# Patient Record
Sex: Male | Born: 1960
Health system: Southern US, Community
[De-identification: ages and names within clinical notes are randomized; demographics above are authoritative.]

## PROBLEM LIST (undated history)

## (undated) DIAGNOSIS — I219 Acute myocardial infarction, unspecified: Secondary | ICD-10-CM

## (undated) DIAGNOSIS — R002 Palpitations: Secondary | ICD-10-CM

## (undated) DIAGNOSIS — T7840XA Allergy, unspecified, initial encounter: Secondary | ICD-10-CM

## (undated) HISTORY — DX: Palpitations: R00.2

## (undated) HISTORY — DX: Allergy, unspecified, initial encounter: T78.40XA

---

## 2017-08-28 ENCOUNTER — Other Ambulatory Visit: Payer: Self-pay

## 2017-08-28 ENCOUNTER — Encounter: Payer: Self-pay | Admitting: Physician Assistant

## 2017-08-28 ENCOUNTER — Ambulatory Visit (INDEPENDENT_AMBULATORY_CARE_PROVIDER_SITE_OTHER): Payer: Self-pay

## 2017-08-28 ENCOUNTER — Ambulatory Visit (INDEPENDENT_AMBULATORY_CARE_PROVIDER_SITE_OTHER): Payer: Self-pay | Admitting: Physician Assistant

## 2017-08-28 VITALS — BP 132/70 | HR 105 | Temp 98.8°F | Resp 18 | Ht 70.0 in | Wt 199.0 lb

## 2017-08-28 DIAGNOSIS — R059 Cough, unspecified: Secondary | ICD-10-CM

## 2017-08-28 DIAGNOSIS — J209 Acute bronchitis, unspecified: Secondary | ICD-10-CM

## 2017-08-28 DIAGNOSIS — R062 Wheezing: Secondary | ICD-10-CM

## 2017-08-28 DIAGNOSIS — Z720 Tobacco use: Secondary | ICD-10-CM

## 2017-08-28 DIAGNOSIS — R05 Cough: Secondary | ICD-10-CM

## 2017-08-28 DIAGNOSIS — Z113 Encounter for screening for infections with a predominantly sexual mode of transmission: Secondary | ICD-10-CM

## 2017-08-28 LAB — POCT CBC
GRANULOCYTE PERCENT: 67.6 % (ref 37–80)
HEMATOCRIT: 56.6 % — AB (ref 43.5–53.7)
Hemoglobin: 18.9 g/dL — AB (ref 14.1–18.1)
Lymph, poc: 2.2 (ref 0.6–3.4)
MCH: 32.6 pg — AB (ref 27–31.2)
MCHC: 33.4 g/dL (ref 31.8–35.4)
MCV: 97.7 fL — AB (ref 80–97)
MID (CBC): 0.3 (ref 0–0.9)
MPV: 7.3 fL (ref 0–99.8)
PLATELET COUNT, POC: 252 10*3/uL (ref 142–424)
POC GRANULOCYTE: 5.3 (ref 2–6.9)
POC LYMPH %: 28.3 % (ref 10–50)
POC MID %: 4.1 %M (ref 0–12)
RBC: 5.8 M/uL (ref 4.69–6.13)
RDW, POC: 13.2 %
WBC: 7.8 10*3/uL (ref 4.6–10.2)

## 2017-08-28 MED ORDER — IPRATROPIUM BROMIDE HFA 17 MCG/ACT IN AERS: 2.0000 | INHALATION_SPRAY | RESPIRATORY_TRACT | 0 refills | Status: DC | PRN

## 2017-08-28 MED ORDER — BENZONATATE 100 MG PO CAPS: 100.0000 mg | ORAL_CAPSULE | Freq: Three times a day (TID) | ORAL | 0 refills | Status: DC | PRN

## 2017-08-28 MED ORDER — IPRATROPIUM BROMIDE 0.02 % IN SOLN
0.5000 mg | Freq: Once | RESPIRATORY_TRACT | Status: AC
  Administered 2017-08-28: 0.5 mg via RESPIRATORY_TRACT

## 2017-08-28 MED ORDER — ALBUTEROL SULFATE HFA 108 (90 BASE) MCG/ACT IN AERS: 2.0000 | INHALATION_SPRAY | RESPIRATORY_TRACT | 1 refills | Status: DC | PRN

## 2017-08-28 MED ORDER — ALBUTEROL SULFATE (2.5 MG/3ML) 0.083% IN NEBU
2.5000 mg | INHALATION_SOLUTION | Freq: Once | RESPIRATORY_TRACT | Status: AC
  Administered 2017-08-28: 2.5 mg via RESPIRATORY_TRACT

## 2017-08-28 MED ORDER — AZITHROMYCIN 250 MG PO TABS: ORAL_TABLET | ORAL | 0 refills | Status: DC

## 2017-08-28 MED ORDER — PREDNISONE 20 MG PO TABS: 40.0000 mg | ORAL_TABLET | Freq: Every day | ORAL | 0 refills | Status: AC

## 2017-08-28 NOTE — Progress Notes (Signed)
MRN: [id number] DOB: 1961/04/03  Subjective:   Barry Gutierrez is a 56 y.o. male presenting for chief complaint of Cough (x about 2 weeks); Nasal Congestion; and Exposure to STD (wants to be tested ) .  Reports 1.5 week history of mostly dry hacking cough. Will occasionally produce phlegm. Has associated runny nose. It all started with sneezing. Has associated wheezing and SOB if he coughs too much.  Denies fever, night sweats, hemoptysis, chest pain, sore throat,  nausea, vomiting, abdominal pain and diarrhea. Has tried mucinex and "over the counter everything" with no full relief. Has had sick contact with girlfriend. Has history of seasonal allergies, no history of asthma or COPD. Patient has not had flu shot this season. Current every day smoker, smokes 2 packs a day x 40 years. Drinks alcohol. Will drink beer and shots every night. Has hx of bronchitis. Denies lower leg swelling, DOE, heart palpitations, recent hospitalizations or travel. No PMH of cancer or heart disease.   Pt would like to be tested for STDs.  He has just recently started dating a new partner and has not had STD testing in over 25 years.  He is asymptomatic.  He has not had any exposures that he is aware of.  Denies penile discharge, penile pain, testicular swelling, testicular pain, rectal pain, dysuria, urinary frequency, and urinary hesitancy.  Kaspar currently has no medications in their medication list. Also has No Known Allergies.  Graycen  has a past medical history of Allergy. Also  has no past surgical history on file.   Objective:   Vitals: BP 132/70   Pulse (!) 105   Temp 98.8 F (37.1 C) (Oral)   Resp 18   Ht 5\' 10"  (1.778 m)   Wt 199 lb (90.3 kg)   SpO2 96%   PF 450 L/min   BMI 28.55 kg/m   Physical Exam  Constitutional: He is oriented to person, place, and time. He appears well-developed and well-nourished. No distress.  HENT:  Head: Normocephalic and atraumatic.  Right Ear: Tympanic membrane,  external ear and ear canal normal.  Left Ear: Tympanic membrane, external ear and ear canal normal.  Nose: Mucosal edema and rhinorrhea present. Right sinus exhibits no maxillary sinus tenderness and no frontal sinus tenderness. Left sinus exhibits no maxillary sinus tenderness and no frontal sinus tenderness.  Mouth/Throat: Uvula is midline and mucous membranes are normal. Posterior oropharyngeal erythema present. No tonsillar exudate.  Eyes: Conjunctivae are normal.  Neck: Normal range of motion.  Pulmonary/Chest: Effort normal. No accessory muscle usage. No respiratory distress. He has no decreased breath sounds. He has wheezes (with forced expiratory breathing). He has rhonchi (diffuse throughout bilateral lung fields, cleared with cough). He has no rales.  Lymphadenopathy:       Head (right side): No submental, no submandibular, no tonsillar, no preauricular, no posterior auricular and no occipital adenopathy present.       Head (left side): No submental, no submandibular, no tonsillar, no preauricular, no posterior auricular and no occipital adenopathy present.    He has no cervical adenopathy.       Right: No supraclavicular adenopathy present.       Left: No supraclavicular adenopathy present.  Neurological: He is alert and oriented to person, place, and time.  Skin: Skin is warm and dry.  Psychiatric: He has a normal mood and affect.  Vitals reviewed.   Results for orders placed or performed in visit on 08/28/17 (from the past 24 hour(s))  POCT CBC     Status: Abnormal   Collection Time: 08/28/17  2:46 PM  Result Value Ref Range   WBC 7.8 4.6 - 10.2 K/uL   Lymph, poc 2.2 0.6 - 3.4   POC LYMPH PERCENT 28.3 10 - 50 %L   MID (cbc) 0.3 0 - 0.9   POC MID % 4.1 0 - 12 %M   POC Granulocyte 5.3 2 - 6.9   Granulocyte percent 67.6 37 - 80 %G   RBC 5.80 4.69 - 6.13 M/uL   Hemoglobin 18.9 (A) 14.1 - 18.1 g/dL   HCT, POC 15.156.6 (A) 76.143.5 - 53.7 %   MCV 97.7 (A) 80 - 97 fL   MCH, POC 32.6  (A) 27 - 31.2 pg   MCHC 33.4 31.8 - 35.4 g/dL   RDW, POC 60.713.2 %   Platelet Count, POC 252 142 - 424 K/uL   MPV 7.3 0 - 99.8 fL    Dg Chest 2 View  Result Date: 08/28/2017 CLINICAL DATA:  Cough, wheezing, shortness of breath EXAM: CHEST  2 VIEW COMPARISON:  None. FINDINGS: Lungs are clear.  No pleural effusion or pneumothorax. The heart is normal in size. Visualized osseous structures are within normal limits. IMPRESSION: Normal chest radiographs. Electronically Signed   By: Charline BillsSriyesh  Krishnan M.D.   On: 08/28/2017 14:41   Post duoneb, rhonchi noted throughout lung field, cleared with cough. Wheezing improved. Peak flow reading is 450, about 83% of predicted. Pt notes he feels less congested post duoneb.   SpO2 while ambulating is 97%. Pulse while ambulating is 102 bpm.   Assessment and Plan :  1. Cough - DG Chest 2 View; Future - POCT CBC - albuterol (PROVENTIL) (2.5 MG/3ML) 0.083% nebulizer solution 2.5 mg - ipratropium (ATROVENT) nebulizer solution 0.5 mg 2. Wheezing Improved with duoneb.  3. Acute bronchitis, unspecified organism Pt is overall well appearing. No distress. His hx and PE findings are consistent with acute bronchitis with associated bronchospasm. Rhonchi noted throughout lung exam but is cleared with cough. Pt improved with duoneb. SpO2 remains at 97% while ambulating. He is mildly tachycardic at 105bpm. WBC nml. Chest xray with no acute findings. Due to extensive smoking hx and intermittent wheezing, will treat with abx and prednisone at this time. Give Rx for albuterol and atrovent inhalers to use every 4-6 hrs prn for wheezing and SOB. Advised to return to clinic if symptoms worsen, do not improve with tx, or as needed. Strongly encouraged pt to discontinue smoking. Also encouraged him to follow up when he is feeling better for PFTs. . - azithromycin (ZITHROMAX) 250 MG tablet; Take 2 tabs PO x 1 dose, then 1 tab PO QD x 4 days  Dispense: 6 tablet; Refill: 0 - predniSONE  (DELTASONE) 20 MG tablet; Take 2 tablets (40 mg total) by mouth daily with breakfast for 5 days.  Dispense: 10 tablet; Refill: 0 - albuterol (PROVENTIL HFA;VENTOLIN HFA) 108 (90 Base) MCG/ACT inhaler; Inhale 2 puffs into the lungs every 4 (four) hours as needed for wheezing or shortness of breath (cough, shortness of breath or wheezing.).  Dispense: 1 Inhaler; Refill: 1 - ipratropium (ATROVENT HFA) 17 MCG/ACT inhaler; Inhale 2 puffs into the lungs every 4 (four) hours as needed for wheezing.  Dispense: 1 Inhaler; Refill: 0 - benzonatate (TESSALON) 100 MG capsule; Take 1-2 capsules (100-200 mg total) by mouth 3 (three) times daily as needed for cough.  Dispense: 40 capsule; Refill: 0  4. Tobacco abuse Counseling given. Pt is  not ready to quit.   5. Screen for STD (sexually transmitted disease) Asymptomatic. Labs pending.  - Hepatitis panel, acute - RPR - HIV antibody - Trichomonas vaginalis, RNA - GC/Chlamydia Probe Amp  Benjiman CoreBrittany Jatinder Mcdonagh, PA-C  Primary Care at [facility] 08/28/2017 3:47 PM

## 2017-08-28 NOTE — Patient Instructions (Addendum)
We are going to treat your infection with both prednisone and an antibiotic. Prednisone is a steroid and can cause side effects such as headache, irritability, nausea, vomiting, increased heart rate, increased blood pressure, increased blood sugar, appetite changes, and insomnia. Please take tablets in the morning with a full meal to help decrease the chances of these side effects.   I have also given you prescription for 2 inhalers.  Please use every 4-6 hours as needed for shortness of breath and wheezing.  I have given you Tessalon Perles to use it daily for cough suppression.  I recommend using over-the-counter Delsym cough syrup at nighttime.  If any of your symptoms worsen or you develop new concerning symptoms, please seek care immediately.  Thank you for letting me participate in your health and well being.  Chronic Obstructive Pulmonary Disease Exacerbation Chronic obstructive pulmonary disease (COPD) is a common lung problem. In COPD, the flow of air from the lungs is limited. COPD exacerbations are times that breathing gets worse and you need extra treatment. Without treatment they can be life threatening. If they happen often, your lungs can become more damaged. If your COPD gets worse, your doctor may treat you with:  Medicines.  Oxygen.  Different ways to clear your airway, such as using a mask.  Follow these instructions at home:  Do not smoke.  Avoid tobacco smoke and other things that bother your lungs.  If given, take your antibiotic medicine as told. Finish the medicine even if you start to feel better.  Only take medicines as told by your doctor.  Drink enough fluids to keep your pee (urine) clear or pale yellow (unless your doctor has told you not to).  Use a cool mist machine (vaporizer).  If you use oxygen or a machine that turns liquid medicine into a mist (nebulizer), continue to use them as told.  Keep up with shots (vaccinations) as told by your  doctor.  Exercise regularly.  Eat healthy foods.  Keep all doctor visits as told. Get help right away if:  You are very short of breath and it gets worse.  You have trouble talking.  You have bad chest pain.  You have blood in your spit (sputum).  You have a fever.  You keep throwing up (vomiting).  You feel weak, or you pass out (faint).  You feel confused.  You keep getting worse. This information is not intended to replace advice given to you by your health care provider. Make sure you discuss any questions you have with your health care provider. Document Released: 08/08/2011 Document Revised: 01/25/2016 Document Reviewed: 04/23/2013 Elsevier Interactive Patient Education  2017 [employer]  Steps to Quit Smoking Smoking tobacco can be bad for your health. It can also affect almost every organ in your body. Smoking puts you and people around you at risk for many serious long-lasting (chronic) diseases. Quitting smoking is hard, but it is one of the best things that you can do for your health. It is never too late to quit. What are the benefits of quitting smoking? When you quit smoking, you lower your risk for getting serious diseases and conditions. They can include:  Lung cancer or lung disease.  Heart disease.  Stroke.  Heart attack.  Not being able to have children (infertility).  Weak bones (osteoporosis) and broken bones (fractures).  If you have coughing, wheezing, and shortness of breath, those symptoms may get better when you quit. You may also get sick less  often. If you are pregnant, quitting smoking can help to lower your chances of having a baby of low birth weight. What can I do to help me quit smoking? Talk with your doctor about what can help you quit smoking. Some things you can do (strategies) include:  Quitting smoking totally, instead of slowly cutting back how much you smoke over a period of time.  Going to in-person counseling. You are  more likely to quit if you go to many counseling sessions.  Using resources and support systems, such as: ? Agricultural engineernline chats with a Veterinary surgeoncounselor. ? Phone quitlines. ? Automotive engineerrinted self-help materials. ? Support groups or group counseling. ? Text messaging programs. ? Mobile phone apps or applications.  Taking medicines. Some of these medicines may have nicotine in them. If you are pregnant or breastfeeding, do not take any medicines to quit smoking unless your doctor says it is okay. Talk with your doctor about counseling or other things that can help you.  Talk with your doctor about using more than one strategy at the same time, such as taking medicines while you are also going to in-person counseling. This can help make quitting easier. What things can I do to make it easier to quit? Quitting smoking might feel very hard at first, but there is a lot that you can do to make it easier. Take these steps:  Talk to your family and friends. Ask them to support and encourage you.  Call phone quitlines, reach out to support groups, or work with a Veterinary surgeoncounselor.  Ask people who smoke to not smoke around you.  Avoid places that make you want (trigger) to smoke, such as: ? Bars. ? Parties. ? Smoke-break areas at work.  Spend time with people who do not smoke.  Lower the stress in your life. Stress can make you want to smoke. Try these things to help your stress: ? Getting regular exercise. ? Deep-breathing exercises. ? Yoga. ? Meditating. ? Doing a body scan. To do this, close your eyes, focus on one area of your body at a time from head to toe, and notice which parts of your body are tense. Try to relax the muscles in those areas.  Download or buy apps on your mobile phone or tablet that can help you stick to your quit plan. There are many free apps, such as QuitGuide from the [employer] developer(Centers for Disease Control and Prevention). You can find more support from smokefree.gov and other websites.  This  information is not intended to replace advice given to you by your health care provider. Make sure you discuss any questions you have with your health care provider. Document Released: 06/15/2009 Document Revised: 04/16/2016 Document Reviewed: 01/03/2015 Elsevier Interactive Patient Education  2018 [employer]     IF you received an x-ray today, you will receive an invoice from [facility]. Please contact [facility] at [phone] with questions or concerns regarding your invoice.   IF you received labwork today, you will receive an invoice from [facility]. Please contact [facility] at [phone] with questions or concerns regarding your invoice.   Our billing staff will not be able to assist you with questions regarding bills from these companies.  You will be contacted with the lab results as soon as they are available. The fastest way to get your results is to activate your My Chart account. Instructions are located on the last page of this paperwork. If you have not heard from us regarding the results in 2  weeks, please contact this office.

## 2017-08-29 LAB — HEPATITIS PANEL, ACUTE
HEP A IGM: NEGATIVE
HEP B S AG: NEGATIVE
Hep B C IgM: NEGATIVE
Hep C Virus Ab: <0.1 s/co ratio (ref 0.0–0.9)

## 2017-08-29 LAB — RPR: RPR Ser Ql: NONREACTIVE

## 2017-08-29 LAB — HIV ANTIBODY (ROUTINE TESTING W REFLEX): HIV SCREEN 4TH GENERATION: NONREACTIVE

## 2017-08-30 LAB — GC/CHLAMYDIA PROBE AMP
Chlamydia trachomatis, NAA: NEGATIVE
Neisseria gonorrhoeae by PCR: NEGATIVE

## 2017-08-30 LAB — TRICHOMONAS VAGINALIS, PROBE AMP: Trich vag by NAA: NEGATIVE

## 2017-09-04 ENCOUNTER — Telehealth: Payer: Self-pay | Admitting: Physician Assistant

## 2017-09-04 NOTE — Telephone Encounter (Signed)
Gave the patient lab results.     He states he is not feeling any better after finishing antibiotic.   Would you like to see him again?

## 2017-09-04 NOTE — Telephone Encounter (Signed)
Copied from CRM #[id number]. Topic: Quick Communication - See Telephone Encounter >> Sep 04, 2017 11:29 AM Clack, Princella PellegriniJessica D wrote: CRM for notification. See Telephone encounter for: Pt calling back for lab results and he states his cough is not getting any better.  09/04/17.

## 2017-09-04 NOTE — Telephone Encounter (Signed)
Please call pt. If he is not feeling any better, he will need to be seen again. I am out of the office until Monday. He can see any of my colleagues on Friday or Saturday.Thanks!

## 2017-09-05 NOTE — Telephone Encounter (Signed)
Left detailed message.   

## 2017-09-10 NOTE — Telephone Encounter (Signed)
Copied from CRM #[id number]. Topic: Inquiry >> Sep 09, 2017  4:32 PM Vivia EwingWalser, Michelle A wrote: Reason for CRM: Pt. Called requesting printed copy of recent negative STD screening. Pt. Requested to pick up copy of printed results from the practice. Please return call pt. To inform him when results are printed and ready for him to pick up.

## 2017-10-14 ENCOUNTER — Other Ambulatory Visit: Payer: Self-pay

## 2017-10-14 ENCOUNTER — Encounter: Payer: Self-pay | Admitting: Family Medicine

## 2017-10-14 ENCOUNTER — Ambulatory Visit (INDEPENDENT_AMBULATORY_CARE_PROVIDER_SITE_OTHER): Payer: 59 | Admitting: Family Medicine

## 2017-10-14 VITALS — BP 118/78 | HR 97 | Temp 98.9°F | Ht 70.0 in | Wt 201.0 lb

## 2017-10-14 DIAGNOSIS — R05 Cough: Secondary | ICD-10-CM

## 2017-10-14 DIAGNOSIS — R062 Wheezing: Secondary | ICD-10-CM | POA: Diagnosis not present

## 2017-10-14 DIAGNOSIS — Z720 Tobacco use: Secondary | ICD-10-CM

## 2017-10-14 DIAGNOSIS — R059 Cough, unspecified: Secondary | ICD-10-CM

## 2017-10-14 MED ORDER — IPRATROPIUM BROMIDE 0.02 % IN SOLN
0.5000 mg | Freq: Once | RESPIRATORY_TRACT | Status: AC
  Administered 2017-10-14: 0.5 mg via RESPIRATORY_TRACT

## 2017-10-14 MED ORDER — ALBUTEROL SULFATE (2.5 MG/3ML) 0.083% IN NEBU
2.5000 mg | INHALATION_SOLUTION | Freq: Once | RESPIRATORY_TRACT | Status: AC
  Administered 2017-10-14: 2.5 mg via RESPIRATORY_TRACT

## 2017-10-14 MED ORDER — PREDNISONE 10 MG PO TABS: ORAL_TABLET | ORAL | 0 refills | Status: AC

## 2017-10-14 NOTE — Patient Instructions (Signed)
     IF you received an x-ray today, you will receive an invoice from [facility]. Please contact [facility] at [phone] with questions or concerns regarding your invoice.   IF you received labwork today, you will receive an invoice from [facility]. Please contact [facility] at [phone] with questions or concerns regarding your invoice.   Our billing staff will not be able to assist you with questions regarding bills from these companies.  You will be contacted with the lab results as soon as they are available. The fastest way to get your results is to activate your My Chart account. Instructions are located on the last page of this paperwork. If you have not heard from us regarding the results in 2 weeks, please contact this office.     

## 2017-10-14 NOTE — Progress Notes (Signed)
2/12/20191:45 PM  Barry Gutierrez June 18, 1961, 57 y.o. male [id number]  Chief Complaint  Patient presents with  . Follow-up    NOT FEELING ANY BETTER FROM LAST VISIT  . Medication Refill    NEEDS ALBUTEROL REFILL    HPI:   Patient is a 57 y.o. male who presents today for continued mostly non productive cough for the past 2 months. He occasionally with cough thick mucous, most of the time dry, wheezing. Reports occasional SOB. Completed abx wo much benefit. Normally has no respiratory issues. Last bronchitis was about 3 years ago. He has occasional sneezing and PND but not constant and none of recent. Denies any fever or chills. Denies heartburn.  Seen on 08/28/17, prescribed azithromycin and tessalon pearls, also given albuterol and ipratropium inhalers Did not get ipratropium as it was too expensive and he did not have insurance He does not feel that albuterol helps much CXR done at that time was normal Smokes 2 ppd  Depression screen [facility] 2/9 10/14/2017 08/28/2017  Decreased Interest 0 0  Down, Depressed, Hopeless 0 0  PHQ - 2 Score 0 0    No Known Allergies  Prior to Admission medications   Medication Sig Start Date End Date Taking? Authorizing Provider  albuterol (PROVENTIL HFA;VENTOLIN HFA) 108 (90 Base) MCG/ACT inhaler Inhale 2 puffs into the lungs every 4 (four) hours as needed for wheezing or shortness of breath (cough, shortness of breath or wheezing.). 08/28/17  Yes Barnett Abu, [place] D, PA-C  ipratropium (ATROVENT HFA) 17 MCG/ACT inhaler Inhale 2 puffs into the lungs every 4 (four) hours as needed for wheezing. 08/28/17  Yes Magdalene River, PA-C    Past Medical History:  Diagnosis Date  . Allergy     History reviewed. No pertinent surgical history.  Social History   Tobacco Use  . Smoking status: Current Every Day Smoker    Types: Cigarettes  . Smokeless tobacco: Never Used  Substance Use Topics  . Alcohol use: Yes    Family History  Problem  Relation Age of Onset  . Cancer Mother   . Diabetes Father     ROS Per hpi  OBJECTIVE:  Blood pressure 118/78, pulse 97, temperature 98.9 F (37.2 C), temperature source Oral, height 5\' 10"  (1.778 m), weight 201 lb (91.2 kg), SpO2 98 %.  Physical Exam  Constitutional: He is oriented to person, place, and time and well-developed, well-nourished, and in no distress.  HENT:  Head: Normocephalic and atraumatic.  Right Ear: Hearing, tympanic membrane, external ear and ear canal normal.  Left Ear: Hearing, tympanic membrane, external ear and ear canal normal.  Mouth/Throat: Oropharynx is clear and moist. No oropharyngeal exudate.  Eyes: Conjunctivae and EOM are normal. Pupils are equal, round, and reactive to light.  Neck: Neck supple.  Cardiovascular: Normal rate and regular rhythm. Exam reveals no gallop and no friction rub.  No murmur heard. Pulmonary/Chest: Effort normal. He has wheezes (diffuse). He has rhonchi (diffuse). He has no rales.  Lymphadenopathy:    He has no cervical adenopathy.  Neurological: He is alert and oriented to person, place, and time. Gait normal.  Skin: Skin is warm and dry.    Office Spirometry Results: PRE Peak Flow: 71 L/min FEV1: 2.66 liters FVC: 3.56 liters FEV1/FVC: 74.7 % FVC  % Predicted: 73 % FEV % Predicted: 71 % FeF 25-75: 2.17 liters FeF 25-75 % Predicted: 67   Office Spirometry Results:  POST Peak Flow: 71 L/min FEV1: 2.41 liters FVC: 3.45 liters  FEV1/FVC: 69.9 % FVC  % Predicted: 70 % FEV % Predicted: 64 % FeF 25-75: 1.79 liters FeF 25-75 % Predicted: 55    ASSESSMENT and PLAN  1. Cough Patient with about 80 pack year history of smoking, not improved after treatment with azithromycin, normal cxr and spirometry suggestive of moderate obstruction however done during time of illness, therefore treating with oral steroids. New meds r/se/b discussed. RTC precautions reviewed.  - albuterol (PROVENTIL) (2.5 MG/3ML) 0.083% nebulizer  solution 2.5 mg - ipratropium (ATROVENT) nebulizer solution 0.5 mg - Care order/instruction:  2. Wheezing - albuterol (PROVENTIL) (2.5 MG/3ML) 0.083% nebulizer solution 2.5 mg - ipratropium (ATROVENT) nebulizer solution 0.5 mg - Care order/instruction:  3. Tobacco abuse Strongly encouraged cutting back/quiting.   Other orders - predniSONE (DELTASONE) 10 MG tablet; Take 4 tablets (40 mg total) by mouth daily with breakfast for 3 days, THEN 2 tablets (20 mg total) daily with breakfast for 3 days, THEN 1 tablet (10 mg total) daily with breakfast for 3 days, THEN 0.5 tablets (5 mg total) daily with breakfast for 3 days.  Return in about 3 weeks (around 11/04/2017).    Myles LippsIrma M Santiago, MD [facility] at [facility] [address] [place] [id number] Ph.  [phone] Fax [phone]

## 2017-10-20 ENCOUNTER — Telehealth: Payer: Self-pay | Admitting: Family Medicine

## 2017-10-20 NOTE — Telephone Encounter (Signed)
Copied from CRM [phone]. Topic: Quick Communication - See Telephone Encounter >> Oct 20, 2017  8:48 AM Guinevere FerrariMorris, Phillp Dolores E, NT wrote: CRM for notification. See Telephone encounter for: Patient called and said he was just seen in the office last week for a cough and is now running a fever.Pt does not know how high his fever is and is not feeling any better and said he can't keep coming in. Patient would like a call back.  10/20/17.

## 2017-10-21 ENCOUNTER — Other Ambulatory Visit: Payer: Self-pay

## 2017-10-21 ENCOUNTER — Ambulatory Visit (INDEPENDENT_AMBULATORY_CARE_PROVIDER_SITE_OTHER): Payer: 59 | Admitting: Family Medicine

## 2017-10-21 ENCOUNTER — Encounter: Payer: Self-pay | Admitting: Family Medicine

## 2017-10-21 VITALS — BP 140/82 | HR 104 | Temp 99.7°F | Ht 71.0 in | Wt 200.0 lb

## 2017-10-21 DIAGNOSIS — Z720 Tobacco use: Secondary | ICD-10-CM

## 2017-10-21 DIAGNOSIS — R509 Fever, unspecified: Secondary | ICD-10-CM

## 2017-10-21 DIAGNOSIS — R05 Cough: Secondary | ICD-10-CM

## 2017-10-21 DIAGNOSIS — R0602 Shortness of breath: Secondary | ICD-10-CM | POA: Diagnosis not present

## 2017-10-21 DIAGNOSIS — R059 Cough, unspecified: Secondary | ICD-10-CM

## 2017-10-21 LAB — POC INFLUENZA A&B (BINAX/QUICKVUE)
Influenza A, POC: NEGATIVE
Influenza B, POC: NEGATIVE

## 2017-10-21 MED ORDER — GUAIFENESIN-CODEINE 100-10 MG/5ML PO SOLN: 5.0000 mL | Freq: Three times a day (TID) | ORAL | 0 refills | Status: DC | PRN

## 2017-10-21 MED ORDER — LEVOFLOXACIN 500 MG PO TABS: 500.0000 mg | ORAL_TABLET | Freq: Every day | ORAL | 0 refills | Status: DC

## 2017-10-21 NOTE — Patient Instructions (Signed)
     IF you received an x-ray today, you will receive an invoice from [facility]. Please contact [facility] at [phone] with questions or concerns regarding your invoice.   IF you received labwork today, you will receive an invoice from [facility]. Please contact [facility] at [phone] with questions or concerns regarding your invoice.   Our billing staff will not be able to assist you with questions regarding bills from these companies.  You will be contacted with the lab results as soon as they are available. The fastest way to get your results is to activate your My Chart account. Instructions are located on the last page of this paperwork. If you have not heard from us regarding the results in 2 weeks, please contact this office.     

## 2017-10-21 NOTE — Telephone Encounter (Signed)
Yes he needs to be re-evaluated. He was first seen for URI sx in late January. He has been treated with azithromycin and prednisone and he is getting worse. I wonder if he has the flu.Marland Kitchen.Marland Kitchen.Marland Kitchen.Thanks

## 2017-10-21 NOTE — Telephone Encounter (Signed)
Do you want to see patient

## 2017-10-21 NOTE — Progress Notes (Signed)
2/19/20193:43 PM  Barry Gutierrez Sep 28, 1960, 57 y.o. male [id number]  Chief Complaint  Patient presents with  . Fever    has not been feeling well for the past 2 months    HPI:   Patient is a 57 y.o. male with past medical history significant for smoking who presents today for non resolving minimally productive cough. He felt feverish 2-3 days ago, today feeling a bit better. But overall cough and SOB have been unchanged despite treatment with oral prednisone and azithromycin. He states that he never gets sick. Last time he had a bronchitis was about 3 years ago, and it did not last this long. He is tired, albuterol not really helping either. Has cut down on smoking since he been coughing so much.   Seen in 10/14/17, treated with prednisone 08/28/17, normal CXR, CBC, peak flow 450 about 83% of predicated, treated with azithyromycin  Depression screen [facility] 2/9 10/21/2017 10/14/2017 08/28/2017  Decreased Interest 0 0 0  Down, Depressed, Hopeless 0 0 0  PHQ - 2 Score 0 0 0    No Known Allergies  Prior to Admission medications   Medication Sig Start Date End Date Taking? Authorizing Provider  albuterol (PROVENTIL HFA;VENTOLIN HFA) 108 (90 Base) MCG/ACT inhaler Inhale 2 puffs into the lungs every 4 (four) hours as needed for wheezing or shortness of breath (cough, shortness of breath or wheezing.). 08/28/17  Yes Barnett Abu, [place] D, PA-C  predniSONE (DELTASONE) 10 MG tablet Take 4 tablets (40 mg total) by mouth daily with breakfast for 3 days, THEN 2 tablets (20 mg total) daily with breakfast for 3 days, THEN 1 tablet (10 mg total) daily with breakfast for 3 days, THEN 0.5 tablets (5 mg total) daily with breakfast for 3 days. 10/14/17 10/26/17 Yes Myles Lipps, MD  ipratropium (ATROVENT HFA) 17 MCG/ACT inhaler Inhale 2 puffs into the lungs every 4 (four) hours as needed for wheezing. Patient not taking: Reported on 10/21/2017 08/28/17   Dwain Sarna    Past Medical  History:  Diagnosis Date  . Allergy     History reviewed. No pertinent surgical history.  Social History   Tobacco Use  . Smoking status: Current Every Day Smoker    Types: Cigarettes  . Smokeless tobacco: Never Used  Substance Use Topics  . Alcohol use: Yes    Family History  Problem Relation Age of Onset  . Cancer Mother   . Diabetes Father     Review of Systems  Constitutional: Positive for fever and malaise/fatigue. Negative for chills and weight loss.  HENT: Negative for ear pain, nosebleeds and sore throat.   Respiratory: Positive for cough, shortness of breath and wheezing. Negative for hemoptysis.   Cardiovascular: Negative for chest pain, palpitations and leg swelling.     OBJECTIVE:  Blood pressure 140/82, pulse (!) 104, temperature 99.7 F (37.6 C), temperature source Oral, height 5\' 11"  (1.803 m), weight 200 lb (90.7 kg), SpO2 96 %.  Physical Exam  Constitutional: He is oriented to person, place, and time and well-developed, well-nourished, and in no distress.  HENT:  Head: Normocephalic and atraumatic.  Right Ear: Hearing, tympanic membrane, external ear and ear canal normal.  Left Ear: Hearing, tympanic membrane, external ear and ear canal normal.  Mouth/Throat: Oropharynx is clear and moist. No oropharyngeal exudate.  Eyes: Conjunctivae and EOM are normal. Pupils are equal, round, and reactive to light.  Neck: Neck supple.  Cardiovascular: Normal rate and regular rhythm. Exam reveals no  gallop and no friction rub.  No murmur heard. Pulmonary/Chest: Effort normal. He has wheezes (diffuse). He has rhonchi (diffuse). He has no rales.  Lymphadenopathy:    He has no cervical adenopathy.  Neurological: He is alert and oriented to person, place, and time. Gait normal.  Skin: Skin is warm and dry.    Results for orders placed or performed in visit on 10/21/17 (from the past 24 hour(s))  POC Influenza A&B(BINAX/QUICKVUE)     Status: Normal   Collection  Time: 10/21/17  3:46 PM  Result Value Ref Range   Influenza A, POC Negative Negative   Influenza B, POC Negative Negative    ASSESSMENT and PLAN  1. Cough Unclear etiology, will escalate abx, ordering ct scan given smoker and refer to pulmonolgy for further evaluation and management.  - Ambulatory referral to Pulmonology - CT Chest Wo Contrast; Future  2. Tobacco abuse - Ambulatory referral to Pulmonology - CT Chest Wo Contrast; Future  3. Fever, unspecified - POC Influenza A&B(BINAX/QUICKVUE)  4. SOB (shortness of breath) - CT Chest Wo Contrast; Future  Other orders - levofloxacin (LEVAQUIN) 500 MG tablet; Take 1 tablet (500 mg total) by mouth daily. - guaiFENesin-codeine 100-10 MG/5ML syrup; Take 5 mLs by mouth 3 (three) times daily as needed for cough.  Return if symptoms worsen or fail to improve.    Myles LippsIrma M Santiago, MD Primary Care at [facility] [address] [place], [place] [address] Ph.  [phone] Fax [phone]

## 2017-11-12 ENCOUNTER — Encounter: Payer: Self-pay | Admitting: Internal Medicine

## 2017-11-12 ENCOUNTER — Ambulatory Visit (INDEPENDENT_AMBULATORY_CARE_PROVIDER_SITE_OTHER): Payer: 59 | Admitting: Internal Medicine

## 2017-11-12 ENCOUNTER — Ambulatory Visit (INDEPENDENT_AMBULATORY_CARE_PROVIDER_SITE_OTHER)
Admission: RE | Admit: 2017-11-12 | Discharge: 2017-11-12 | Disposition: A | Discharge status: Home / Self Care | Payer: 59 | Source: Ambulatory Visit | Attending: Internal Medicine | Admitting: Internal Medicine

## 2017-11-12 VITALS — BP 140/88 | HR 97 | Ht 70.0 in | Wt 200.0 lb

## 2017-11-12 DIAGNOSIS — R05 Cough: Secondary | ICD-10-CM | POA: Diagnosis not present

## 2017-11-12 DIAGNOSIS — F17210 Nicotine dependence, cigarettes, uncomplicated: Secondary | ICD-10-CM

## 2017-11-12 DIAGNOSIS — J4521 Mild intermittent asthma with (acute) exacerbation: Secondary | ICD-10-CM | POA: Diagnosis not present

## 2017-11-12 DIAGNOSIS — J45901 Unspecified asthma with (acute) exacerbation: Secondary | ICD-10-CM | POA: Insufficient documentation

## 2017-11-12 DIAGNOSIS — R058 Other specified cough: Secondary | ICD-10-CM

## 2017-11-12 MED ORDER — FAMOTIDINE 20 MG PO TABS: ORAL_TABLET | ORAL | 2 refills | Status: DC

## 2017-11-12 MED ORDER — AMOXICILLIN-POT CLAVULANATE 875-125 MG PO TABS: 1.0000 | ORAL_TABLET | Freq: Two times a day (BID) | ORAL | 0 refills | Status: AC

## 2017-11-12 MED ORDER — TRAMADOL HCL 50 MG PO TABS: ORAL_TABLET | ORAL | 0 refills | Status: DC

## 2017-11-12 MED ORDER — PREDNISONE 10 MG PO TABS
ORAL_TABLET | ORAL | 0 refills | Status: DC
Start: 2017-11-12 — End: 2018-02-05

## 2017-11-12 MED ORDER — PANTOPRAZOLE SODIUM 40 MG PO TBEC: 40.0000 mg | DELAYED_RELEASE_TABLET | Freq: Every day | ORAL | 2 refills | Status: DC

## 2017-11-12 NOTE — Patient Instructions (Signed)
Saline nasal spray as much as you can   Augmentin 875 mg take one pill twice daily  X 10 days - take at breakfast and supper with large glass of water.  It would help reduce the usual side effects (diarrhea and yeast infections) if you ate cultured yogurt at lunch and took a probiotic over the counter also   Prednisone 10 mg take  4 each am x 2 days,   2 each am x 2 days,  1 each am x 2 days and stop    Take mucinex dm 1200 every 12 hours and supplement if needed with  tramadol 50 mg up to 2 every 4 hours to suppress the urge to cough. Swallowing water or using ice chips/non mint and menthol containing candies (such as lifesavers or sugarless jolly ranchers) are also effective.  You should rest your voice and avoid activities that you know make you cough.  Once you have eliminated the cough for 3 straight days try reducing the tramadol first,  then the mucinex dm as tolerated.     Pantoprazole (protonix) 40 mg   Take  30-60 min before first meal of the day and Pepcid (famotidine)  20 mg one @  bedtime until return to office - this is the best way to tell whether stomach acid is contributing to your problem.    GERD (REFLUX)  is an extremely common cause of respiratory symptoms just like yours , many times with no obvious heartburn at all.    It can be treated with medication, but also with lifestyle changes including elevation of the head of your bed (ideally with 6 inch  bed blocks),  Smoking cessation, avoidance of late meals, excessive alcohol, and avoid fatty foods, chocolate, peppermint, colas, red wine, and acidic juices such as orange juice.  NO MINT OR MENTHOL PRODUCTS SO NO COUGH DROPS   USE SUGARLESS CANDY INSTEAD (Jolley ranchers or Stover's or Life Savers) or even ice chips will also do - the key is to swallow to prevent all throat clearing. NO OIL BASED VITAMINS - use powdered substitutes.   Please remember to go to the  x-ray department downstairs in the basement  for your tests -  we will call you with the results when they are available.      Please schedule a follow up office visit in 2 weeks, sooner if needed

## 2017-11-12 NOTE — Progress Notes (Signed)
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 Only "some better" transiently so referred to pulmonary clinic 11/12/2017 by Dr   Leretha Pol after levaquin 500 x 7 days started on 10/21/17 - finished 10/28/17 and then noted fever again 11/09/17   11/12/2017 1st [facility] office visit/ Wert   Chief Complaint  Patient presents with  . Consult    Productive cough white/tan for 84months,has had a fever but negative flu test. SOB with activity.  Cough waxed and waned since mid Dec 2018 assoc feverish/ chills/ hoarseness/ sore throat  New R ache Cough is worse at hs and in am up to a Tbsp white mucus ? Some plugs  Doe = MMRC2 = can't walk a nl pace on a flat grade s sob but does fine slow and flat      No obvious patterns in  day to day or daytime variability or assoc  mucus plugs or hemoptysis or cp or chest tightness, subjective wheeze or overt sinus or hb symptoms. No unusual exposure hx or h/o childhood pna/ asthma or knowledge of premature birth.    Also denies any obvious fluctuation of symptoms with weather or environmental changes or other aggravating or alleviating factors except as outlined above   Current Allergies, Complete Past Medical History, Past Surgical History, Family History, and Social History were reviewed in [employer] record.  ROS  The following are not active complaints unless bolded Hoarseness, sore throat, dysphagia, dental problems, itching, sneezing,  nasal congestion or discharge of excess mucus or purulent secretions, ear ache,   fever, chills, sweats, unintended wt loss or wt gain, classically pleuritic or exertional cp,  orthopnea pnd or leg swelling, presyncope, palpitations, abdominal pain, anorexia, nausea, vomiting,  diarrhea  or change in bowel habits or change in bladder habits, change in stools or change in urine, dysuria, hematuria,  rash, arthralgias, visual complaints, headache, numbness, weakness or ataxia or problems with walking or coordination,  change in mood/affect or memory.        No outpatient medications have been marked as taking for the 11/12/17 encounter (Office Visit) with Nyoka Cowden, MD.                Review of Systems     Objective:   Physical Exam  slt hoarse wm   Wt Readings from Last 3 Encounters:  11/12/17 200 lb (90.7 kg)  10/21/17 200 lb (90.7 kg)  10/14/17 201 lb (91.2 kg)     Vital signs reviewed - Note on arrival 02 sats  96% on RA          HEENT: nl dentition,   and oropharynx. Nl external ear canals without cough reflex - very severe  bilateral non-specific turbinate edema     NECK :  without JVD/Nodes/TM/ nl carotid upstrokes bilaterally   LUNGS: no acc muscle use,  Nl contour chest with insp/exp rhonchi and prominent pseudowheeze as well better with plm   CV:  RRR  no s3 or murmur or increase in P2, and no edema   ABD:  soft and nontender with nl inspiratory excursion in the supine position. No bruits or organomegaly appreciated, bowel  sounds nl  MS:  Nl gait/ ext warm without deformities, calf tenderness, cyanosis or clubbing No obvious joint restrictions   SKIN: warm and dry without lesions    NEURO:  alert, approp, nl sensorium with  no motor or cerebellar deficits apparent.     CXR PA and Lateral:   11/12/2017 :    I personally reviewed images and agree with radiology impression as follows:    No active cardiopulmonary disease        Assessment:

## 2017-11-12 NOTE — Progress Notes (Signed)
Spoke with pt and notified of results per Dr. Wert. Pt verbalized understanding and denied any questions. 

## 2017-11-13 ENCOUNTER — Encounter: Payer: Self-pay | Admitting: Internal Medicine

## 2017-11-13 DIAGNOSIS — F17210 Nicotine dependence, cigarettes, uncomplicated: Secondary | ICD-10-CM | POA: Insufficient documentation

## 2017-11-13 DIAGNOSIS — R05 Cough: Secondary | ICD-10-CM | POA: Insufficient documentation

## 2017-11-13 DIAGNOSIS — R058 Other specified cough: Secondary | ICD-10-CM | POA: Insufficient documentation

## 2017-11-13 NOTE — Assessment & Plan Note (Signed)
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

## 2017-11-13 NOTE — Assessment & Plan Note (Signed)
Of the three most common causes of  Sub-acute or recurrent or chronic cough, only one (GERD)  can actually contribute to/ trigger  the other two (asthma and post nasal drip syndrome)  and perpetuate the cylce of cough.  While not intuitively obvious, many patients with chronic low grade reflux do not cough until there is a primary insult that disturbs the protective epithelial barrier and exposes sensitive nerve endings.   This is typically viral but can be due to rhinosinusitis with pnds, which may have been the case here.     The point is that once this occurs, it is difficult to eliminate the cycle  using anything but a maximally effective acid suppression regimen at least in the short run, accompanied by an appropriate diet to address non acid GERD and control / eliminate the cough itself for at least 3 days with tramadol    Total time devoted to counseling  > 50 % of initial 60 min office visit:  review case with pt/ discussion of options/alternatives/ personally creating written customized instructions  in presence of pt  then going over those specific  Instructions directly with the pt including how to use all of the meds but in particular covering each new medication in detail and the difference between the maintenance= "automatic" meds and the prns using an action plan format for the latter (If this problem/symptom => do that organization reading Left to right).  Please see AVS from this visit for a full list of these instructions which I personally wrote for this pt and  are unique to this visit.

## 2017-11-13 NOTE — Assessment & Plan Note (Signed)

## 2017-12-08 ENCOUNTER — Ambulatory Visit: Payer: 59 | Admitting: Internal Medicine

## 2018-02-02 ENCOUNTER — Encounter: Payer: Self-pay | Admitting: Physician Assistant

## 2018-02-02 ENCOUNTER — Other Ambulatory Visit: Payer: Self-pay

## 2018-02-02 ENCOUNTER — Ambulatory Visit (INDEPENDENT_AMBULATORY_CARE_PROVIDER_SITE_OTHER): Payer: 59 | Admitting: Physician Assistant

## 2018-02-02 ENCOUNTER — Ambulatory Visit (HOSPITAL_COMMUNITY)
Admission: RE | Admit: 2018-02-02 | Discharge: 2018-02-02 | Disposition: A | Discharge status: Home / Self Care | Payer: 59 | Source: Ambulatory Visit | Attending: Physician Assistant | Admitting: Physician Assistant

## 2018-02-02 ENCOUNTER — Encounter (HOSPITAL_COMMUNITY): Payer: Self-pay

## 2018-02-02 VITALS — BP 133/84 | HR 95 | Temp 99.7°F | Ht 71.0 in | Wt 193.2 lb

## 2018-02-02 DIAGNOSIS — K5732 Diverticulitis of large intestine without perforation or abscess without bleeding: Secondary | ICD-10-CM | POA: Diagnosis not present

## 2018-02-02 DIAGNOSIS — K5792 Diverticulitis of intestine, part unspecified, without perforation or abscess without bleeding: Secondary | ICD-10-CM

## 2018-02-02 DIAGNOSIS — I700 Atherosclerosis of aorta: Secondary | ICD-10-CM | POA: Diagnosis not present

## 2018-02-02 DIAGNOSIS — R1032 Left lower quadrant pain: Secondary | ICD-10-CM

## 2018-02-02 DIAGNOSIS — E278 Other specified disorders of adrenal gland: Secondary | ICD-10-CM

## 2018-02-02 LAB — POCT CBC
Granulocyte percent: 78.9 %G (ref 37–80)
HEMATOCRIT: 53.3 % (ref 43.5–53.7)
HEMOGLOBIN: 18.3 g/dL — AB (ref 14.1–18.1)
Lymph, poc: 2 (ref 0.6–3.4)
MCH: 32.9 pg — AB (ref 27–31.2)
MCHC: 34.4 g/dL (ref 31.8–35.4)
MCV: 96.6 fL (ref 80–97)
MID (CBC): 0.1 (ref 0–0.9)
MPV: 7.3 fL (ref 0–99.8)
POC GRANULOCYTE: 8 — AB (ref 2–6.9)
POC LYMPH PERCENT: 20.2 %L (ref 10–50)
POC MID %: 0.9 %M (ref 0–12)
Platelet Count, POC: 181 10*3/uL (ref 142–424)
RBC: 5.57 M/uL (ref 4.69–6.13)
WBC: 10.1 10*3/uL (ref 4.6–10.2)

## 2018-02-02 LAB — POCT URINALYSIS DIP (MANUAL ENTRY)
BILIRUBIN UA: NEGATIVE
GLUCOSE UA: NEGATIVE mg/dL
Ketones, POC UA: NEGATIVE mg/dL
Leukocytes, UA: NEGATIVE
NITRITE UA: NEGATIVE
Protein Ur, POC: NEGATIVE mg/dL
RBC UA: NEGATIVE
Spec Grav, UA: 1.025 (ref 1.010–1.025)
UROBILINOGEN UA: 0.2 U/dL
pH, UA: 5.5 (ref 5.0–8.0)

## 2018-02-02 MED ORDER — METRONIDAZOLE 500 MG PO TABS: 500.0000 mg | ORAL_TABLET | Freq: Three times a day (TID) | ORAL | 0 refills | Status: AC

## 2018-02-02 MED ORDER — IOHEXOL 300 MG/ML  SOLN
100.0000 mL | Freq: Once | INTRAMUSCULAR | Status: AC | PRN
  Administered 2018-02-02: 100 mL via INTRAVENOUS

## 2018-02-02 MED ORDER — CIPROFLOXACIN HCL 500 MG PO TABS: 500.0000 mg | ORAL_TABLET | Freq: Two times a day (BID) | ORAL | 0 refills | Status: AC

## 2018-02-02 NOTE — Progress Notes (Signed)
Barry Gutierrez  MRN: [id number] DOB: 08/25/1961  Subjective:  Barry Gutierrez is a 57 y.o. male who presents for evaluation of abdominal pain. Onset was 3 days ago. Symptoms have been gradually worsening. The pain is described as dull and achy, and is 7/10 in intensity. Pain is located in the LLQ without radiation.  Aggravating factors: none.  Alleviating factors: none. Associated symptoms: abdominal bloating and fever. The patient denies anorexia, chills, constipation, diarrhea, dysuria, flatus, frequency, headache, hematochezia, hematuria, melena, myalgias, nausea, sweats and vomiting. Drinks alcohol frequently. Denies frequent use of NSAIDs. Smokes daily. Has PMH of c.diff s/p fecal transplant. No PMH of diverticulosis. Last colonscopy in 2017.  No PSH of abdominal surgeries.    Review of Systems  Per HPI  Patient Active Problem List   Diagnosis Date Noted  . Upper airway cough syndrome 11/13/2017  . Cigarette smoker 11/13/2017  . Asthmatic bronchitis with acute exacerbation 11/12/2017    Current Outpatient Medications on File Prior to Visit  Medication Sig Dispense Refill  . famotidine (PEPCID) 20 MG tablet One at bedtime 30 tablet 2  . pantoprazole (PROTONIX) 40 MG tablet Take 1 tablet (40 mg total) by mouth daily. Take 30-60 min before first meal of the day 30 tablet 2  . predniSONE (DELTASONE) 10 MG tablet Take  4 each am x 2 days,   2 each am x 2 days,  1 each am x 2 days and stop 14 tablet 0  . traMADol (ULTRAM) 50 MG tablet 1-2 every 4 hours as needed for cough or pain 40 tablet 0   No current facility-administered medications on file prior to visit.     No Known Allergies    Social History   Socioeconomic History  . Marital status: Legally Separated    Spouse name: Not on file  . Number of children: Not on file  . Years of education: Not on file  . Highest education level: Not on file  Occupational History  . Not on file  Social Needs  . Financial resource  strain: Not on file  . Food insecurity:    Worry: Not on file    Inability: Not on file  . Transportation needs:    Medical: Not on file    Non-medical: Not on file  Tobacco Use  . Smoking status: Current Every Day Smoker    Types: Cigarettes  . Smokeless tobacco: Never Used  Substance and Sexual Activity  . Alcohol use: Yes  . Drug use: No  . Sexual activity: Not Currently  Lifestyle  . Physical activity:    Days per week: Not on file    Minutes per session: Not on file  . Stress: Not on file  Relationships  . Social connections:    Talks on phone: Not on file    Gets together: Not on file    Attends religious service: Not on file    Active member of club or organization: Not on file    Attends meetings of clubs or organizations: Not on file    Relationship status: Not on file  . Intimate partner violence:    Fear of current or ex partner: Not on file    Emotionally abused: Not on file    Physically abused: Not on file    Forced sexual activity: Not on file  Other Topics Concern  . Not on file  Social History Narrative  . Not on file    Objective:  BP 133/84 (BP Location:  Left Arm, Patient Position: Sitting, Cuff Size: Normal)   Pulse 95   Temp 99.7 F (37.6 C) (Oral)   Ht 5\' 11"  (1.803 m)   Wt 193 lb 3.2 oz (87.6 kg)   SpO2 99%   BMI 26.95 kg/m   Physical Exam  Constitutional: He is oriented to person, place, and time. He appears well-developed and well-nourished. No distress.  Appears like he does not feel well.   HENT:  Head: Normocephalic and atraumatic.  Eyes: Conjunctivae are normal.  Neck: Normal range of motion.  Pulmonary/Chest: Effort normal.  Abdominal: Soft. Normal appearance and bowel sounds are normal. There is tenderness in the left lower quadrant. There is guarding. There is no rigidity, no tenderness at McBurney's point and negative Murphy's sign.  Diastasis recti noted  Neurological: He is alert and oriented to person, place, and time.    Skin: Skin is warm and dry.  Psychiatric: He has a normal mood and affect.  Vitals reviewed.     Results for orders placed or performed in visit on 02/02/18 (from the past 24 hour(s))  POCT urinalysis dipstick     Status: Abnormal   Collection Time: 02/02/18 12:28 PM  Result Value Ref Range   Color, UA orange (A) yellow   Clarity, UA clear clear   Glucose, UA negative negative mg/dL   Bilirubin, UA negative negative   Ketones, POC UA negative negative mg/dL   Spec Grav, UA 8.119 1.478 - 1.025   Blood, UA negative negative   pH, UA 5.5 5.0 - 8.0   Protein Ur, POC negative negative mg/dL   Urobilinogen, UA 0.2 0.2 or 1.0 E.U./dL   Nitrite, UA Negative Negative   Leukocytes, UA Negative Negative  POCT CBC     Status: Abnormal   Collection Time: 02/02/18  1:01 PM  Result Value Ref Range   WBC 10.1 4.6 - 10.2 K/uL   Lymph, poc 2.0 0.6 - 3.4   POC LYMPH PERCENT 20.2 10 - 50 %L   MID (cbc) 0.1 0 - 0.9   POC MID % 0.9 0 - 12 %M   POC Granulocyte 8.0 (A) 2 - 6.9   Granulocyte percent 78.9 37 - 80 %G   RBC 5.57 4.69 - 6.13 M/uL   Hemoglobin 18.3 (A) 14.1 - 18.1 g/dL   HCT, POC 29.5 62.1 - 53.7 %   MCV 96.6 80 - 97 fL   MCH, POC 32.9 (A) 27 - 31.2 pg   MCHC 34.4 31.8 - 35.4 g/dL   RDW, POC  %   Platelet Count, POC 181 142 - 424 K/uL   MPV 7.3 0 - 99.8 fL   Ct Abdomen Pelvis W Contrast  Result Date: 02/02/2018 CLINICAL DATA:  Left lower quadrant pain.  Nausea vomiting diarrhea. EXAM: CT ABDOMEN AND PELVIS WITH CONTRAST TECHNIQUE: Multidetector CT imaging of the abdomen and pelvis was performed using the standard protocol following bolus administration of intravenous contrast. CONTRAST:  OMNIPAQUE IOHEXOL 300 MG/ML  SOLN COMPARISON:  None FINDINGS: Lower chest: No acute abnormality. Hepatobiliary: Liver cyst within segment 8 measures 11 mm. No suspicious liver lesions identified. Gallbladder normal. There is no biliary dilatation. Pancreas: Unremarkable. No pancreatic ductal  dilatation or surrounding inflammatory changes. Spleen: Normal in size without focal abnormality. Adrenals/Urinary Tract: Normal appearance of the right adrenal gland. Left adrenal nodule measures 2.1 cm and 24 HU, image 20/8. No hydronephrosis or hydroureter. Urinary bladder appears normal. Stomach/Bowel: Stomach is unremarkable. The small bowel loops have a  normal caliber. The appendix is visualized and appears normal. Distal colonic diverticula noted. Inflamed colonic diverticula is noted within the left lower quadrant of the abdomen a with surrounding fat stranding and a small volume of free fluid. There is abnormal wall thickening involving a segment of sigmoid colon measuring approximately 11 cm. Vascular/Lymphatic: Aortic atherosclerosis. No aneurysm. No adenopathy within the abdomen. No pelvic or inguinal adenopathy. Reproductive: Prostate is unremarkable. Other: No abdominal wall hernia or abnormality. No abdominopelvic ascites. Musculoskeletal: No acute or significant osseous findings. IMPRESSION: 1. Exam positive for acute sigmoid diverticulitis. No significant pneumoperitoneum. No abscess identified. 2.  Aortic Atherosclerosis (ICD10-I70.0). 3. Indeterminate left adrenal nodule measuring 2.1 cm. Suggest further evaluation with adrenal protocol MRI or CT. Electronically Signed   By: Signa Kellaylor  Stroud M.D.   On: 02/02/2018 17:27    Assessment and Plan :  1. Diverticulitis Update: Pt contacted via phone and informed that CT abdomen was positive for diverticulitis without abscess or perforation.  Pt is a candidate for outpt management. WBC wnl. Temp 99.7. Recommended clear liquid diet and starting abx today. F/u in office in 3 days for reevaluation. Return sooner if sx worsen of he develops new concerning sx.  - ciprofloxacin (CIPRO) 500 MG tablet; Take 1 tablet (500 mg total) by mouth 2 (two) times daily for 7 days.  Dispense: 14 tablet; Refill: 0 - metroNIDAZOLE (FLAGYL) 500 MG tablet; Take 1 tablet  (500 mg total) by mouth 3 (three) times daily for 7 days. DO NOT CONSUME ALCOHOL WHILE TAKING THIS MEDICATION.  Dispense: 21 tablet; Refill: 0  2. Left lower quadrant pain - POCT urinalysis dipstick - POCT CBC - CT Abdomen Pelvis W Contrast; Future - Comprehensive metabolic panel; Future prehensive metabolic panel; Future  3. Adrenal incidentaloma (HCC) Plan to discuss finding with pt in office at f/u visit in 3 days. - CT ADRENAL ABDOMEN WO CONTRAST; Future  Side effects, risks, benefits, and alternatives of the medications and treatment plan prescribed today were discussed, and patient expressed understanding of the instructions given. No barriers to understanding were identified. Red flags discussed in detail. Pt expressed understanding regarding what to do in case of emergency/urgent symptoms.  Benjiman CoreBrittany Genevia Bouldin PA-C  [facility] Care at [facility]  [facility] 02/02/2018 1:07 PM

## 2018-02-02 NOTE — Patient Instructions (Addendum)
Your CT exam check in time is at 2:30pm [facility] on [address], [place], [place] [address].   Please check in at the front desk at admitting. You will need a blood draw before your exam. You are to start drinking the contrast as soon as you get to imaging.     IF you received an x-ray today, you will receive an invoice from [facility]. Please contact [facility] at [phone] with questions or concerns regarding your invoice.   IF you received labwork today, you will receive an invoice from [facility]. Please contact [facility] at [phone] with questions or concerns regarding your invoice.   Our billing staff will not be able to assist you with questions regarding bills from these companies.  You will be contacted with the lab results as soon as they are available. The fastest way to get your results is to activate your My Chart account. Instructions are located on the last page of this paperwork. If you have not heard from us regarding the results in 2 weeks, please contact this office.

## 2018-02-05 ENCOUNTER — Other Ambulatory Visit: Payer: Self-pay

## 2018-02-05 ENCOUNTER — Encounter: Payer: Self-pay | Admitting: Physician Assistant

## 2018-02-05 ENCOUNTER — Ambulatory Visit (INDEPENDENT_AMBULATORY_CARE_PROVIDER_SITE_OTHER): Payer: 59 | Admitting: Physician Assistant

## 2018-02-05 VITALS — BP 130/84 | HR 94 | Temp 99.4°F | Resp 16 | Ht 70.0 in | Wt 196.2 lb

## 2018-02-05 DIAGNOSIS — E278 Other specified disorders of adrenal gland: Secondary | ICD-10-CM

## 2018-02-05 DIAGNOSIS — K5792 Diverticulitis of intestine, part unspecified, without perforation or abscess without bleeding: Secondary | ICD-10-CM | POA: Diagnosis not present

## 2018-02-05 DIAGNOSIS — K5730 Diverticulosis of large intestine without perforation or abscess without bleeding: Secondary | ICD-10-CM | POA: Diagnosis not present

## 2018-02-05 NOTE — Patient Instructions (Addendum)
Continue with clear liquid diet until symptoms fully resolve then advance diet as tolerated. Return to clinic if symptoms worsen, do not improve once abx complete, or as needed.    Diverticulitis Diverticulitis is infection or inflammation of small pouches (diverticula) in the colon that form due to a condition called diverticulosis. Diverticula can trap stool (feces) and bacteria, causing infection and inflammation. Diverticulitis may cause severe stomach pain and diarrhea. It may lead to tissue damage in the colon that causes bleeding. The diverticula may also burst (rupture) and cause infected stool to enter other areas of the abdomen. Complications of diverticulitis can include:  Bleeding.  Severe infection.  Severe pain.  Rupture (perforation) of the colon.  Blockage (obstruction) of the colon.  What are the causes? This condition is caused by stool becoming trapped in the diverticula, which allows bacteria to grow in the diverticula. This leads to inflammation and infection. What increases the risk? You are more likely to develop this condition if:  You have diverticulosis. The risk for diverticulosis increases if: ? You are overweight or obese. ? You use tobacco products. ? You do not get enough exercise.  You eat a diet that does not include enough fiber. High-fiber foods include fruits, vegetables, beans, nuts, and whole grains.  What are the signs or symptoms? Symptoms of this condition may include:  Pain and tenderness in the abdomen. The pain is normally located on the left side of the abdomen, but it may occur in other areas.  Fever and chills.  Bloating.  Cramping.  Nausea.  Vomiting.  Changes in bowel routines.  Blood in your stool.  How is this diagnosed? This condition is diagnosed based on:  Your medical history.  A physical exam.  Tests to make sure there is nothing else causing your condition. These tests may include: ? Blood  tests. ? Urine tests. ? Imaging tests of the abdomen, including X-rays, ultrasounds, MRIs, or CT scans.  How is this treated? Most cases of this condition are mild and can be treated at home. Treatment may include:  Taking over-the-counter pain medicines.  Following a clear liquid diet.  Taking antibiotic medicines by mouth.  Rest.  More severe cases may need to be treated at a hospital. Treatment may include:  Not eating or drinking.  Taking prescription pain medicine.  Receiving antibiotic medicines through an IV tube.  Receiving fluids and nutrition through an IV tube.  Surgery.  When your condition is under control, your health care provider may recommend that you have a colonoscopy. This is an exam to look at the entire large intestine. During the exam, a lubricated, bendable tube is inserted into the anus and then passed into the rectum, colon, and other parts of the large intestine. A colonoscopy can show how severe your diverticula are and whether something else may be causing your symptoms. Follow these instructions at home: Medicines  Take over-the-counter and prescription medicines only as told by your health care provider. These include fiber supplements, probiotics, and stool softeners.  If you were prescribed an antibiotic medicine, take it as told by your health care provider. Do not stop taking the antibiotic even if you start to feel better.  Do not drive or use heavy machinery while taking prescription pain medicine. General instructions  Follow a full liquid diet or another diet as directed by your health care provider. After your symptoms improve, your health care provider may tell you to change your diet. He or she may recommend  that you eat a diet that contains at least 25 g (25 grams) of fiber daily. Fiber makes it easier to pass stool. Healthy sources of fiber include: ? Berries. One cup contains 4-8 grams of fiber. ? Beans or lentils. One half cup  contains 5-8 grams of fiber. ? Green vegetables. One cup contains 4 grams of fiber.  Exercise for at least 30 minutes, 3 times each week. You should exercise hard enough to raise your heart rate and break a sweat.  Keep all follow-up visits as told by your health care provider. This is important. You may need a colonoscopy. Contact a health care provider if:  Your pain does not improve.  You have a hard time drinking or eating food.  Your bowel movements do not return to normal. Get help right away if:  Your pain gets worse.  Your symptoms do not get better with treatment.  Your symptoms suddenly get worse.  You have a fever.  You vomit more than one time.  You have stools that are bloody, black, or tarry. Summary  Diverticulitis is infection or inflammation of small pouches (diverticula) in the colon that form due to a condition called diverticulosis. Diverticula can trap stool (feces) and bacteria, causing infection and inflammation.  You are at higher risk for this condition if you have diverticulosis and you eat a diet that does not include enough fiber.  Most cases of this condition are mild and can be treated at home. More severe cases may need to be treated at a hospital.  When your condition is under control, your health care provider may recommend that you have an exam called a colonoscopy. This exam can show how severe your diverticula are and whether something else may be causing your symptoms. This information is not intended to replace advice given to you by your health care provider. Make sure you discuss any questions you have with your health care provider. Document Released: 05/29/2005 Document Revised: 09/21/2016 Document Reviewed: 09/21/2016 Elsevier Interactive Patient Education  2018 [employer].   IF you received an x-ray today, you will receive an invoice from [facility]. Please contact [facility] at [phone] with questions or  concerns regarding your invoice.   IF you received labwork today, you will receive an invoice from [place]. Please contact [facility] at [phone] with questions or concerns regarding your invoice.   Our billing staff will not be able to assist you with questions regarding bills from these companies.  You will be contacted with the lab results as soon as they are available. The fastest way to get your results is to activate your My Chart account. Instructions are located on the last page of this paperwork. If you have not heard from [place] regarding the results in 2 weeks, please contact this office.

## 2018-02-05 NOTE — Progress Notes (Signed)
Barry GinDavid L Gutierrez  MRN: [id number] DOB: September 22, 1960  Subjective:  Barry GinDavid L Gutierrez is a 57 y.o. male seen in office today for a chief complaint of f/u on diverticulitis. Please see OV note from 02/02/18 for additional details. Since last visit, pt has been taking cipro and flagyl, tolerating well. He has been eating a clear liquid diet, did have some fish last night. Reports being about 70% better today. Still has some discomfort in LLQ but much improved. Fever has resolved. Denies nausea, vomiting, diarrhea, constipation, chills, and diaphoresis. He is drinking water appropriately. Avoiding alcohol intake.  Review of Systems Per HPI  Patient Active Problem List   Diagnosis Date Noted  . Upper airway cough syndrome 11/13/2017  . Cigarette smoker 11/13/2017  . Asthmatic bronchitis with acute exacerbation 11/12/2017    Current Outpatient Medications on File Prior to Visit  Medication Sig Dispense Refill  . ciprofloxacin (CIPRO) 500 MG tablet Take 1 tablet (500 mg total) by mouth 2 (two) times daily for 7 days. 14 tablet 0  . metroNIDAZOLE (FLAGYL) 500 MG tablet Take 1 tablet (500 mg total) by mouth 3 (three) times daily for 7 days. DO NOT CONSUME ALCOHOL WHILE TAKING THIS MEDICATION. 21 tablet 0  . famotidine (PEPCID) 20 MG tablet One at bedtime (Patient not taking: Reported on 02/05/2018) 30 tablet 2  . pantoprazole (PROTONIX) 40 MG tablet Take 1 tablet (40 mg total) by mouth daily. Take 30-60 min before first meal of the day (Patient not taking: Reported on 02/05/2018) 30 tablet 2  . traMADol (ULTRAM) 50 MG tablet 1-2 every 4 hours as needed for cough or pain (Patient not taking: Reported on 02/05/2018) 40 tablet 0   No current facility-administered medications on file prior to visit.     No Known Allergies   Objective:  BP 130/84 (BP Location: Right Arm, Patient Position: Sitting, Cuff Size: Normal)   Pulse 94   Temp 99.4 F (37.4 C) (Oral)   Resp 16   Ht 5\' 10"  (1.778 m)   Wt 196 lb  3.2 oz (89 kg)   SpO2 93%   BMI 28.15 kg/m   Physical Exam  Constitutional: He is oriented to person, place, and time. He appears well-developed and well-nourished. No distress.  HENT:  Head: Normocephalic and atraumatic.  Eyes: Conjunctivae are normal.  Neck: Normal range of motion.  Pulmonary/Chest: Effort normal.  Abdominal: Soft. Normal appearance and bowel sounds are normal. There is tenderness (mild TTP) in the left lower quadrant. There is no rigidity and no guarding.  Neurological: He is alert and oriented to person, place, and time.  Skin: Skin is warm and dry.  Psychiatric: He has a normal mood and affect.  Vitals reviewed.   Assessment and Plan :  1. Diverticulitis 2. Diverticulosis of colon Pt is responding appropriately to outpt management. He is overall well appearing, NAD. There is mild TTP in LLQ but no guarding. Recommend continuing with clear liquid diet and advancing diet as tolerated once he is completely asymptomatic. Given educational material on diverticulosis in general as this was his first occurrence. Educated on proper dietary measures to take to help decrease risk of future recurrence. Also given educational material to read. Advised to  return to clinic if symptoms worsen, do not improve once abx complete, or as needed.  3. Adrenal incidentaloma [facility](H[facility]) Discussed CT findings. Pt has no further questions. Ct adrenal abdomen wo contrast study ordered.   A total of 15 mintues was spent in the  room with the patient, greater than 50% of which was in counseling/coordination of care regarding diverticulosis.   Benjiman Core PA-C  Primary Care at [facility] 02/05/2018 11:05 AM

## 2018-02-13 ENCOUNTER — Telehealth: Payer: Self-pay | Admitting: Physician Assistant

## 2018-02-13 NOTE — Telephone Encounter (Signed)
error 

## 2018-02-14 ENCOUNTER — Telehealth: Payer: Self-pay | Admitting: Family Medicine

## 2018-02-14 NOTE — Telephone Encounter (Signed)
[employer] approved CT abdomin and pelvis w contrast.  Valid 02/02/18-03/19/18

## 2018-02-20 ENCOUNTER — Telehealth: Payer: Self-pay | Admitting: Family Medicine

## 2018-02-20 NOTE — Telephone Encounter (Signed)
Reviewing pending authorizations and [facility] Chest W/O Contrast for pt

## 2018-02-24 NOTE — Telephone Encounter (Signed)
Noted. Normal cxr in march 2019

## 2018-03-03 ENCOUNTER — Telehealth: Payer: Self-pay | Admitting: Physician Assistant

## 2018-03-03 NOTE — Telephone Encounter (Signed)
Pt's CT Scan has been denied with insurance. They stated it must be shown in pt's record that a a CT scan of the belly w/ and w/o dye is needed. If provider does not agree with this decision, a peer to peer can be done by calling [phone].

## 2018-03-07 NOTE — Telephone Encounter (Signed)
Can you please clarify with insurance that this is needed even though the CT abdomen w/ contrast showed :   IMPRESSION: 1. Exam positive for acute sigmoid diverticulitis. No significant pneumoperitoneum. No abscess identified. 2.  Aortic Atherosclerosis (ICD10-I70.0). 3. Indeterminate left adrenal nodule measuring 2.1 cm. Suggest further evaluation with adrenal protocol MRI or CT.  If they still recommend the other CT, I would like to do a peer to peer thanks!

## 2018-03-09 NOTE — Telephone Encounter (Signed)
[facility] and they said the recommended study is a CT Abdomen w and w/o contrast. I asked if I could provide additional clinicals for the CT Abdomen W/O Contrast, but they said it is not certified. They stated ordering the CT Abdomen W/ and W/O is recommended rather than doing the peer to peer. Please advise. Thanks!

## 2018-03-10 ENCOUNTER — Other Ambulatory Visit: Payer: Self-pay | Admitting: Physician Assistant

## 2018-03-10 DIAGNOSIS — E278 Other specified disorders of adrenal gland: Secondary | ICD-10-CM

## 2018-03-10 NOTE — Telephone Encounter (Signed)
Okay I have placed this order. Please let me know if there is anything else I need to do.

## 2018-03-10 NOTE — Progress Notes (Signed)
Orders Placed This Encounter  Procedures  . CT ABDOMEN W WO CONTRAST    Standing Status:   Future    Standing Expiration Date:   06/11/2019    Order Specific Question:   ** REASON FOR EXAM (FREE TEXT)    Answer:   Indeterminate left adrenal nodule measuring 2.1 cm fourn on CT w/ contrast    Order Specific Question:   If indicated for the ordered procedure, I authorize the administration of contrast media per Radiology protocol    Answer:   Yes    Order Specific Question:   Preferred imaging location?    Answer:   External    Order Specific Question:   Is Oral Contrast requested for this exam?    Answer:   Yes, Per Radiology protocol    Order Specific Question:   Radiology Contrast Protocol - do NOT remove file path    Answer:   [email or web address]

## 2018-03-19 ENCOUNTER — Telehealth: Payer: Self-pay | Admitting: Physician Assistant

## 2018-03-19 NOTE — Telephone Encounter (Signed)
Copied from CRM [id number]. Topic: General - Other >> Mar 19, 2018  1:30 PM Mcneil, Ja-Kwan wrote: Reason for CRM: Pt states he has not received a call in regards to the CT scan. Pt requests a call back. Cb# [phone]

## 2018-03-24 NOTE — Telephone Encounter (Signed)
Please advise 

## 2018-03-24 NOTE — Telephone Encounter (Signed)
Lu DuffelHey Annie, I know we had to change the order.  Could you please call the patient and let him know how long this should take since we have placed a new order. Thanks!

## 2018-03-25 NOTE — Telephone Encounter (Signed)
Pt is [facility] 04[facility]06/18 [facility] 11[facility]

## 2018-04-03 ENCOUNTER — Other Ambulatory Visit: Payer: 59

## 2018-04-06 ENCOUNTER — Ambulatory Visit
Admission: RE | Admit: 2018-04-06 | Discharge: 2018-04-06 | Disposition: A | Discharge status: Home / Self Care | Payer: 59 | Source: Ambulatory Visit | Attending: Physician Assistant | Admitting: Physician Assistant

## 2018-04-06 DIAGNOSIS — E278 Other specified disorders of adrenal gland: Secondary | ICD-10-CM

## 2018-04-06 MED ORDER — IOPAMIDOL (ISOVUE-300) INJECTION 61%
100.0000 mL | Freq: Once | INTRAVENOUS | Status: AC | PRN
  Administered 2018-04-06: 100 mL via INTRAVENOUS

## 2018-04-08 ENCOUNTER — Encounter: Payer: Self-pay | Admitting: Physician Assistant

## 2018-04-08 ENCOUNTER — Other Ambulatory Visit: Payer: Self-pay

## 2018-04-08 ENCOUNTER — Ambulatory Visit (INDEPENDENT_AMBULATORY_CARE_PROVIDER_SITE_OTHER): Payer: 59 | Admitting: Physician Assistant

## 2018-04-08 VITALS — BP 130/84 | HR 106 | Temp 98.9°F | Resp 16 | Ht 70.0 in | Wt 189.6 lb

## 2018-04-08 DIAGNOSIS — H60501 Unspecified acute noninfective otitis externa, right ear: Secondary | ICD-10-CM | POA: Diagnosis not present

## 2018-04-08 DIAGNOSIS — L304 Erythema intertrigo: Secondary | ICD-10-CM | POA: Diagnosis not present

## 2018-04-08 MED ORDER — NYSTATIN 100000 UNIT/GM EX POWD: Freq: Four times a day (QID) | CUTANEOUS | 0 refills | Status: DC

## 2018-04-08 MED ORDER — CLOTRIMAZOLE 1 % EX CREA: 1.0000 "application " | TOPICAL_CREAM | Freq: Two times a day (BID) | CUTANEOUS | 0 refills | Status: DC

## 2018-04-08 MED ORDER — NEOMYCIN-POLYMYXIN-HC 1 % OT SOLN: 3.0000 [drp] | Freq: Four times a day (QID) | OTIC | 0 refills | Status: DC

## 2018-04-08 NOTE — Patient Instructions (Addendum)
  TREATMENT - Practices aimed at minimizing moisture and friction in the involved area and reducing susceptibility to intertrigo are the mainstays of treatment. Typical beneficial practices include:   ?Daily cleansing of affected skin with a mild cleanser followed by drying of affected area with a hair dryer on a cool setting ?Aeration of affected area when feasible ?Daily application of drying powders ?Use of absorbent material or clothing, such as cotton or merino wool, to separate skin in folds ?Application of barrier creams in areas that may come in contact with urine or feces ?Treatment of hyperhidrosis in the affected area ?Weight loss in overweight or obese patients ?Appropriate treatment of coexisting diabetes mellitus   In addition to these measures, I recommend clotrimazole cream applied 1-2 x per day for 2-4 weeks. You can also use over the counter hydrocortisone cream twice daily for one week, once daily for one week, and every other day for one week.I have also given you a powder to use up to twice a day to help dry the area out. If no full improvement in 2 weeks, let me know.  Your ear infection is suspicious for outer ear infection. Use ear drops as prescribed.  Avoid swimming while being treated.  To use the ear drops: -Lie down or tilt your head with your ear facing upward. Open the ear canal by gently pulling your ear back, or pulling downward on the earlobe when giving this medicine to a child. -Hold the dropper upside down over your ear and drop the correct number of drops into the ear. -Stay lying down or with your head tilted for at least 5 minutes. You may use a small piece of cotton to plug the ear and keep the medicine from draining out. -Do not touch the dropper tip or place it directly in your ear. It may become contaminated. Wipe the tip with a clean tissue but do not wash with water or soap. -Use this medicine for the full prescribed length of time. Your symptoms may  improve before the infection is completely cleared. Skipping doses may also increase your risk of further infection that is resistant to antibiotics.      IF you received an x-ray today, you will receive an invoice from [facility]. Please contact [facility] at [phone] with questions or concerns regarding your invoice.   IF you received labwork today, you will receive an invoice from [facility]. Please contact [facility] at [phone] with questions or concerns regarding your invoice.   Our billing staff will not be able to assist you with questions regarding bills from these companies.  You will be contacted with the lab results as soon as they are available. The fastest way to get your results is to activate your My Chart account. Instructions are located on the last page of this paperwork. If you have not heard from us regarding the results in 2 weeks, please contact this office.

## 2018-04-08 NOTE — Progress Notes (Signed)
Barry GinDavid L Grange  MRN: [id number] DOB: [birth year]  Subjective:  Barry Gutierrez is a 57 y.o. male seen in office today for a chief complaint of rash involving the bilateral groin. Rash started 2 weeks ago. Lesions are red and blotchy. Rash has not changed over time. Rash is pruritic. Denies abdominal pain, arthralgia, congestion, cough, decrease in appetite, decrease in energy level, fever, headache, irritability, myalgia, nausea, sore throat and vomiting. Patient has not had contacts with similar rash. Patient has not had new exposures (soaps, lotions, laundry detergents, foods, medications, plants, insects or animals).He is in a car all day long and notes he has been sweating a lot.  Tried gold bond and eczema cream. No PMH of diabetes.   Also reports 3 weeks history of intermitent right ear pain. Feels full and "like I am in a tunnel."  Has been spending lots of time in pools recently. Denies fever, tinnitus, dizziness, sinus pain, ear drainage, and sore throat.  Uses Q-tips frequently.   Review of Systems  Per HPI  Patient Active Problem List   Diagnosis Date Noted  . Diverticulosis of colon 02/05/2018  . Upper airway cough syndrome 11/13/2017  . Cigarette smoker 11/13/2017  . Asthmatic bronchitis with acute exacerbation 11/12/2017    Current Outpatient Medications on File Prior to Visit  Medication Sig Dispense Refill  . famotidine (PEPCID) 20 MG tablet One at bedtime (Patient not taking: Reported on 02/05/2018) 30 tablet 2  . pantoprazole (PROTONIX) 40 MG tablet Take 1 tablet (40 mg total) by mouth daily. Take 30-60 min before first meal of the day (Patient not taking: Reported on 02/05/2018) 30 tablet 2  . traMADol (ULTRAM) 50 MG tablet 1-2 every 4 hours as needed for cough or pain (Patient not taking: Reported on 02/05/2018) 40 tablet 0   No current facility-administered medications on file prior to visit.     No Known Allergies    Social History   Socioeconomic History  .  Marital status: Legally Separated    Spouse name: Not on file  . Number of children: Not on file  . Years of education: Not on file  . Highest education level: Not on file  Occupational History  . Not on file  Social Needs  . Financial resource strain: Not on file  . Food insecurity:    Worry: Not on file    Inability: Not on file  . Transportation needs:    Medical: Not on file    Non-medical: Not on file  Tobacco Use  . Smoking status: Current Every Day Smoker    Types: Cigarettes  . Smokeless tobacco: Never Used  Substance and Sexual Activity  . Alcohol use: Yes  . Drug use: No  . Sexual activity: Not Currently  Lifestyle  . Physical activity:    Days per week: Not on file    Minutes per session: Not on file  . Stress: Not on file  Relationships  . Social connections:    Talks on phone: Not on file    Gets together: Not on file    Attends religious service: Not on file    Active member of club or organization: Not on file    Attends meetings of clubs or organizations: Not on file    Relationship status: Not on file  . Intimate partner violence:    Fear of current or ex partner: Not on file    Emotionally abused: Not on file    Physically abused: Not on  file    Forced sexual activity: Not on file  Other Topics Concern  . Not on file  Social History Narrative  . Not on file    Objective:  BP 130/84   Pulse (!) 106   Temp 98.9 F (37.2 C)   Resp 16   Ht 5\' 10"  (1.778 m)   Wt 189 lb 9.6 oz (86 kg)   SpO2 96%   BMI 27.20 kg/m   Physical Exam  Constitutional: He is oriented to person, place, and time. He appears well-developed and well-nourished.  HENT:  Head: Normocephalic and atraumatic.  Right Ear: Tympanic membrane normal. There is swelling (Ear canal is moderately edematous and erythematous, mild amount of flaky debris noted.  No focal spores noted.) and tenderness (with pressure applied to tragus). Tympanic membrane is not perforated. No middle ear  effusion.  Left Ear: Tympanic membrane, external ear and ear canal normal.  Eyes: Conjunctivae are normal.  Neck: Normal range of motion.  Pulmonary/Chest: Effort normal.  Lymphadenopathy:       Head (right side): No preauricular and no posterior auricular adenopathy present.       Head (left side): No preauricular and no posterior auricular adenopathy present.  Neurological: He is alert and oriented to person, place, and time.  Skin: Skin is warm and dry.  Erythematous macular rash with slight maceration of inguinal folds bilaterally, satellite lesions noted.    Psychiatric: He has a normal mood and affect.  Vitals reviewed.   Assessment and Plan :  1. Acute otitis externa of right ear, unspecified type History of [place] findings consistent with externa.  Given Rx for eardrop.  Recommend avoiding swimming until sx resolve.  Given education material on how to use eardrops appropriately.  Advised to return to clinic if symptoms worsen, do not improve, or as needed.  - NEOMYCIN-POLYMYXIN-HYDROCORTISONE (CORTISPORIN) 1 % SOLN OTIC solution; Place 3 drops into the right ear 4 (four) times daily.  Dispense: 10 mL; Refill: 0  2. Intertrigo History and physical exam findings consistent with candidal intertrigo.  Educated on general measures of treatment for this condition.  Recommended topical Lotrimin cream.  May use over-the-counter hydrocortisone cream for itching.  Given Rx for nystatin powder as well.  Encouraged to contact our office if no improvement after 1 week, can consider oral Diflucan at that time. - clotrimazole (LOTRIMIN) 1 % cream; Apply 1 application topically 2 (two) times daily.  Dispense: 30 g; Refill: 0 - nystatin (MYCOSTATIN/NYSTOP) powder; Apply topically 4 (four) times daily.  Dispense: 30 g; Refill: 0   Benjiman Core PA-C  Primary Care at [facility] 04/08/2018 12:03 PM

## 2018-04-09 ENCOUNTER — Other Ambulatory Visit: Payer: Self-pay | Admitting: Physician Assistant

## 2018-04-09 ENCOUNTER — Encounter: Payer: Self-pay | Admitting: Physician Assistant

## 2018-04-09 DIAGNOSIS — R918 Other nonspecific abnormal finding of lung field: Secondary | ICD-10-CM

## 2018-04-09 NOTE — Progress Notes (Signed)
Spoke with pt and informed him a CT was ordered and that he needs to make an appointment to follow-up for a CPE in the next two months. He verbalized understanding.

## 2018-04-09 NOTE — Progress Notes (Signed)
Orders Placed This Encounter  Procedures  . CT CHEST NODULE FOLLOW UP LOW DOSE W/O     Please call patient let him know that I did put an order for CT of the chest in 12 months.  In the meantime, I do recommend he follow-up for complete physical exam in the next few months so we can check his basic labs.  He should be fasting at this visit.  No food for at least 8 hours.

## 2019-05-30 IMAGING — CT CT ABD-PELV W/ CM
2 of 5 series · 16 of 46 positions shown, 18 images · IV contrast (Omni 300)
Comparison: None

CLINICAL DATA: Left lower quadrant pain.  Nausea vomiting diarrhea.

EXAM:
CT ABDOMEN AND PELVIS WITH CONTRAST
TECHNIQUE: Multidetector CT imaging of the abdomen and pelvis was performed
using the standard protocol following bolus administration of
intravenous contrast.
CONTRAST:  100mL OMNIPAQUE IOHEXOL 300 MG/ML  SOLN

[Series 5: a/p w/ cor · coronal · 0.70mm/px · 3 of 151 slices shown]
[im 51/151  soft-tissue]
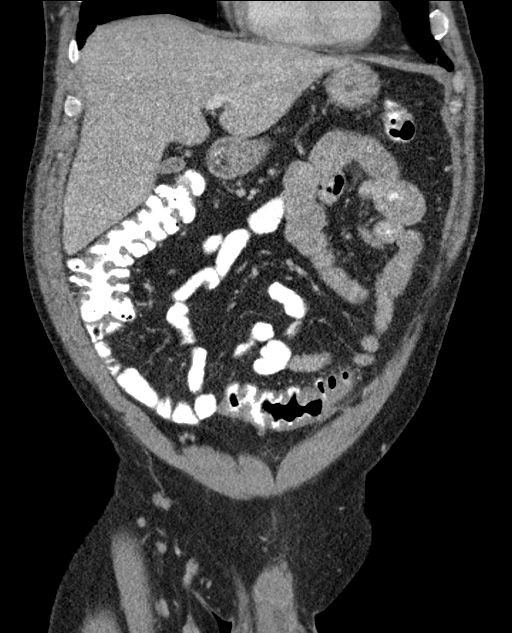
[im 67/151  soft-tissue]
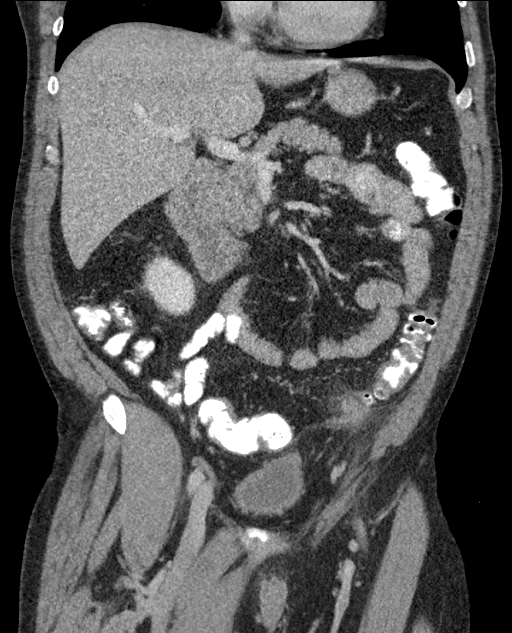
[im 84/151  soft-tissue]
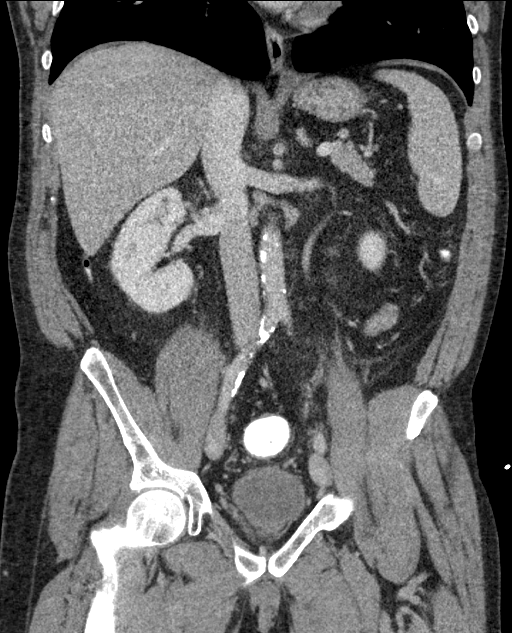

[Series 8: a/p w/ 5mm · axial · 0.69mm/px · z∈[-613,-213]mm · 13 of 90 slices shown, 15 images]
[im 5/90  soft-tissue]
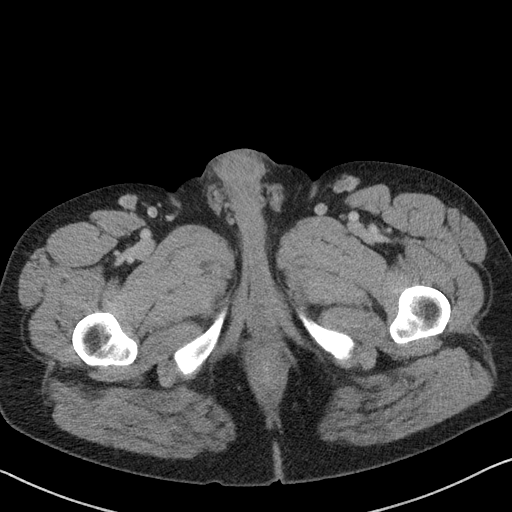
[im 5/90  bone]
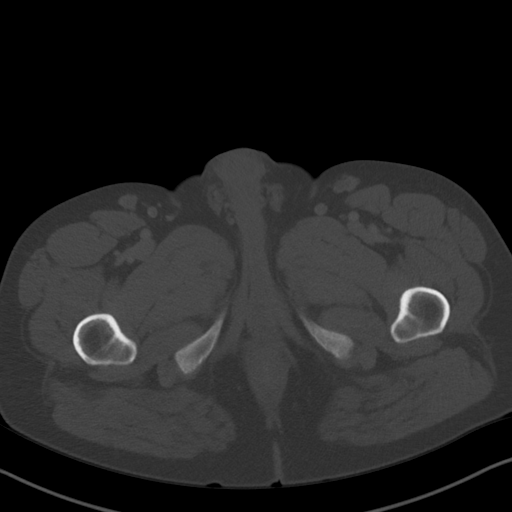
[im 15/90  soft-tissue]
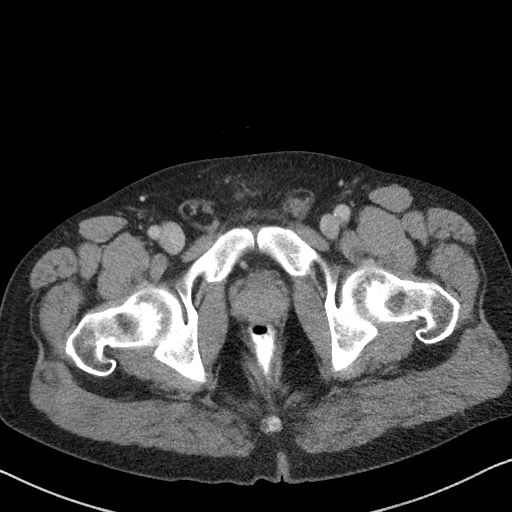
[im 19/90  soft-tissue]
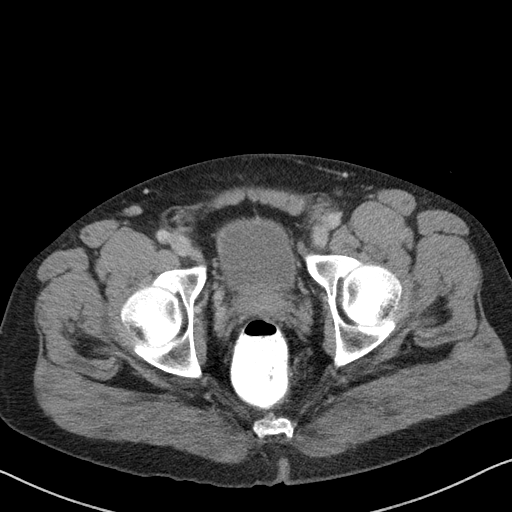
[im 24/90  soft-tissue]
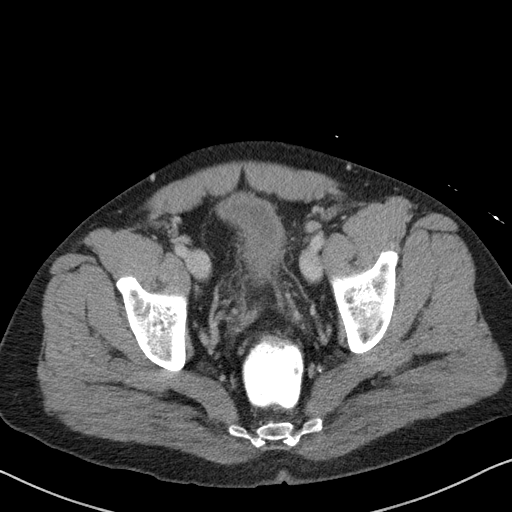
[im 33/90  soft-tissue]
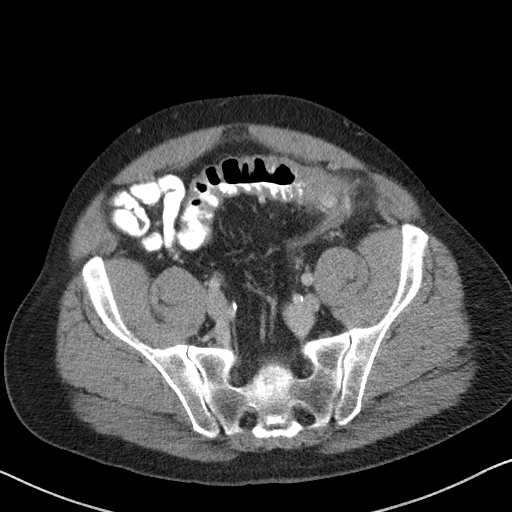
[im 38/90  soft-tissue]
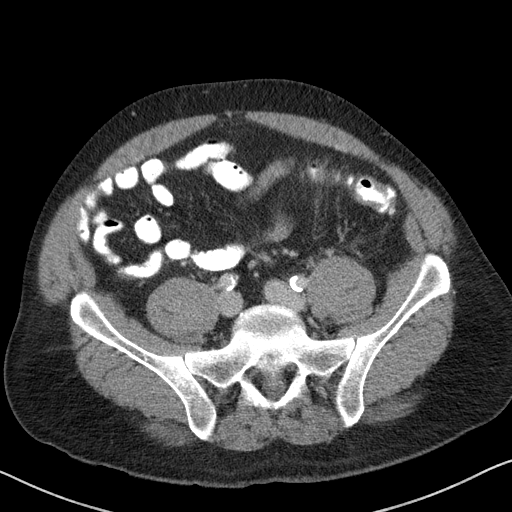
[im 47/90  soft-tissue]
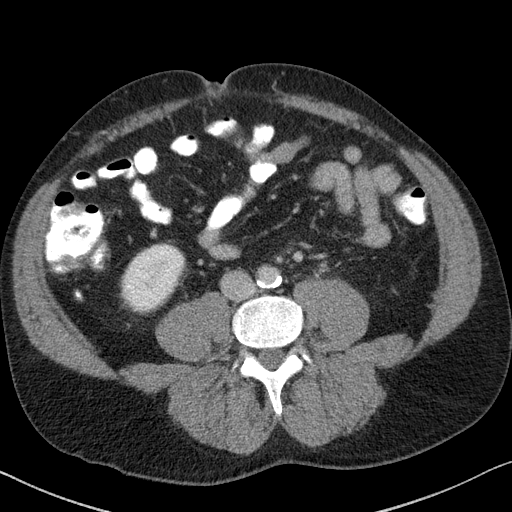
[im 52/90  soft-tissue]
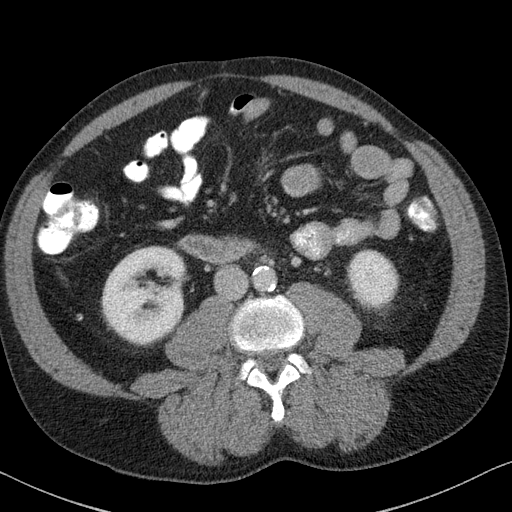
[im 57/90  soft-tissue]
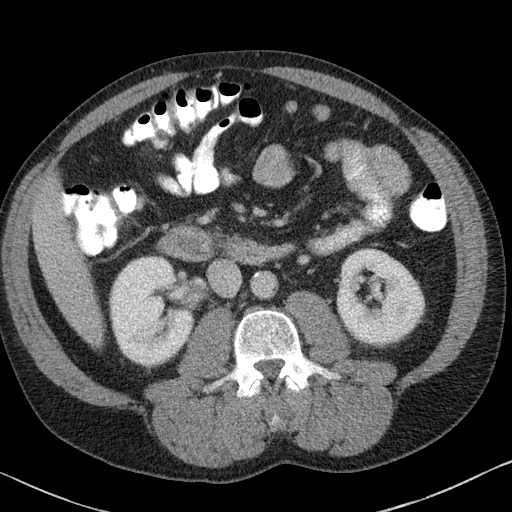
[im 57/90  bone]
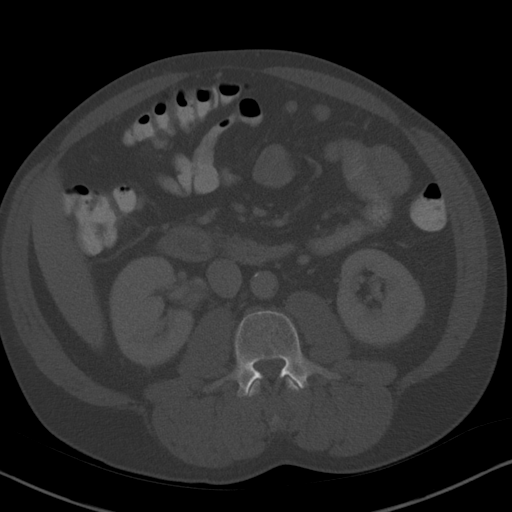
[im 66/90  soft-tissue]
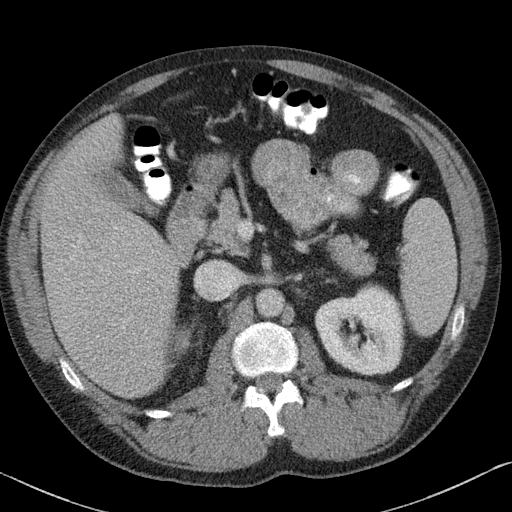
[im 71/90  soft-tissue]
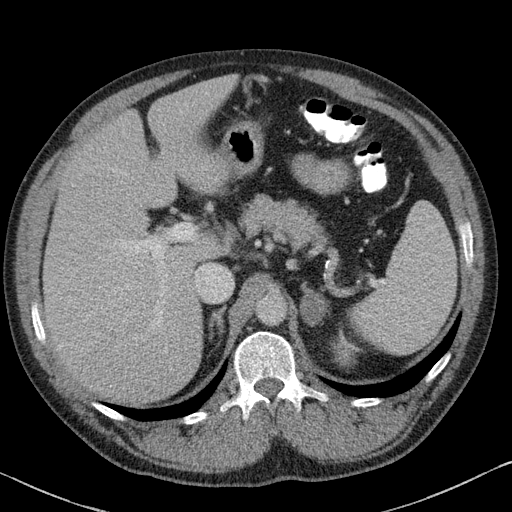
[im 75/90  soft-tissue]
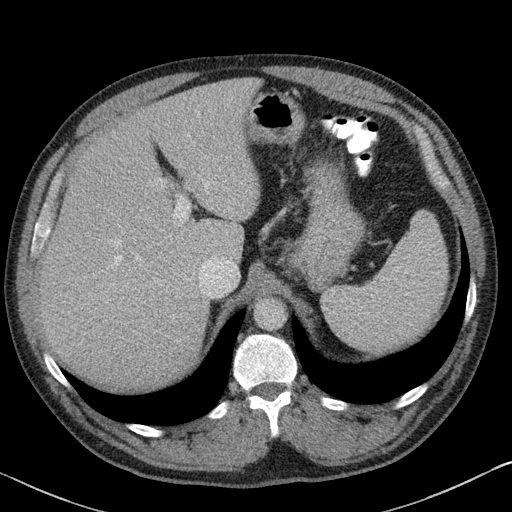
[im 85/90  soft-tissue]
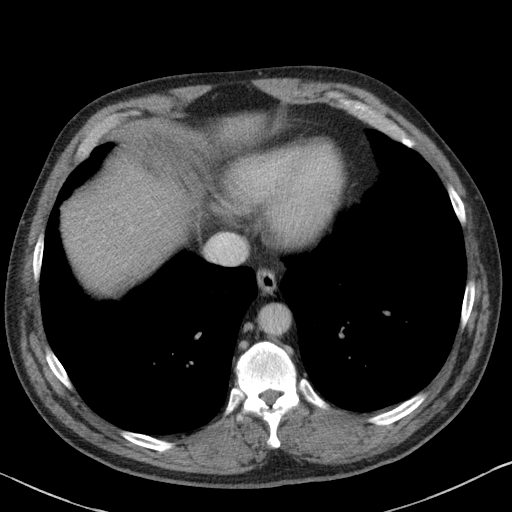

[16 of 46 positions shown; findings below may reference images not displayed]

FINDINGS: Lower chest: No acute abnormality.

Hepatobiliary: Liver cyst within segment 8 measures 11 mm. No
suspicious liver lesions identified. Gallbladder normal. There is no
biliary dilatation.

Pancreas: Unremarkable. No pancreatic ductal dilatation or
surrounding inflammatory changes.

Spleen: Normal in size without focal abnormality.

Adrenals/Urinary Tract: Normal appearance of the right adrenal
gland. Left adrenal nodule measures 2.1 cm and 24 HU, image [DATE]. No
hydronephrosis or hydroureter. Urinary bladder appears normal.

Stomach/Bowel: Stomach is unremarkable. The small bowel loops have a
normal caliber. The appendix is visualized and appears normal.
Distal colonic diverticula noted. Inflamed colonic diverticula is
noted within the left lower quadrant of the abdomen a with
surrounding fat stranding and a small volume of free fluid. There is
abnormal wall thickening involving a segment of sigmoid colon
measuring approximately 11 cm.

Vascular/Lymphatic: Aortic atherosclerosis. No aneurysm. No
adenopathy within the abdomen. No pelvic or inguinal adenopathy.

Reproductive: Prostate is unremarkable.

Other: No abdominal wall hernia or abnormality. No abdominopelvic
ascites.

Musculoskeletal: No acute or significant osseous findings.
IMPRESSION: 1. Exam positive for acute sigmoid diverticulitis. No significant
pneumoperitoneum. No abscess identified.
2.  Aortic Atherosclerosis ([id number]).
3. Indeterminate left adrenal nodule measuring 2.1 cm. Suggest
further evaluation with adrenal protocol MRI or CT.

## 2019-07-31 ENCOUNTER — Inpatient Hospital Stay (HOSPITAL_COMMUNITY)
Admission: EM | Admit: 2019-07-31 | Discharge: 2019-08-02 | DRG: 282 | Discharge status: Against Medical Advice | Payer: Self-pay | Attending: Cardiology | Admitting: Cardiology

## 2019-07-31 ENCOUNTER — Emergency Department (HOSPITAL_COMMUNITY): Payer: Self-pay

## 2019-07-31 ENCOUNTER — Encounter (HOSPITAL_COMMUNITY): Payer: Self-pay | Admitting: Emergency Medicine

## 2019-07-31 ENCOUNTER — Other Ambulatory Visit: Payer: Self-pay

## 2019-07-31 DIAGNOSIS — F1010 Alcohol abuse, uncomplicated: Secondary | ICD-10-CM

## 2019-07-31 DIAGNOSIS — Z809 Family history of malignant neoplasm, unspecified: Secondary | ICD-10-CM

## 2019-07-31 DIAGNOSIS — I214 Non-ST elevation (NSTEMI) myocardial infarction: Secondary | ICD-10-CM

## 2019-07-31 DIAGNOSIS — E785 Hyperlipidemia, unspecified: Secondary | ICD-10-CM | POA: Diagnosis present

## 2019-07-31 DIAGNOSIS — K5730 Diverticulosis of large intestine without perforation or abscess without bleeding: Secondary | ICD-10-CM | POA: Diagnosis present

## 2019-07-31 DIAGNOSIS — Z20828 Contact with and (suspected) exposure to other viral communicable diseases: Secondary | ICD-10-CM | POA: Diagnosis present

## 2019-07-31 DIAGNOSIS — Z8249 Family history of ischemic heart disease and other diseases of the circulatory system: Secondary | ICD-10-CM

## 2019-07-31 DIAGNOSIS — F17210 Nicotine dependence, cigarettes, uncomplicated: Secondary | ICD-10-CM | POA: Diagnosis present

## 2019-07-31 DIAGNOSIS — Z833 Family history of diabetes mellitus: Secondary | ICD-10-CM

## 2019-07-31 DIAGNOSIS — I10 Essential (primary) hypertension: Secondary | ICD-10-CM

## 2019-07-31 LAB — HEPATIC FUNCTION PANEL
ALT: 39 U/L (ref 0–44)
AST: 46 U/L — ABNORMAL HIGH (ref 15–41)
Albumin: 3.9 g/dL (ref 3.5–5.0)
Alkaline Phosphatase: 61 U/L (ref 38–126)
Bilirubin, Direct: 0.2 mg/dL (ref 0.0–0.2)
Indirect Bilirubin: 1.2 mg/dL — ABNORMAL HIGH (ref 0.3–0.9)
Total Bilirubin: 1.4 mg/dL — ABNORMAL HIGH (ref 0.3–1.2)
Total Protein: 6.7 g/dL (ref 6.5–8.1)

## 2019-07-31 LAB — BASIC METABOLIC PANEL
Anion gap: 11 (ref 5–15)
BUN: 5 mg/dL — ABNORMAL LOW (ref 6–20)
CO2: 22 mmol/L (ref 22–32)
Calcium: 9.1 mg/dL (ref 8.9–10.3)
Chloride: 106 mmol/L (ref 98–111)
Creatinine, Ser: 0.83 mg/dL (ref 0.61–1.24)
GFR calc Af Amer: >60 mL/min (ref >60)
GFR calc non Af Amer: >60 mL/min (ref >60)
Glucose, Bld: 122 mg/dL — ABNORMAL HIGH (ref 70–99)
Potassium: 3.5 mmol/L (ref 3.5–5.1)
Sodium: 139 mmol/L (ref 135–145)

## 2019-07-31 LAB — CBC
HCT: 50.5 % (ref 39.0–52.0)
Hemoglobin: 17.6 g/dL — ABNORMAL HIGH (ref 13.0–17.0)
MCH: 33.5 pg (ref 26.0–34.0)
MCHC: 34.9 g/dL (ref 30.0–36.0)
MCV: 96.2 fL (ref 80.0–100.0)
Platelets: 178 10*3/uL (ref 150–400)
RBC: 5.25 MIL/uL (ref 4.22–5.81)
RDW: 12.4 % (ref 11.5–15.5)
WBC: 8.9 10*3/uL (ref 4.0–10.5)
nRBC: 0 % (ref 0.0–0.2)

## 2019-07-31 LAB — TROPONIN I (HIGH SENSITIVITY)
Troponin I (High Sensitivity): 791 ng/L (ref <18)
Troponin I (High Sensitivity): 728 ng/L (ref <18)

## 2019-07-31 LAB — PROTIME-INR
INR: 1 (ref 0.8–1.2)
Prothrombin Time: 13 seconds (ref 11.4–15.2)

## 2019-07-31 MED ORDER — ASPIRIN 81 MG PO CHEW
324.0000 mg | CHEWABLE_TABLET | Freq: Once | ORAL | Status: AC
  Administered 2019-08-01: 324 mg via ORAL
  Filled 2019-07-31: qty 4

## 2019-07-31 MED ORDER — SODIUM CHLORIDE 0.9% FLUSH
3.0000 mL | Freq: Once | INTRAVENOUS | Status: AC
  Administered 2019-07-31: 3 mL via INTRAVENOUS

## 2019-07-31 MED ORDER — NICOTINE 14 MG/24HR TD PT24
14.0000 mg | MEDICATED_PATCH | Freq: Every day | TRANSDERMAL | Status: DC
  Administered 2019-07-31 – 2019-08-01 (×2): 14 mg via TRANSDERMAL
  Filled 2019-07-31 (×2): qty 1

## 2019-07-31 MED ORDER — HEPARIN BOLUS VIA INFUSION
4000.0000 [IU] | Freq: Once | INTRAVENOUS | Status: AC
  Administered 2019-07-31: 4000 [IU] via INTRAVENOUS
  Filled 2019-07-31: qty 4000

## 2019-07-31 MED ORDER — HEPARIN (PORCINE) 25000 UT/250ML-% IV SOLN
1550.0000 [IU]/h | INTRAVENOUS | Status: DC
  Administered 2019-07-31: 1000 [IU]/h via INTRAVENOUS
  Filled 2019-07-31 (×2): qty 250

## 2019-07-31 MED ORDER — NICOTINE POLACRILEX 2 MG MT GUM
2.0000 mg | CHEWING_GUM | OROMUCOSAL | Status: DC | PRN
  Administered 2019-08-01 (×3): 2 mg via ORAL
  Filled 2019-07-31 (×6): qty 1

## 2019-07-31 MED ORDER — NITROGLYCERIN 0.4 MG SL SUBL
0.4000 mg | SUBLINGUAL_TABLET | SUBLINGUAL | Status: DC | PRN
  Administered 2019-07-31 (×2): 0.4 mg via SUBLINGUAL
  Filled 2019-07-31 (×2): qty 1

## 2019-07-31 MED ORDER — LORAZEPAM 2 MG/ML IJ SOLN
1.0000 mg | Freq: Once | INTRAMUSCULAR | Status: AC
  Administered 2019-07-31: 1 mg via INTRAVENOUS
  Filled 2019-07-31: qty 1

## 2019-07-31 NOTE — ED Notes (Signed)
Pt states he took nitro x2 at home prior to receiving 0.4mg  at 2317

## 2019-07-31 NOTE — ED Triage Notes (Signed)
Pt brought to ED by [facility] from home for c/o cp and HA for the past 4 days no getting any better with OTC medication. SR on the monitor for  EMs BP 168/110, HR 88, R -16, SPO2 96% RA. 2 nitros given by EMS pta cp down 5 to 1.

## 2019-07-31 NOTE — H&P (Signed)
Cardiology History & Physical    Patient ID: Barry Gutierrez MRN: [id number], DOB/AGE: 1960-10-05   Admit date: 07/31/2019  Primary Physician: Rutherford Guys, MD Primary Cardiologist: No primary care provider on file.  Patient Profile    58 year old man with no cardiac history who presented with several days of worsening chest pain, admitted to cardiology after troponin elevated.   History of Present Illness    Patient denies any cardiac history, denies DM, HTN, HLD, and only takes OTC medications (vitamins, tried famotidine for chest discomfort as well as BC powder but doesn't think it helped). I suspect he may have COPD based on heavy smoking history and reported dyspnea and chronic cough. Over the past few days he's had worsening intermittent crushing chest pain worse with exertion, usually resolves with rest, no prior pain before several days ago. The pain today persisted at rest so he presented to ED, pain now resolved. No radiation to back when present. He reports associated dyspnea, denies palpitations, syncope, pre-syncope. He also reports some orthopnea but no edema. He drinks several shots of bourbon daily, denies drug use.   Currently he is chest pain free, denies any active symptoms, no dyspnea, denies recent fever, sweats, taste/smell issues. Denies current headache. No neurologic symptoms, no history of bleeding, clots.   Review of Systems   [y] = yes, [ ]  = no      General: Weight gain [ ] ; Weight loss [ ] ; Fever [ ]   Cardiac: Chest pain/pressure Blue.Reese ]; Resting SOB [ y]; Exertional SOB Blue.Reese ]; Orthopnea [ y]; Paroxysmal nocturnal dyspnea[ y]; Lower extremity Edema [ ] ; Palpitations [ ] ; Syncope [ ] ; Presyncope [ ]   Pulmonary: Cough [ ] ; Hemoptysis[ ]   GI: Vomiting[ ] ; Dysphagia[ ] ; Melena[ ] ; Hematochezia [ ] ; Heartburn[ ] ; Diarrhea [ ] ; BRBPR [ ]     GU: Hematuria[ ] ; Dysuria [ ]   Vascular: Pain in legs with walking [ ] ; Non-healing sores [ ]   Neuro: Stroke [ ] ;  TIA [ ] ; Vision changes [ ]     Ortho/Skin: back pain [ ] ; Rash [ ]     Heme: Bleeding problems [ ] ; Clotting disorders [ ] ; Anemia [ ]     Endocrine: Diabetes [ ] ; Thyroid dysfunction[ ]   Past Medical History   Past Medical History:  Diagnosis Date  . Allergy     History reviewed. No pertinent surgical history.   Allergies No Known Allergies  Home Medications    Prior to Admission medications   Medication Sig Start Date End Date Taking? Authorizing Provider  acetaminophen (TYLENOL) 325 MG tablet Take 325-650 mg by mouth every 6 (six) hours as needed (for pain).   Yes [provider]  famotidine (PEPCID) 20 MG tablet One at bedtime Patient taking differently: Take 20-80 mg by mouth 2 (two) times daily as needed for heartburn or indigestion.  11/12/17  Yes Tanda Rockers, MD  clotrimazole (LOTRIMIN) 1 % cream Apply 1 application topically 2 (two) times daily. Patient not taking: Reported on 07/31/2019 04/08/18   Tenna Delaine D, PA-C  NEOMYCIN-POLYMYXIN-HYDROCORTISONE (CORTISPORIN) 1 % SOLN OTIC solution Place 3 drops into the right ear 4 (four) times daily. Patient not taking: Reported on 07/31/2019 04/08/18   Tenna Delaine D, PA-C  nystatin (MYCOSTATIN/NYSTOP) powder Apply topically 4 (four) times daily. Patient not taking: Reported on 07/31/2019 04/08/18   Tenna Delaine D, PA-C  pantoprazole (PROTONIX) 40 MG tablet Take 1 tablet (40 mg total) by mouth daily. Take 30-60 min before first  meal of the day Patient not taking: Reported on 07/31/2019 11/12/17   Nyoka Cowden, MD  traMADol Janean Sark) 50 MG tablet 1-2 every 4 hours as needed for cough or pain Patient not taking: Reported on 02/05/2018 11/12/17   Nyoka Cowden, MD    Family History    Family History  Problem Relation Age of Onset  . Cancer Mother   . CAD Mother   . Coronary artery disease Mother   . Diabetes Father   . CAD Father   . Coronary artery disease Father    He indicated that his mother is  deceased. He indicated that his father is deceased.   Social History    Social History   Socioeconomic History  . Marital status: Legally Separated    Spouse name: Not on file  . Number of children: Not on file  . Years of education: Not on file  . Highest education level: Not on file  Occupational History  . Not on file  Social Needs  . Financial resource strain: Not on file  . Food insecurity    Worry: Not on file    Inability: Not on file  . Transportation needs    Medical: Not on file    Non-medical: Not on file  Tobacco Use  . Smoking status: Current Every Day Smoker    Packs/day: 2.00    Types: Cigarettes  . Smokeless tobacco: Never Used  Substance and Sexual Activity  . Alcohol use: Yes  . Drug use: No  . Sexual activity: Not Currently  Lifestyle  . Physical activity    Days per week: Not on file    Minutes per session: Not on file  . Stress: Not on file  Relationships  . Social Musician on phone: Not on file    Gets together: Not on file    Attends religious service: Not on file    Active member of club or organization: Not on file    Attends meetings of clubs or organizations: Not on file    Relationship status: Not on file  . Intimate partner violence    Fear of current or ex partner: Not on file    Emotionally abused: Not on file    Physically abused: Not on file    Forced sexual activity: Not on file  Other Topics Concern  . Not on file  Social History Narrative  . Not on file     Physical Exam    BP (!) 175/115   Pulse 95   Temp 99.2 F (37.3 C) (Oral)   Resp 13   SpO2 98%  General: Alert, NAD HEENT: Normal  Neck: No bruits or JVD. Lungs:  Resp regular and unlabored, CTA bilaterally. Heart: Regular rhythm, no s3, s4, or murmurs. Abdomen: Soft, non-tender, non-distended, BS +.  Extremities: Warm. No clubbing, cyanosis or edema. DP/PT/Radials 2+ and equal bilaterally. Psych: Normal affect. Neuro: Alert and oriented. No  gross focal deficits. No abnormal movements.  Labs    Troponin (Point of Care Test) No results for input(s): TROPIPOC in the last 72 hours. No results for input(s): CKTOTAL, CKMB, TROPONINI in the last 72 hours. Lab Results  Component Value Date   WBC 8.9 07/31/2019   HGB 17.6 (H) 07/31/2019   HCT 50.5 07/31/2019   MCV 96.2 07/31/2019   PLT 178 07/31/2019    Recent Labs  Lab 07/31/19 2046  NA 139  K 3.5  CL 106  CO2 22  BUN 5*  CREATININE 0.83  CALCIUM 9.1  GLUCOSE 122*   No results found for: CHOL, HDL, LDLCALC, TRIG No results found for: [facility]   Radiology Studies    Dg Chest 2 View  Result Date: 07/31/2019 CLINICAL DATA:  Chest pain EXAM: CHEST - 2 VIEW COMPARISON:  Chest radiograph 11/12/2017 FINDINGS: Normal cardiac and mediastinal contours. No consolidative pulmonary opacities. No pleural effusion or pneumothorax. Thoracic spine degenerative changes. IMPRESSION: No acute cardiopulmonary process. Electronically Signed   By: Annia Beltrew  Davis M.D.   On: 07/31/2019 21:08    ECG & Cardiac Imaging    ECG - NSR with non-specific ST-T changes (borderline STD precordial lateral leads, subtle TWI inferior leads)  Assessment & Plan    69M heavy smoker with no cardiac history presents with chest pain and troponin elevation with non-specific ST changes on ECG, suspect type I NSTEMI, will treat with aspirin and heparin, start cardiac medication optimization. [facility], will make NPO after midnight.   NSTEMI - aspirin 81mg  daily, atorvastatin 40 qhs - PRN nitroglycerin - heparin infusion - trend troponin to peak, serial ECG's, monitor closely on telemetry - nicotine patch + lozenge, cessation - start low dose coreg BID tonight - plan to add ACE-I/ARB if BP can tolerate - NPO after midnight, likely [facility] tomorrow vs Monday   PPX: DVT: on heparin infusion Diet: NPO after midnight GI: not indicated Code status: FULL   Signed, Sherryl MangesGeorge M Jancarlo Biermann,  MD 07/31/2019, 11:08 PM

## 2019-07-31 NOTE — ED Notes (Signed)
ED TO INPATIENT HANDOFF REPORT  ED Nurse Name and Phone #: Magnus IvanLouie, RN [phone]  S Name/Age/Gender Barry Gutierrez 58 y.o. male Room/Bed: 025C/025C  Code Status   Code Status: Not on file  Home/SNF/Other Home Patient oriented to: self, place, time and situation Is this baseline? No   Triage Complete: Triage complete  Chief Complaint CP  Triage Note Pt brought to ED by [facility] from home for c/o cp and HA for the past 4 days no getting any better with OTC medication. SR on the monitor for  EMs BP 168/110, HR 88, R -16, SPO2 96% RA. 2 nitros given by EMS pta cp down 5 to 1.   Allergies No Known Allergies  Level of Care/Admitting Diagnosis ED Disposition    ED Disposition Condition Comment   Admit  [name][id number]  Level of Care: Telemetry Cardiac [103]  Covid Evaluation: N/A  Diagnosis: NSTEMI (non-ST elevated myocardial infarction) [facility](HCC) [[id number]]) [[id number]]  Admitting Physician: Lewayne BuntingRENSHAW, BRIAN S [[id number]]  Attending Physician: Lewayne BuntingRENSHAW, BRIAN S [[id number]]  Estimated length of stay: past midnight tomorrow  Certification:: I certify this patient will need inpatient services for at least 2 midnights  PT Class (Do Not Modify): Inpatient [101]  PT Acc Code (Do Not Modify): Private [1]       B Medical/Surgery History Past Medical History:  Diagnosis Date  . Allergy    History reviewed. No pertinent surgical history.   A IV Location/Drains/Wounds Patient Lines/Drains/Airways Status   Active Line/Drains/Airways    Name:   Placement date:   Placement time:   Site:   Days:   Peripheral IV 02/02/18 Right Antecubital   02/02/18    1648    Antecubital   544   Peripheral IV Right Antecubital   -    -    Antecubital             Intake/Output Last 24 hours No intake or output data in the 24 hours ending 07/31/19 2329  Labs/Imaging Results for orders placed or performed during the hospital encounter of 07/31/19 (from the past 48 hour(s))  Basic  metabolic panel     Status: Abnormal   Collection Time: 07/31/19  8:46 PM  Result Value Ref Range   Sodium 139 135 - 145 mmol/L   Potassium 3.5 3.5 - 5.1 mmol/L   Chloride 106 98 - 111 mmol/L   CO2 22 22 - 32 mmol/L   Glucose, Bld 122 (H) 70 - 99 mg/dL   BUN 5 (L) 6 - 20 mg/dL   Creatinine, Ser 0.450.83 0.61 - 1.24 mg/dL   Calcium 9.1 8.9 - 40.910.3 mg/dL   GFR calc non Af Amer >60 >60 mL/min   GFR calc Af Amer >60 >60 mL/min   Anion gap 11 5 - 15    Comment: Performed at [facility], [address] [place], [place] [address]  CBC     Status: Abnormal   Collection Time: 07/31/19  8:46 PM  Result Value Ref Range   WBC 8.9 4.0 - 10.5 K/uL   RBC 5.25 4.22 - 5.81 MIL/uL   Hemoglobin 17.6 (H) 13.0 - 17.0 g/dL   HCT 47.850.5 29.539.0 - 62.152.0 %   MCV 96.2 80.0 - 100.0 fL   MCH 33.5 26.0 - 34.0 pg   MCHC 34.9 30.0 - 36.0 g/dL   RDW 30.812.4 65.711.5 - 84.615.5 %   Platelets 178 150 - 400 K/uL   nRBC 0.0 0.0 -  0.2 %    Comment: Performed at [facility], [place] [address], [place], [place] [address]  Troponin I (High Sensitivity)     Status: Abnormal   Collection Time: 07/31/19  8:46 PM  Result Value Ref Range   Troponin I (High Sensitivity) 791 (HH) <18 ng/L    Comment: CRITICAL RESULT CALLED TO, READ BACK BY AND VERIFIED WITH: RN M RUGGREIO @2137  07/31/19 BY S GEZAHEGN (NOTE) Elevated high sensitivity troponin I (hsTnI) values and significant  changes across serial measurements may suggest ACS but many other  chronic and acute conditions are known to elevate hsTnI results.  Refer to the Links section for chest pain algorithms and additional  guidance. Performed at [facility], [place] [address], [place], [place] [address]   Protime-INR     Status: None   Collection Time: 07/31/19 10:27 PM  Result Value Ref Range   Prothrombin Time 13.0 11.4 - 15.2 seconds   INR 1.0 0.8 - 1.2    Comment: (NOTE) INR goal varies based on device and disease states. Performed at [facility], [place] [address], [place],  [address]    Dg Chest 2 View  Result Date: 07/31/2019 CLINICAL DATA:  Chest pain EXAM: CHEST - 2 VIEW COMPARISON:  Chest radiograph 11/12/2017 FINDINGS: Normal cardiac and mediastinal contours. No consolidative pulmonary opacities. No pleural effusion or pneumothorax. Thoracic spine degenerative changes. IMPRESSION: No acute cardiopulmonary process. Electronically Signed   By: Lovey Newcomer M.D.   On: 07/31/2019 21:08    Pending Labs Unresulted Labs (From admission, onward)    Start     Ordered   07/31/19 2240  Brain natriuretic peptide  Once,   STAT     07/31/19 2239   07/31/19 2240  Ethanol  Once,   STAT     07/31/19 2239   07/31/19 2240  Urine rapid drug screen (hosp performed)  ONCE - STAT,   STAT     07/31/19 2239   07/31/19 2240  Hepatic function panel  Once,   R     07/31/19 2240   07/31/19 2231  SARS CORONAVIRUS 2 (TAT 6-24 HRS) Nasopharyngeal Nasopharyngeal Swab  (Asymptomatic/Tier 3)  Once,   STAT    Question Answer Comment  Is this test for diagnosis or screening Screening   Symptomatic for COVID-19 as defined by CDC No   Hospitalized for COVID-19 No   Admitted to ICU for COVID-19 No   Previously tested for COVID-19 No   Resident in a congregate (group) care setting No   Employed in healthcare setting No      07/31/19 2230   Signed and Held  HIV Antibody (routine testing w rflx)  (HIV Antibody (Routine testing w reflex) panel)  Once,   R     Signed and Held   Signed and Held  Hemoglobin A1c  Once,   R     Signed and Held   Signed and Held  Basic metabolic panel  Tomorrow morning,   R     Signed and Held   Signed and Held  Lipid panel  Tomorrow morning,   R     Signed and Held   Signed and Held  CBC  Tomorrow morning,   R     Signed and Held          Vitals/Pain Today's Vitals   07/31/19 2224 07/31/19 2230 07/31/19 2300 07/31/19 2315  BP:    (!) 156/99  Pulse:  88 95 86  Resp:  13 13 17   Temp:      TempSrc:      SpO2:  98% 98% 97%   PainSc: 2        Isolation Precautions No active isolations  Medications Medications  nitroGLYCERIN (NITROSTAT) SL tablet 0.4 mg (0.4 mg Sublingual Given 07/31/19 2317)  aspirin chewable tablet 324 mg (has no administration in time range)  heparin ADULT infusion 100 units/mL (25000 units/246mL sodium chloride 0.45%) (has no administration in time range)  heparin bolus via infusion 4,000 Units (has no administration in time range)  sodium chloride flush (NS) 0.9 % injection 3 mL (3 mLs Intravenous Given 07/31/19 2223)  LORazepam (ATIVAN) injection 1 mg (1 mg Intravenous Given 07/31/19 2308)    Mobility walks Low fall risk   Focused Assessments Cardiac Assessment Handoff:    No results found for: CKTOTAL, CKMB, CKMBINDEX, TROPONINI No results found for: DDIMER Does the Patient currently have chest pain? No      R Recommendations: See Admitting Provider Note  Report given to:   Additional Notes:

## 2019-07-31 NOTE — ED Provider Notes (Signed)
[facility] EMERGENCY DEPARTMENT Provider Note   CSN: [id number] Arrival date & time: 07/31/19  2028     History   Chief Complaint Chief Complaint  Patient presents with  . Chest Pain    HPI Barry Gutierrez is a 58 y.o. male.     The history is provided by the patient. No language interpreter was used.  Chest Pain  Barry Gutierrez is a 58 y.o. male who presents to the Emergency Department complaining of chest pain. He presents the emergency department complaining of five days of waxing and waning chest pain. Pain is located in central and left chest and described as a squeezing type sensation. It is nonradiating. He states that is worse with smoking, otherwise it is inconsistent when it comes and goes. He has been experiencing one month of shortness of breath with orthopnea. He has been experiencing nausea, diaphoresis today. He denies any fevers, vomiting, abdominal pain, leg swelling or pain. He has no known medical problems. He takes no prescription medications. He has been taking multiple goody powders for his pain. He took to when his pain acutely worsened today. He is taken about five goody powders today. He did have significant improvement in his pain after nitroglycerin administration by EMS. He does smoke cigarettes. He drinks about five Bourbons daily. No history of alcohol withdrawal. No known COVID-19 exposures. Past Medical History:  Diagnosis Date  . Allergy     Patient Active Problem List   Diagnosis Date Noted  . NSTEMI (non-ST elevated myocardial infarction) ([place]) 07/31/2019  . Diverticulosis of colon 02/05/2018  . Upper airway cough syndrome 11/13/2017  . Cigarette smoker 11/13/2017  . Asthmatic bronchitis with acute exacerbation 11/12/2017    History reviewed. No pertinent surgical history.      Home Medications    Prior to Admission medications   Medication Sig Start Date End Date Taking? Authorizing Provider  acetaminophen  (TYLENOL) 325 MG tablet Take 325-650 mg by mouth every 6 (six) hours as needed (for pain).   Yes [provider]  famotidine (PEPCID) 20 MG tablet One at bedtime Patient taking differently: Take 20-80 mg by mouth 2 (two) times daily as needed for heartburn or indigestion.  11/12/17  Yes Tanda Rockers, MD  clotrimazole (LOTRIMIN) 1 % cream Apply 1 application topically 2 (two) times daily. Patient not taking: Reported on 07/31/2019 04/08/18   Tenna Delaine D, PA-C  NEOMYCIN-POLYMYXIN-HYDROCORTISONE (CORTISPORIN) 1 % SOLN OTIC solution Place 3 drops into the right ear 4 (four) times daily. Patient not taking: Reported on 07/31/2019 04/08/18   Tenna Delaine D, PA-C  nystatin (MYCOSTATIN/NYSTOP) powder Apply topically 4 (four) times daily. Patient not taking: Reported on 07/31/2019 04/08/18   Tenna Delaine D, PA-C  pantoprazole (PROTONIX) 40 MG tablet Take 1 tablet (40 mg total) by mouth daily. Take 30-60 min before first meal of the day Patient not taking: Reported on 07/31/2019 11/12/17   Tanda Rockers, MD  traMADol Veatrice Bourbon) 50 MG tablet 1-2 every 4 hours as needed for cough or pain Patient not taking: Reported on 02/05/2018 11/12/17   Tanda Rockers, MD    Family History Family History  Problem Relation Age of Onset  . Cancer Mother   . CAD Mother   . Coronary artery disease Mother   . Diabetes Father   . CAD Father   . Coronary artery disease Father     Social History Social History   Tobacco Use  . Smoking status: Current  Every Day Smoker    Packs/day: 2.00    Types: Cigarettes  . Smokeless tobacco: Never Used  Substance Use Topics  . Alcohol use: Yes  . Drug use: No     Allergies   Patient has no known allergies.   Review of Systems Review of Systems  Cardiovascular: Positive for chest pain.  All other systems reviewed and are negative.    Physical Exam Updated Vital Signs BP 129/79   Pulse 85   Temp 99.2 F (37.3 C) (Oral)   Resp 15   SpO2  97%   Physical Exam Vitals signs and nursing note reviewed.  Constitutional:      Appearance: He is well-developed.  HENT:     Head: Normocephalic and atraumatic.  Cardiovascular:     Rate and Rhythm: Normal rate and regular rhythm.     Heart sounds: No murmur.  Pulmonary:     Effort: Pulmonary effort is normal. No respiratory distress.     Breath sounds: Normal breath sounds.  Abdominal:     Palpations: Abdomen is soft.     Tenderness: There is no abdominal tenderness. There is no guarding or rebound.  Musculoskeletal:        General: No tenderness.  Skin:    General: Skin is warm and dry.  Neurological:     Mental Status: He is alert and oriented to person, place, and time.  Psychiatric:     Comments: Anxious appearing      ED Treatments / Results  Labs (all labs ordered are listed, but only abnormal results are displayed) Labs Reviewed  BASIC METABOLIC PANEL - Abnormal; Notable for the following components:      Result Value   Glucose, Bld 122 (*)    BUN 5 (*)    All other components within normal limits  CBC - Abnormal; Notable for the following components:   Hemoglobin 17.6 (*)    All other components within normal limits  HEPATIC FUNCTION PANEL - Abnormal; Notable for the following components:   AST 46 (*)    Total Bilirubin 1.4 (*)    Indirect Bilirubin 1.2 (*)    All other components within normal limits  TROPONIN I (HIGH SENSITIVITY) - Abnormal; Notable for the following components:   Troponin I (High Sensitivity) 791 (*)    All other components within normal limits  TROPONIN I (HIGH SENSITIVITY) - Abnormal; Notable for the following components:   Troponin I (High Sensitivity) 728 (*)    All other components within normal limits  SARS CORONAVIRUS 2 (TAT 6-24 HRS)  PROTIME-INR  ETHANOL  RAPID URINE DRUG SCREEN, HOSP PERFORMED  BRAIN NATRIURETIC PEPTIDE    EKG EKG Interpretation  Date/Time:  Saturday July 31 2019 22:16:07 EST Ventricular Rate:   83 PR Interval:  140 QRS Duration: 97 QT Interval:  407 QTC Calculation: 479 R Axis:   67 Text Interpretation: Sinus rhythm LAE, consider biatrial enlargement Borderline ST depression, diffuse leads Abnormal T, consider ischemia, inferior leads Confirmed by Tilden Fossaees, Zissel Biederman [phone]([id number]) on 07/31/2019 10:31:38 PM   Radiology Dg Chest 2 View  Result Date: 07/31/2019 CLINICAL DATA:  Chest pain EXAM: CHEST - 2 VIEW COMPARISON:  Chest radiograph 11/12/2017 FINDINGS: Normal cardiac and mediastinal contours. No consolidative pulmonary opacities. No pleural effusion or pneumothorax. Thoracic spine degenerative changes. IMPRESSION: No acute cardiopulmonary process. Electronically Signed   By: Annia Beltrew  Davis M.D.   On: 07/31/2019 21:08    Procedures Procedures (including critical care time) CRITICAL CARE Performed by: Lanora ManisElizabeth  Luvada Salamone   Total critical care time: 35 minutes  Critical care time was exclusive of separately billable procedures and treating other patients.  Critical care was necessary to treat or prevent imminent or life-threatening deterioration.  Critical care was time spent personally by me on the following activities: development of treatment plan with patient and/or surrogate as well as nursing, discussions with consultants, evaluation of patient's response to treatment, examination of patient, obtaining history from patient or surrogate, ordering and performing treatments and interventions, ordering and review of laboratory studies, ordering and review of radiographic studies, pulse oximetry and re-evaluation of patient's condition.  Medications Ordered in ED Medications  nitroGLYCERIN (NITROSTAT) SL tablet 0.4 mg (0.4 mg Sublingual Given 07/31/19 2350)  aspirin chewable tablet 324 mg (has no administration in time range)  heparin ADULT infusion 100 units/mL (25000 units/28mL sodium chloride 0.45%) (1,000 Units/hr Intravenous New Bag/Given 07/31/19 2348)  nicotine (NICODERM CQ -  dosed in mg/24 hours) patch 14 mg (14 mg Transdermal Patch Applied 07/31/19 2342)  nicotine polacrilex (NICORETTE) gum 2 mg (has no administration in time range)  sodium chloride flush (NS) 0.9 % injection 3 mL (3 mLs Intravenous Given 07/31/19 2223)  LORazepam (ATIVAN) injection 1 mg (1 mg Intravenous Given 07/31/19 2308)  heparin bolus via infusion 4,000 Units (4,000 Units Intravenous Bolus from Bag 07/31/19 2349)     Initial Impression / Assessment and Plan / ED Course  I have reviewed the triage vital signs and the nursing notes.  Pertinent labs & imaging results that were available during my care of the patient were reviewed by me and considered in my medical decision making (see chart for details).        Patient here evaluation of chest pain over the last five days. EKG with diffuse ST changes concerning for ischemia, troponin is elevated. Presentation is consistent with acute coronary syndrome, nonSTEMI. Presentation is not consistent with PE, dissection. Discussed with patient findings of studies and recommendation for admission and he is in agreement with treatment plan. Cardiology consulted for admission.  Traquan Duarte Yoo was evaluated in Emergency Department on 08/01/2019 for the symptoms described in the history of present illness. He was evaluated in the context of the global COVID-19 pandemic, which necessitated consideration that the patient might be at risk for infection with the SARS-CoV-2 virus that causes COVID-19. Institutional protocols and algorithms that pertain to the evaluation of patients at risk for COVID-19 are in a state of rapid change based on information released by regulatory bodies including the CDC and federal and state organizations. These policies and algorithms were followed during the patient's care in the ED.   Final Clinical Impressions(s) / ED Diagnoses   Final diagnoses:  NSTEMI (non-ST elevated myocardial infarction) [facility])    ED Discharge  Orders    None       Tilden Fossa, MD 08/01/19 0021

## 2019-07-31 NOTE — ED Notes (Signed)
Cardio at bedside

## 2019-08-01 ENCOUNTER — Encounter (HOSPITAL_COMMUNITY): Payer: Self-pay

## 2019-08-01 ENCOUNTER — Inpatient Hospital Stay (HOSPITAL_COMMUNITY): Payer: Self-pay

## 2019-08-01 DIAGNOSIS — I10 Essential (primary) hypertension: Secondary | ICD-10-CM

## 2019-08-01 DIAGNOSIS — F17210 Nicotine dependence, cigarettes, uncomplicated: Secondary | ICD-10-CM

## 2019-08-01 DIAGNOSIS — R079 Chest pain, unspecified: Secondary | ICD-10-CM

## 2019-08-01 DIAGNOSIS — I214 Non-ST elevation (NSTEMI) myocardial infarction: Principal | ICD-10-CM

## 2019-08-01 DIAGNOSIS — F1010 Alcohol abuse, uncomplicated: Secondary | ICD-10-CM

## 2019-08-01 LAB — TROPONIN I (HIGH SENSITIVITY)
Troponin I (High Sensitivity): 1065 ng/L (ref <18)
Troponin I (High Sensitivity): 1257 ng/L (ref <18)

## 2019-08-01 LAB — CBC
HCT: 46.7 % (ref 39.0–52.0)
Hemoglobin: 16.8 g/dL (ref 13.0–17.0)
MCH: 33.9 pg (ref 26.0–34.0)
MCHC: 36 g/dL (ref 30.0–36.0)
MCV: 94.3 fL (ref 80.0–100.0)
Platelets: 157 10*3/uL (ref 150–400)
RBC: 4.95 MIL/uL (ref 4.22–5.81)
RDW: 12.4 % (ref 11.5–15.5)
WBC: 8.2 10*3/uL (ref 4.0–10.5)
nRBC: 0 % (ref 0.0–0.2)

## 2019-08-01 LAB — BRAIN NATRIURETIC PEPTIDE: B Natriuretic Peptide: 313.9 pg/mL — ABNORMAL HIGH (ref 0.0–100.0)

## 2019-08-01 LAB — LIPID PANEL
Cholesterol: 212 mg/dL — ABNORMAL HIGH (ref 0–200)
HDL: 46 mg/dL (ref >40)
LDL Cholesterol: 128 mg/dL — ABNORMAL HIGH (ref 0–99)
Total CHOL/HDL Ratio: 4.6 RATIO
Triglycerides: 191 mg/dL — ABNORMAL HIGH (ref <150)
VLDL: 38 mg/dL (ref 0–40)

## 2019-08-01 LAB — BASIC METABOLIC PANEL
Anion gap: 13 (ref 5–15)
BUN: 6 mg/dL (ref 6–20)
CO2: 22 mmol/L (ref 22–32)
Calcium: 9 mg/dL (ref 8.9–10.3)
Chloride: 106 mmol/L (ref 98–111)
Creatinine, Ser: 0.9 mg/dL (ref 0.61–1.24)
GFR calc Af Amer: >60 mL/min (ref >60)
GFR calc non Af Amer: >60 mL/min (ref >60)
Glucose, Bld: 116 mg/dL — ABNORMAL HIGH (ref 70–99)
Potassium: 3.8 mmol/L (ref 3.5–5.1)
Sodium: 141 mmol/L (ref 135–145)

## 2019-08-01 LAB — RAPID URINE DRUG SCREEN, HOSP PERFORMED
Amphetamines: NOT DETECTED
Barbiturates: NOT DETECTED
Benzodiazepines: NOT DETECTED
Cocaine: NOT DETECTED
Opiates: NOT DETECTED
Tetrahydrocannabinol: NOT DETECTED

## 2019-08-01 LAB — HEPARIN LEVEL (UNFRACTIONATED)
Heparin Unfractionated: <0.1 IU/mL — ABNORMAL LOW (ref 0.30–0.70)
Heparin Unfractionated: 0.13 IU/mL — ABNORMAL LOW (ref 0.30–0.70)

## 2019-08-01 LAB — ECHOCARDIOGRAM COMPLETE
Height: 70 in
Weight: 3291.03 oz

## 2019-08-01 LAB — HEMOGLOBIN A1C
Hgb A1c MFr Bld: 5.4 % (ref 4.8–5.6)
Mean Plasma Glucose: 108.28 mg/dL

## 2019-08-01 LAB — MAGNESIUM: Magnesium: 2.1 mg/dL (ref 1.7–2.4)

## 2019-08-01 LAB — HIV ANTIBODY (ROUTINE TESTING W REFLEX): HIV Screen 4th Generation wRfx: NONREACTIVE

## 2019-08-01 LAB — SARS CORONAVIRUS 2 (TAT 6-24 HRS): SARS Coronavirus 2: NEGATIVE

## 2019-08-01 LAB — MRSA PCR SCREENING: MRSA by PCR: NEGATIVE

## 2019-08-01 LAB — PHOSPHORUS: Phosphorus: 2.8 mg/dL (ref 2.5–4.6)

## 2019-08-01 LAB — ETHANOL: Alcohol, Ethyl (B): <10 mg/dL (ref <10)

## 2019-08-01 MED ORDER — VITAMIN B-1 100 MG PO TABS
100.0000 mg | ORAL_TABLET | Freq: Every day | ORAL | Status: DC
  Administered 2019-08-01: 100 mg via ORAL
  Filled 2019-08-01: qty 1

## 2019-08-01 MED ORDER — ATORVASTATIN CALCIUM 80 MG PO TABS
80.0000 mg | ORAL_TABLET | Freq: Every day | ORAL | Status: DC
  Administered 2019-08-01: 80 mg via ORAL
  Filled 2019-08-01: qty 1

## 2019-08-01 MED ORDER — LORAZEPAM 2 MG/ML IJ SOLN
1.0000 mg | INTRAMUSCULAR | Status: DC | PRN
  Administered 2019-08-01 (×2): 1 mg via INTRAVENOUS
  Administered 2019-08-01: 2 mg via INTRAVENOUS
  Filled 2019-08-01 (×4): qty 1

## 2019-08-01 MED ORDER — PERFLUTREN LIPID MICROSPHERE
1.0000 mL | INTRAVENOUS | Status: AC | PRN
  Administered 2019-08-01: 3 mL via INTRAVENOUS
  Filled 2019-08-01: qty 10

## 2019-08-01 MED ORDER — CARVEDILOL 6.25 MG PO TABS
6.2500 mg | ORAL_TABLET | Freq: Two times a day (BID) | ORAL | Status: DC
  Administered 2019-08-01: 6.25 mg via ORAL
  Filled 2019-08-01: qty 1

## 2019-08-01 MED ORDER — ONDANSETRON HCL 4 MG/2ML IJ SOLN: 4.0000 mg | Freq: Four times a day (QID) | INTRAMUSCULAR | Status: DC | PRN

## 2019-08-01 MED ORDER — NITROGLYCERIN IN D5W 200-5 MCG/ML-% IV SOLN
0.0000 ug/min | INTRAVENOUS | Status: DC
  Administered 2019-08-01: 10 ug/min via INTRAVENOUS
  Administered 2019-08-02: 15 ug/min via INTRAVENOUS
  Filled 2019-08-01 (×2): qty 250

## 2019-08-01 MED ORDER — HEPARIN BOLUS VIA INFUSION
2000.0000 [IU] | Freq: Once | INTRAVENOUS | Status: AC
  Administered 2019-08-01: 2000 [IU] via INTRAVENOUS
  Filled 2019-08-01: qty 2000

## 2019-08-01 MED ORDER — SODIUM CHLORIDE 0.9 % IV SOLN: 250.0000 mL | INTRAVENOUS | Status: DC | PRN

## 2019-08-01 MED ORDER — CARVEDILOL 3.125 MG PO TABS
3.1250 mg | ORAL_TABLET | Freq: Two times a day (BID) | ORAL | Status: DC
  Administered 2019-08-01: 3.125 mg via ORAL
  Filled 2019-08-01: qty 1

## 2019-08-01 MED ORDER — ASPIRIN EC 81 MG PO TBEC
81.0000 mg | DELAYED_RELEASE_TABLET | Freq: Every day | ORAL | Status: DC
  Filled 2019-08-01: qty 1

## 2019-08-01 MED ORDER — ATORVASTATIN CALCIUM 40 MG PO TABS: 40.0000 mg | ORAL_TABLET | Freq: Every day | ORAL | Status: DC

## 2019-08-01 MED ORDER — SODIUM CHLORIDE 0.9% FLUSH: 3.0000 mL | INTRAVENOUS | Status: DC | PRN

## 2019-08-01 MED ORDER — ADULT MULTIVITAMIN W/MINERALS CH
1.0000 | ORAL_TABLET | Freq: Every day | ORAL | Status: DC
  Administered 2019-08-01: 1 via ORAL
  Filled 2019-08-01: qty 1

## 2019-08-01 MED ORDER — SODIUM CHLORIDE 0.9% FLUSH
3.0000 mL | Freq: Two times a day (BID) | INTRAVENOUS | Status: DC
  Administered 2019-08-01: 3 mL via INTRAVENOUS

## 2019-08-01 MED ORDER — ACETAMINOPHEN 325 MG PO TABS
650.0000 mg | ORAL_TABLET | ORAL | Status: DC | PRN
  Administered 2019-08-01 (×2): 650 mg via ORAL
  Filled 2019-08-01 (×3): qty 2

## 2019-08-01 MED ORDER — SODIUM CHLORIDE 0.9 % WEIGHT BASED INFUSION: 1.0000 mL/kg/h | INTRAVENOUS | Status: DC

## 2019-08-01 MED ORDER — NITROGLYCERIN 0.4 MG SL SUBL
0.4000 mg | SUBLINGUAL_TABLET | SUBLINGUAL | Status: DC | PRN
  Administered 2019-08-01: 0.4 mg via SUBLINGUAL
  Filled 2019-08-01: qty 1

## 2019-08-01 MED ORDER — SODIUM CHLORIDE 0.9 % WEIGHT BASED INFUSION: 3.0000 mL/kg/h | INTRAVENOUS | Status: DC

## 2019-08-01 MED ORDER — THIAMINE HCL 100 MG/ML IJ SOLN: 100.0000 mg | Freq: Every day | INTRAMUSCULAR | Status: DC

## 2019-08-01 MED ORDER — LOPERAMIDE HCL 2 MG PO CAPS
2.0000 mg | ORAL_CAPSULE | ORAL | Status: DC | PRN
  Administered 2019-08-01: 2 mg via ORAL
  Filled 2019-08-01: qty 1

## 2019-08-01 MED ORDER — LORAZEPAM 1 MG PO TABS: 1.0000 mg | ORAL_TABLET | ORAL | Status: DC | PRN

## 2019-08-01 MED ORDER — ASPIRIN 81 MG PO CHEW: 81.0000 mg | CHEWABLE_TABLET | ORAL | Status: DC

## 2019-08-01 MED ORDER — FOLIC ACID 1 MG PO TABS
1.0000 mg | ORAL_TABLET | Freq: Every day | ORAL | Status: DC
  Administered 2019-08-01: 1 mg via ORAL
  Filled 2019-08-01: qty 1

## 2019-08-01 NOTE — Progress Notes (Signed)
ANTICOAGULATION CONSULT NOTE - Follow-Up Consult  Pharmacy Consult for heparin Indication: chest pain/ACS  No Known Allergies  Patient Measurements: Height: 5\' 10"  (177.8 cm) Weight: 205 lb 11 oz (93.3 kg) IBW/kg (Calculated) : 73 Heparin Dosing Weight: 85 kg  Vital Signs: Temp: 98.9 F (37.2 C) (11/29 0800) Temp Source: Oral (11/29 0800) BP: 137/80 (11/29 0800) Pulse Rate: 80 (11/29 0800)  Labs: Recent Labs    07/31/19 2046 07/31/19 2227 07/31/19 2240 08/01/19 0154 08/01/19 0712  HGB 17.6*  --   --  16.8  --   HCT 50.5  --   --  46.7  --   PLT 178  --   --  157  --   LABPROT  --  13.0  --   --   --   INR  --  1.0  --   --   --   HEPARINUNFRC  --   --   --   --  <0.10*  CREATININE 0.83  --   --  0.90  --   TROPONINIHS 791*  --  728* 1,065* 1,257*    Estimated Creatinine Clearance: 105.1 mL/min (by C-G formula based on SCr of 0.9 mg/dL).   Medical History: Past Medical History:  Diagnosis Date  . Allergy      Assessment: 29 yoM admitted with several days of CP started on IV heparin. No AC PTA, CBC normal on admit. Initial heparin level undetectable, no infusion issues per RN.  Goal of Therapy:  Heparin level 0.3-0.7 units/ml Monitor platelets by anticoagulation protocol: Yes   Plan:  Heparin 2000 units x1 then increase to 1300 units/h Recheck heparin level in [place], PharmD, BCPS Clinical Pharmacist [phone] Please check AMION for all [place] numbers 08/01/2019

## 2019-08-01 NOTE — Progress Notes (Signed)
Pt having 4/10 chest pain with headache. bp 167/100. Gave 1 NTG sl.  And 650 mg tylenol.  Pt pain improving  Pt bp at 0656  = 148/100. Pt  Pain free at 0658 Will continue to monitor. Saunders Revel T

## 2019-08-01 NOTE — Progress Notes (Signed)
ANTICOAGULATION CONSULT NOTE - Follow-Up Consult  Pharmacy Consult for heparin Indication: chest pain/ACS  No Known Allergies  Patient Measurements: Height: 5\' 10"  (177.8 cm) Weight: 205 lb 11 oz (93.3 kg) IBW/kg (Calculated) : 73 Heparin Dosing Weight: 85 kg  Vital Signs: Temp: 98 F (36.7 C) (11/29 1658) Temp Source: Oral (11/29 1658) BP: 119/95 (11/29 1658) Pulse Rate: 106 (11/29 1658)  Labs: Recent Labs    07/31/19 2046 07/31/19 2227 07/31/19 2240 08/01/19 0154 08/01/19 0712 08/01/19 1722  HGB 17.6*  --   --  16.8  --   --   HCT 50.5  --   --  46.7  --   --   PLT 178  --   --  157  --   --   LABPROT  --  13.0  --   --   --   --   INR  --  1.0  --   --   --   --   HEPARINUNFRC  --   --   --   --  <0.10* 0.13*  CREATININE 0.83  --   --  0.90  --   --   TROPONINIHS 791*  --  728* 1,065* 1,257*  --     Estimated Creatinine Clearance: 105.1 mL/min (by C-G formula based on SCr of 0.9 mg/dL).   Medical History: Past Medical History:  Diagnosis Date  . Allergy    Assessment: 78 yoM admitted with several days of CP started on IV heparin. No AC PTA, CBC normal on admit.   Heparin level this evening came back subtherapeutic at 0.13, on 1300 units/hr. No s/sx of bleeding or infusion issues.   Goal of Therapy:  Heparin level 0.3-0.7 units/ml Monitor platelets by anticoagulation protocol: Yes   Plan:  Heparin 2000 units x1 then increase to 1550 units/h Recheck heparin level in 8hr Monitor daily HL, CBC, and for s/sx of bleeding  Antonietta Jewel, PharmD, [place] Pharmacist  Phone: [phone]  Please check AMION for all [place] phone numbers After 10:00 PM, call [place] [phone] Please check AMION for all [place] numbers 08/01/2019

## 2019-08-01 NOTE — Progress Notes (Signed)
  Echocardiogram 2D Echocardiogram has been performed with [facility].  Barry Gutierrez 08/01/2019, 4:51 PM

## 2019-08-01 NOTE — Progress Notes (Signed)
Progress Note  Patient Name: Barry Gutierrez Date of Encounter: 08/01/2019  Primary Cardiologist: New No primary care provider on file.  Subjective   Chest pain is mild now, just 1/10. Also w/ some stress due to no tobacco>>patch and gum ordered Says he drinks a fifth of bourbon q 3 days, feels anxious because has not had a cig or a drink since last pm  Inpatient Medications    Scheduled Meds: . aspirin EC  81 mg Oral Daily  . atorvastatin  40 mg Oral q1800  . carvedilol  3.125 mg Oral BID WC  . heparin  2,000 Units Intravenous Once  . nicotine  14 mg Transdermal Daily   Continuous Infusions: . heparin 1,000 Units/hr (08/01/19 0100)   PRN Meds: acetaminophen, nicotine polacrilex, nitroGLYCERIN, ondansetron (ZOFRAN) IV   Vital Signs    Vitals:   08/01/19 0400 08/01/19 0609 08/01/19 0651 08/01/19 0800  BP: 125/89 (!) 153/107 (!) 167/100 137/80  Pulse: 79 89 85 80  Resp: 15  15 12   Temp: 97.6 F (36.4 C)   98.9 F (37.2 C)  TempSrc: Oral   Oral  SpO2: 94%  96% 93%  Weight:      Height:        Intake/Output Summary (Last 24 hours) at 08/01/2019 1028 Last data filed at 08/01/2019 0600 Gross per 24 hour  Intake 154.71 ml  Output 250 ml  Net -95.29 ml   Filed Weights   08/01/19 0119  Weight: 93.3 kg   Last Weight  Most recent update: 08/01/2019  1:19 AM   Weight  93.3 kg (205 lb 11 oz)           Weight change:    Telemetry    SR  - Personally Reviewed  ECG    11/28 ECG w/ mild lateral ST depression and inf T wave changes, improved on 11/29 ECG,  - Personally Reviewed  Physical Exam   General: Well developed, well nourished, male appearing in no acute distress. Head: Normocephalic, atraumatic.  Neck: Supple without bruits, JVD not elevated. Lungs:  Resp regular and unlabored, scattered dry rales. Heart: RRR, S1, S2, no S3, S4, or murmur; no rub. Abdomen: Soft, non-tender, non-distended with normoactive bowel sounds. No hepatomegaly. No  rebound/guarding. No obvious abdominal masses. Extremities: No clubbing, cyanosis, no edema. Distal pedal pulses are 2+ bilaterally. Neuro: Alert and oriented X 3. Moves all extremities spontaneously. Psych: Normal affect.  Labs    Hematology Recent Labs  Lab 07/31/19 2046 08/01/19 0154  WBC 8.9 8.2  RBC 5.25 4.95  HGB 17.6* 16.8  HCT 50.5 46.7  MCV 96.2 94.3  MCH 33.5 33.9  MCHC 34.9 36.0  RDW 12.4 12.4  PLT 178 157    Chemistry Recent Labs  Lab 07/31/19 2046 07/31/19 2240 08/01/19 0154  NA 139  --  141  K 3.5  --  3.8  CL 106  --  106  CO2 22  --  22  GLUCOSE 122*  --  116*  BUN 5*  --  6  CREATININE 0.83  --  0.90  CALCIUM 9.1  --  9.0  PROT  --  6.7  --   ALBUMIN  --  3.9  --   AST  --  46*  --   ALT  --  39  --   ALKPHOS  --  61  --   BILITOT  --  1.4*  --   GFRNONAA >60  --  >60  GFRAA >60  --  >60  ANIONGAP 11  --  13     High Sensitivity Troponin:   Recent Labs  Lab 07/31/19 2046 07/31/19 2240 08/01/19 0154 08/01/19 0712  TROPONINIHS 791* 728* 1,065* 1,257*      BNP Recent Labs  Lab 07/31/19 2240  BNP 313.9*   Lab Results  Component Value Date   CHOL 212 (H) 08/01/2019   HDL 46 08/01/2019   LDLCALC 128 (H) 08/01/2019   TRIG 191 (H) 08/01/2019   CHOLHDL 4.6 08/01/2019   No results found for: TSH Lab Results  Component Value Date   HGBA1C 5.4 08/01/2019      Radiology    Dg Chest 2 View  Result Date: 07/31/2019 CLINICAL DATA:  Chest pain EXAM: CHEST - 2 VIEW COMPARISON:  Chest radiograph 11/12/2017 FINDINGS: Normal cardiac and mediastinal contours. No consolidative pulmonary opacities. No pleural effusion or pneumothorax. Thoracic spine degenerative changes. IMPRESSION: No acute cardiopulmonary process. Electronically Signed   By: Annia Belt M.D.   On: 07/31/2019 21:08     Cardiac Studies   ECHO:  Will order  Patient Profile     58 y.o. male w/ no cardiac hx, hx tob/ETOH use, admitted 11/27 w/ NSTEMI  Assessment &  Plan    1. NSTEMI - still w/ mild pain, add IV Nitro - increase Lipitor to 80 mg qd - increase Coreg to 6.25 mg bid - continue heparin - Cardiac catheterization was discussed with the patient fully. The patient understands that risks include but are not limited to stroke (1 in 1000), death (1 in 1000), kidney failure [usually temporary] (1 in 500), bleeding (1 in 200), allergic reaction [possibly serious] (1 in 200).  The patient understands and is willing to proceed.   - will put on cath board and write orders  2. Hyperlipidemia, goal LDL < 70 - increase Lipitor, recheck LFTs in am since AST mildly elevated  3. HTN - SBP on admit 175/115 - improved w/ Coreg, increase dose  4. ETOH/Tobacco - will start CIWA protocol - pt denies ever having DTs, but is anxious - continue nicotine patch and gum   Principal Problem:   NSTEMI (non-ST elevated myocardial infarction) [facility]) Active Problems:   Cigarette smoker    Melida Quitter , PA-C 10:28 AM 08/01/2019 Pager: [phone]

## 2019-08-01 NOTE — Progress Notes (Signed)
ANTICOAGULATION CONSULT NOTE - Initial Consult  Pharmacy Consult for heparin Indication: chest pain/ACS  No Known Allergies  Patient Measurements:   Heparin Dosing Weight: 85 kg  Vital Signs: Temp: 99.2 F (37.3 C) (11/28 2034) Temp Source: Oral (11/28 2034) BP: 129/79 (11/29 0000) Pulse Rate: 85 (11/29 0000)  Labs: Recent Labs    07/31/19 2046 07/31/19 2227 07/31/19 2240  HGB 17.6*  --   --   HCT 50.5  --   --   PLT 178  --   --   LABPROT  --  13.0  --   INR  --  1.0  --   CREATININE 0.83  --   --   TROPONINIHS 791*  --  728*    CrCl cannot be calculated (Unknown ideal weight.).   Medical History: Past Medical History:  Diagnosis Date  . Allergy     Medications:  See medication history  Assessment: 58 yo man to start heparin for CP.  He was not on anticoagulation PTA  Hg 17.6, PTLC 170 Goal of Therapy:  Heparin level 0.3-0.7 units/ml Monitor platelets by anticoagulation protocol: Yes   Plan:  Heparin 4000 unit bolus and drip at 1000 units/hr Check heparin level in 6-8 hours Daily HL and CBC while on heparin Monitor for bleeding complications  Barry Gutierrez 08/01/2019,12:23 AM

## 2019-08-02 ENCOUNTER — Encounter (HOSPITAL_COMMUNITY): Payer: Self-pay | Admitting: Emergency Medicine

## 2019-08-02 ENCOUNTER — Emergency Department (HOSPITAL_COMMUNITY): Payer: Self-pay

## 2019-08-02 ENCOUNTER — Other Ambulatory Visit: Payer: Self-pay

## 2019-08-02 ENCOUNTER — Encounter (HOSPITAL_COMMUNITY)
Admission: EM | Disposition: A | Discharge status: Home / Self Care | Payer: Self-pay | Source: Home / Self Care | Attending: Internal Medicine

## 2019-08-02 ENCOUNTER — Inpatient Hospital Stay (HOSPITAL_COMMUNITY)
Admission: EM | Admit: 2019-08-02 | Discharge: 2019-08-04 | DRG: 281 | Disposition: A | Discharge status: Home / Self Care | Payer: Self-pay | Attending: Internal Medicine | Admitting: Internal Medicine

## 2019-08-02 DIAGNOSIS — E785 Hyperlipidemia, unspecified: Secondary | ICD-10-CM | POA: Diagnosis present

## 2019-08-02 DIAGNOSIS — R4182 Altered mental status, unspecified: Secondary | ICD-10-CM

## 2019-08-02 DIAGNOSIS — Z20828 Contact with and (suspected) exposure to other viral communicable diseases: Secondary | ICD-10-CM | POA: Diagnosis present

## 2019-08-02 DIAGNOSIS — J4521 Mild intermittent asthma with (acute) exacerbation: Secondary | ICD-10-CM

## 2019-08-02 DIAGNOSIS — E663 Overweight: Secondary | ICD-10-CM | POA: Diagnosis present

## 2019-08-02 DIAGNOSIS — R079 Chest pain, unspecified: Secondary | ICD-10-CM

## 2019-08-02 DIAGNOSIS — Z8249 Family history of ischemic heart disease and other diseases of the circulatory system: Secondary | ICD-10-CM

## 2019-08-02 DIAGNOSIS — F17210 Nicotine dependence, cigarettes, uncomplicated: Secondary | ICD-10-CM | POA: Diagnosis present

## 2019-08-02 DIAGNOSIS — F10231 Alcohol dependence with withdrawal delirium: Secondary | ICD-10-CM | POA: Diagnosis present

## 2019-08-02 DIAGNOSIS — I5030 Unspecified diastolic (congestive) heart failure: Secondary | ICD-10-CM | POA: Diagnosis present

## 2019-08-02 DIAGNOSIS — I10 Essential (primary) hypertension: Secondary | ICD-10-CM | POA: Diagnosis present

## 2019-08-02 DIAGNOSIS — F1010 Alcohol abuse, uncomplicated: Secondary | ICD-10-CM | POA: Diagnosis present

## 2019-08-02 DIAGNOSIS — I214 Non-ST elevation (NSTEMI) myocardial infarction: Principal | ICD-10-CM | POA: Diagnosis present

## 2019-08-02 DIAGNOSIS — Z79891 Long term (current) use of opiate analgesic: Secondary | ICD-10-CM

## 2019-08-02 DIAGNOSIS — F10931 Alcohol use, unspecified with withdrawal delirium: Secondary | ICD-10-CM

## 2019-08-02 DIAGNOSIS — I110 Hypertensive heart disease with heart failure: Secondary | ICD-10-CM | POA: Diagnosis present

## 2019-08-02 DIAGNOSIS — I2510 Atherosclerotic heart disease of native coronary artery without angina pectoris: Secondary | ICD-10-CM

## 2019-08-02 DIAGNOSIS — Z79899 Other long term (current) drug therapy: Secondary | ICD-10-CM

## 2019-08-02 DIAGNOSIS — Z6828 Body mass index (BMI) 28.0-28.9, adult: Secondary | ICD-10-CM

## 2019-08-02 DIAGNOSIS — Z833 Family history of diabetes mellitus: Secondary | ICD-10-CM

## 2019-08-02 DIAGNOSIS — I200 Unstable angina: Secondary | ICD-10-CM | POA: Diagnosis present

## 2019-08-02 LAB — COMPREHENSIVE METABOLIC PANEL
ALT: 34 U/L (ref 0–44)
AST: 42 U/L — ABNORMAL HIGH (ref 15–41)
Albumin: 3.6 g/dL (ref 3.5–5.0)
Alkaline Phosphatase: 54 U/L (ref 38–126)
Anion gap: 12 (ref 5–15)
BUN: 9 mg/dL (ref 6–20)
CO2: 22 mmol/L (ref 22–32)
Calcium: 8.9 mg/dL (ref 8.9–10.3)
Chloride: 102 mmol/L (ref 98–111)
Creatinine, Ser: 0.97 mg/dL (ref 0.61–1.24)
GFR calc Af Amer: >60 mL/min (ref >60)
GFR calc non Af Amer: >60 mL/min (ref >60)
Glucose, Bld: 116 mg/dL — ABNORMAL HIGH (ref 70–99)
Potassium: 3.5 mmol/L (ref 3.5–5.1)
Sodium: 136 mmol/L (ref 135–145)
Total Bilirubin: 1.5 mg/dL — ABNORMAL HIGH (ref 0.3–1.2)
Total Protein: 6.3 g/dL — ABNORMAL LOW (ref 6.5–8.1)

## 2019-08-02 LAB — CBC
HCT: 44.1 % (ref 39.0–52.0)
Hemoglobin: 15.8 g/dL (ref 13.0–17.0)
MCH: 33.8 pg (ref 26.0–34.0)
MCHC: 35.8 g/dL (ref 30.0–36.0)
MCV: 94.4 fL (ref 80.0–100.0)
Platelets: 150 10*3/uL (ref 150–400)
RBC: 4.67 MIL/uL (ref 4.22–5.81)
RDW: 12.5 % (ref 11.5–15.5)
WBC: 10.1 10*3/uL (ref 4.0–10.5)
nRBC: 0 % (ref 0.0–0.2)

## 2019-08-02 LAB — TROPONIN I (HIGH SENSITIVITY)
Troponin I (High Sensitivity): 1333 ng/L (ref <18)
Troponin I (High Sensitivity): 2329 ng/L (ref <18)

## 2019-08-02 LAB — PROTIME-INR
INR: 1 (ref 0.8–1.2)
Prothrombin Time: 13.5 seconds (ref 11.4–15.2)

## 2019-08-02 LAB — BASIC METABOLIC PANEL
Anion gap: 11 (ref 5–15)
BUN: 10 mg/dL (ref 6–20)
CO2: 25 mmol/L (ref 22–32)
Calcium: 9.5 mg/dL (ref 8.9–10.3)
Chloride: 101 mmol/L (ref 98–111)
Creatinine, Ser: 0.92 mg/dL (ref 0.61–1.24)
GFR calc Af Amer: >60 mL/min (ref >60)
GFR calc non Af Amer: >60 mL/min (ref >60)
Glucose, Bld: 117 mg/dL — ABNORMAL HIGH (ref 70–99)
Potassium: 4.7 mmol/L (ref 3.5–5.1)
Sodium: 137 mmol/L (ref 135–145)

## 2019-08-02 LAB — APTT: aPTT: 121 seconds — ABNORMAL HIGH (ref 24–36)

## 2019-08-02 LAB — HEPARIN LEVEL (UNFRACTIONATED): Heparin Unfractionated: 0.15 IU/mL — ABNORMAL LOW (ref 0.30–0.70)

## 2019-08-02 SURGERY — INVASIVE LAB ABORTED CASE

## 2019-08-02 SURGERY — LEFT HEART CATH AND CORONARY ANGIOGRAPHY
Anesthesia: LOCAL

## 2019-08-02 MED ORDER — ATORVASTATIN CALCIUM 80 MG PO TABS
80.0000 mg | ORAL_TABLET | Freq: Every day | ORAL | Status: DC
  Administered 2019-08-03: 80 mg via ORAL
  Filled 2019-08-02: qty 1

## 2019-08-02 MED ORDER — ALPRAZOLAM 0.25 MG PO TABS: 0.2500 mg | ORAL_TABLET | Freq: Two times a day (BID) | ORAL | Status: DC | PRN

## 2019-08-02 MED ORDER — SODIUM CHLORIDE 0.9% FLUSH: 3.0000 mL | INTRAVENOUS | Status: DC | PRN

## 2019-08-02 MED ORDER — PANTOPRAZOLE SODIUM 40 MG PO TBEC
40.0000 mg | DELAYED_RELEASE_TABLET | Freq: Every day | ORAL | Status: DC
  Administered 2019-08-04: 40 mg via ORAL
  Filled 2019-08-02: qty 1

## 2019-08-02 MED ORDER — LORAZEPAM 2 MG/ML IJ SOLN
1.0000 mg | Freq: Once | INTRAMUSCULAR | Status: AC
  Administered 2019-08-02: 1 mg via INTRAVENOUS
  Filled 2019-08-02: qty 1

## 2019-08-02 MED ORDER — DEXMEDETOMIDINE HCL IN NACL 200 MCG/50ML IV SOLN
0.4000 ug/kg/h | INTRAVENOUS | Status: DC
  Administered 2019-08-02: 0.9 ug/kg/h via INTRAVENOUS
  Administered 2019-08-02: 0.7 ug/kg/h via INTRAVENOUS
  Filled 2019-08-02 (×4): qty 50

## 2019-08-02 MED ORDER — SODIUM CHLORIDE 0.9 % IV SOLN: 250.0000 mL | INTRAVENOUS | Status: DC | PRN

## 2019-08-02 MED ORDER — ASPIRIN 81 MG PO CHEW: 324.0000 mg | CHEWABLE_TABLET | ORAL | Status: AC

## 2019-08-02 MED ORDER — SODIUM CHLORIDE 0.9% FLUSH
3.0000 mL | Freq: Two times a day (BID) | INTRAVENOUS | Status: DC
  Administered 2019-08-04: 3 mL via INTRAVENOUS

## 2019-08-02 MED ORDER — HALOPERIDOL LACTATE 5 MG/ML IJ SOLN
5.0000 mg | Freq: Once | INTRAMUSCULAR | Status: AC
  Administered 2019-08-02: 5 mg via INTRAMUSCULAR

## 2019-08-02 MED ORDER — MIDAZOLAM HCL 2 MG/2ML IJ SOLN
INTRAMUSCULAR | Status: DC | PRN
  Administered 2019-08-02: 2 mg via INTRAVENOUS

## 2019-08-02 MED ORDER — LORAZEPAM 1 MG PO TABS
1.0000 mg | ORAL_TABLET | Freq: Once | ORAL | Status: AC
  Administered 2019-08-02: 14:00:00 1 mg via ORAL
  Filled 2019-08-02: qty 1

## 2019-08-02 MED ORDER — ZOLPIDEM TARTRATE 5 MG PO TABS: 5.0000 mg | ORAL_TABLET | Freq: Every evening | ORAL | Status: DC | PRN

## 2019-08-02 MED ORDER — HEPARIN BOLUS VIA INFUSION
4000.0000 [IU] | Freq: Once | INTRAVENOUS | Status: AC
  Administered 2019-08-02: 4000 [IU] via INTRAVENOUS
  Filled 2019-08-02: qty 4000

## 2019-08-02 MED ORDER — FENTANYL CITRATE (PF) 100 MCG/2ML IJ SOLN
INTRAMUSCULAR | Status: AC
  Filled 2019-08-02: qty 2

## 2019-08-02 MED ORDER — NITROGLYCERIN IN D5W 200-5 MCG/ML-% IV SOLN
0.0000 ug/min | INTRAVENOUS | Status: DC
  Administered 2019-08-02 (×2): 5 ug/min via INTRAVENOUS
  Filled 2019-08-02: qty 250

## 2019-08-02 MED ORDER — DEXMEDETOMIDINE HCL IN NACL 400 MCG/100ML IV SOLN
0.4000 ug/kg/h | INTRAVENOUS | Status: DC
  Administered 2019-08-02: 0.9 ug/kg/h via INTRAVENOUS
  Filled 2019-08-02 (×2): qty 100

## 2019-08-02 MED ORDER — LIDOCAINE HCL (PF) 1 % IJ SOLN
INTRAMUSCULAR | Status: AC
  Filled 2019-08-02: qty 30

## 2019-08-02 MED ORDER — SODIUM CHLORIDE 0.9% FLUSH: 3.0000 mL | Freq: Two times a day (BID) | INTRAVENOUS | Status: DC

## 2019-08-02 MED ORDER — LORAZEPAM 2 MG/ML IJ SOLN
1.0000 mg | INTRAMUSCULAR | Status: DC | PRN
  Administered 2019-08-03 – 2019-08-04 (×3): 2 mg via INTRAVENOUS
  Filled 2019-08-02 (×3): qty 1

## 2019-08-02 MED ORDER — ASPIRIN 300 MG RE SUPP: 300.0000 mg | RECTAL | Status: AC

## 2019-08-02 MED ORDER — HEPARIN (PORCINE) 25000 UT/250ML-% IV SOLN
2000.0000 [IU]/h | INTRAVENOUS | Status: DC
  Administered 2019-08-02 – 2019-08-03 (×4): 1600 [IU]/h via INTRAVENOUS
  Administered 2019-08-03: 1900 [IU]/h via INTRAVENOUS
  Administered 2019-08-04 (×2): 2000 [IU]/h via INTRAVENOUS
  Filled 2019-08-02 (×5): qty 250

## 2019-08-02 MED ORDER — CHLORHEXIDINE GLUCONATE CLOTH 2 % EX PADS
6.0000 | MEDICATED_PAD | Freq: Every day | CUTANEOUS | Status: DC
  Administered 2019-08-03: 6 via TOPICAL

## 2019-08-02 MED ORDER — MORPHINE SULFATE (PF) 4 MG/ML IV SOLN
4.0000 mg | Freq: Once | INTRAVENOUS | Status: DC
  Filled 2019-08-02: qty 1

## 2019-08-02 MED ORDER — LORAZEPAM 1 MG PO TABS: 1.0000 mg | ORAL_TABLET | ORAL | Status: DC | PRN

## 2019-08-02 MED ORDER — LORAZEPAM 2 MG/ML IJ SOLN
0.0000 mg | Freq: Four times a day (QID) | INTRAMUSCULAR | Status: DC
  Administered 2019-08-02: 2 mg via INTRAVENOUS
  Filled 2019-08-02: qty 1

## 2019-08-02 MED ORDER — LORAZEPAM 2 MG/ML IJ SOLN
1.0000 mg | INTRAMUSCULAR | Status: DC | PRN
  Administered 2019-08-02 (×2): 2 mg via INTRAVENOUS
  Filled 2019-08-02 (×2): qty 1

## 2019-08-02 MED ORDER — ONDANSETRON HCL 4 MG/2ML IJ SOLN: 4.0000 mg | Freq: Four times a day (QID) | INTRAMUSCULAR | Status: DC | PRN

## 2019-08-02 MED ORDER — VITAMIN B-1 100 MG PO TABS
100.0000 mg | ORAL_TABLET | Freq: Every day | ORAL | Status: DC
  Administered 2019-08-04: 100 mg via ORAL
  Filled 2019-08-02 (×2): qty 1

## 2019-08-02 MED ORDER — HEPARIN (PORCINE) IN NACL 1000-0.9 UT/500ML-% IV SOLN
INTRAVENOUS | Status: DC | PRN
  Administered 2019-08-02 (×2): 500 mL

## 2019-08-02 MED ORDER — LORAZEPAM 2 MG/ML IJ SOLN: 0.0000 mg | Freq: Two times a day (BID) | INTRAMUSCULAR | Status: DC

## 2019-08-02 MED ORDER — NITROGLYCERIN 0.4 MG SL SUBL: 0.4000 mg | SUBLINGUAL_TABLET | SUBLINGUAL | Status: DC | PRN

## 2019-08-02 MED ORDER — MIDAZOLAM HCL 2 MG/2ML IJ SOLN
INTRAMUSCULAR | Status: AC
  Filled 2019-08-02: qty 2

## 2019-08-02 MED ORDER — ASPIRIN EC 81 MG PO TBEC
81.0000 mg | DELAYED_RELEASE_TABLET | Freq: Every day | ORAL | Status: DC
  Administered 2019-08-04: 81 mg via ORAL
  Filled 2019-08-02: qty 1

## 2019-08-02 MED ORDER — HALOPERIDOL LACTATE 5 MG/ML IJ SOLN
INTRAMUSCULAR | Status: AC
  Filled 2019-08-02: qty 1

## 2019-08-02 MED ORDER — ASPIRIN 81 MG PO CHEW
324.0000 mg | CHEWABLE_TABLET | Freq: Once | ORAL | Status: AC
  Administered 2019-08-02: 324 mg via ORAL
  Filled 2019-08-02: qty 4

## 2019-08-02 MED ORDER — ACETAMINOPHEN 325 MG PO TABS
650.0000 mg | ORAL_TABLET | ORAL | Status: DC | PRN
  Administered 2019-08-03 – 2019-08-04 (×2): 650 mg via ORAL
  Filled 2019-08-02 (×3): qty 2

## 2019-08-02 MED ORDER — SODIUM CHLORIDE 0.9% FLUSH
3.0000 mL | Freq: Once | INTRAVENOUS | Status: AC
  Administered 2019-08-02: 3 mL via INTRAVENOUS

## 2019-08-02 MED ORDER — SODIUM CHLORIDE 0.9 % IV SOLN
INTRAVENOUS | Status: AC | PRN
  Administered 2019-08-02: 1 mL/kg/h via INTRAVENOUS

## 2019-08-02 MED ORDER — CARVEDILOL 6.25 MG PO TABS
6.2500 mg | ORAL_TABLET | Freq: Two times a day (BID) | ORAL | Status: DC
  Administered 2019-08-03 – 2019-08-04 (×2): 6.25 mg via ORAL
  Filled 2019-08-02 (×2): qty 1

## 2019-08-02 MED ORDER — HEPARIN (PORCINE) IN NACL 1000-0.9 UT/500ML-% IV SOLN
INTRAVENOUS | Status: AC
  Filled 2019-08-02: qty 1000

## 2019-08-02 MED ORDER — FOLIC ACID 1 MG PO TABS: 1.0000 mg | ORAL_TABLET | Freq: Every day | ORAL | Status: DC

## 2019-08-02 MED ORDER — THIAMINE HCL 100 MG/ML IJ SOLN
100.0000 mg | Freq: Every day | INTRAMUSCULAR | Status: DC
  Administered 2019-08-03: 100 mg via INTRAVENOUS
  Filled 2019-08-02: qty 2

## 2019-08-02 MED ORDER — ADULT MULTIVITAMIN W/MINERALS CH: 1.0000 | ORAL_TABLET | Freq: Every day | ORAL | Status: DC

## 2019-08-02 MED ORDER — FENTANYL CITRATE (PF) 100 MCG/2ML IJ SOLN
INTRAMUSCULAR | Status: DC | PRN
  Administered 2019-08-02: 25 ug via INTRAVENOUS

## 2019-08-02 SURGICAL SUPPLY — 8 items
GLIDESHEATH SLEND SS 6F .021 (SHEATH) ×2 IMPLANT
GUIDEWIRE INQWIRE 1.5J.035X260 (WIRE) ×1 IMPLANT
INQWIRE 1.5J .035X260CM (WIRE) ×3
KIT HEART LEFT (KITS) ×3 IMPLANT
PACK CARDIAC CATHETERIZATION (CUSTOM PROCEDURE TRAY) ×3 IMPLANT
SYR MEDRAD MARK 7 150ML (SYRINGE) ×3 IMPLANT
TRANSDUCER W/STOPCOCK (MISCELLANEOUS) ×3 IMPLANT
TUBING CIL FLEX 10 FLL-RA (TUBING) ×3 IMPLANT

## 2019-08-02 NOTE — Progress Notes (Signed)
Patient's mother called to get update on patient. Updated on patient's condition. 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 Patient sleeping at this time.

## 2019-08-02 NOTE — ED Notes (Signed)
Pt increasingly becoming agitated again, stated "You people are doing nothing and I've been having a heart attack for several days".  This RN attempted to diffuse the situation but patient agitated and demending to go to surgery.

## 2019-08-02 NOTE — ED Triage Notes (Signed)
After being called multiple times in lobby, pt answers. Pt in with cp, left AMA last night from [place]. States he was supposed to get heart cath today, but got tired of being in hospital. Arrives with worsened cp and sob

## 2019-08-02 NOTE — ED Notes (Signed)
Pt. Found standing in doorway all wires off. Pt. Returned to bed and placed back on monitor. Pt. Complained of discomfort d/t cp.

## 2019-08-02 NOTE — Progress Notes (Signed)
Patient combative. Security remains in room, MD at bedside. Received verbal order for haldol 5mg  IM. Rapid response at beside attempting to start new IV. Will continue to monitor   8:19 PM

## 2019-08-02 NOTE — Progress Notes (Signed)
Patient jumping out of bed. Ripped off tele wires. Yelling and cursing. Security called at this time  MD paged at this time

## 2019-08-02 NOTE — Progress Notes (Addendum)
ANTICOAGULATION CONSULT NOTE - Initial Consult  Pharmacy Consult for heparin  Indication: chest pain/ACS  No Known Allergies  Patient Measurements: Weight: 205 lb 11 oz (93.3 kg) Heparin Dosing Weight: 91.9kg   Vital Signs: Temp: 98.6 F (37 C) (11/30 0737) Temp Source: Oral (11/30 0737) BP: 172/106 (11/30 0830) Pulse Rate: 95 (11/30 0804)  Labs: Recent Labs    07/31/19 2046 07/31/19 2227  08/01/19 0154 08/01/19 0712 08/01/19 1722 08/02/19 0222 08/02/19 0601  HGB 17.6*  --   --  16.8  --   --  15.8  --   HCT 50.5  --   --  46.7  --   --  44.1  --   PLT 178  --   --  157  --   --  150  --   LABPROT  --  13.0  --   --   --   --   --   --   INR  --  1.0  --   --   --   --   --   --   HEPARINUNFRC  --   --   --   --  <0.10* 0.13* 0.15*  --   CREATININE 0.83  --   --  0.90  --   --  0.97 0.92  TROPONINIHS 791*  --    < > 1,065* 1,257*  --   --  1,333*   < > = values in this interval not displayed.    Estimated Creatinine Clearance: 102.8 mL/min (by C-G formula based on SCr of 0.92 mg/dL).   Medical History: Past Medical History:  Diagnosis Date  . Allergy    Assessment: 58 yo male presented on 08/02/2019 after leaving AMA early this AM. Patient represents with worsening chest pain and SOB. Patient was suppose to go for cardiac cath today. Pharmacy consulted to dose heparin for ACS. Patient was previously subtherapeutic on heparin 1550 units/hr but unclear if this if before or after he removed his IVs. Troponin 1333. CBC stable. No reported bleeding. No anticoagulation prior to admission.   Goal of Therapy:  Heparin level 0.3-0.7 units/ml Monitor platelets by anticoagulation protocol: Yes   Plan:  Heparin 4000 units x1 Start heparin 1600 units/hr Check heparin level at 1530  Follow up cardiology plans for cardiac cath  Monitor heparin level, CBC, and S/S of bleeding daily  Addendum: Patient turned off his heparin and it was off for an unknown time. Per RN plan  is for cardiac cath today but has not been scheduled yet. Heparin level scheduled for 1530 cancelled and will follow up cardiac cath and re-timing heparin level.   Cristela Felt, PharmD PGY1 Pharmacy Resident [employer]: [phone]  08/02/2019,8:41 AM

## 2019-08-02 NOTE — Consult Note (Addendum)
NAME:  Barry Gutierrez, MRN:  [id number], DOB:  04-15-1961, LOS: 0 ADMISSION DATE:  08/02/2019, CONSULTATION DATE:  11/30 REFERRING MD:  Dr. Jacques Navy (cardiology), CHIEF COMPLAINT:  ICU   Brief History   58 year old male with ETOH history admitted for NSTEMI. Unable to undergo LHC due to withdrawal symptoms. Precedex infusion started and transfer to ICU  History of present illness   58 year old male with PMH as below, which is significant for ETOH, admitted on 11/28 for chest pain x several days and ruled in for NSTEMI with troponin peaking at 1200. He was treated with IV heparin and nitroglycerine, and was scheduled for Cath on 11/30. However, he left AMA, in the early AM hours of 11/30. He then presented again to the ED around 0430 AM that same day when chest pain became worse. He was re-admitted by the cardiology service and taken for cardiac cath, however, he became combative and agitated. The procedure was unable to be completed. He was treated with CIWA protocol for presumed alcohol withdrawal. Around 2000 on 11/30 he became combative ripping out IV lines. He was given 4mg  ativan and 5mg  haldol prior to calming down. He was then started on precedex and was transferred to ICU.  Past Medical History   has a past medical history of Allergy.   Significant Hospital Events   11/28 admit for STEMI 11/30 left AMA, came back, went for cath but unable due to combativeness. Precedex to ICU.   Consults:    Procedures:    Significant Diagnostic Tests:    Micro Data:    Antimicrobials:     Interim history/subjective:    Objective   Blood pressure (!) 165/93, pulse (!) 103, temperature 98.3 F (36.8 C), temperature source Oral, resp. rate 20, height 5\' 10"  (1.778 m), weight 93.3 kg, SpO2 96 %.    FiO2 (%):  [28 %] 28 %   Intake/Output Summary (Last 24 hours) at 08/02/2019 2055 Last data filed at 08/02/2019 1800 Gross per 24 hour  Intake 291.97 ml  Output 300 ml  Net -8.03 ml    Filed Weights   08/02/19 0553 08/02/19 0830  Weight: 93.3 kg 93.3 kg    Examination: General: Overweight middle aged male HENT: [place]/AT, PERRL Lungs: Clear, sonorous respirations Cardiovascular: Tachy. Regular, no MRG Abdomen: Soft, non-tender, non-distended Extremities: No acute deformity or ROM limitation Neuro: Sedate  Resolved Hospital Problem list     Assessment & Plan:   NSTEMI - management per cardiology - ASA, heparin infusion  Alcohol withdrawal - transfer to ICU for precedex infusion - RASS goal -1 to -2 - PRN ativan - Thiamine, folic acid  HTN - Management per cardiology (coreg)  Best practice:  Diet: NPO Pain/Anxiety/Delirium protocol (if indicated): Precedex VAP protocol (if indicated): NA DVT prophylaxis: Heparin infusion for NSTEMI GI prophylaxis: per primary Glucose control: NA Mobility: BR Code Status: FULL Family Communication: per primary Disposition: ICU  Labs   CBC: Recent Labs  Lab 07/31/19 2046 08/01/19 0154 08/02/19 0222  WBC 8.9 8.2 10.1  HGB 17.6* 16.8 15.8  HCT 50.5 46.7 44.1  MCV 96.2 94.3 94.4  PLT 178 157 150    Basic Metabolic Panel: Recent Labs  Lab 07/31/19 2046 08/01/19 0154 08/01/19 1313 08/02/19 0222 08/02/19 0601  NA 139 141  --  136 137  K 3.5 3.8  --  3.5 4.7  CL 106 106  --  102 101  CO2 22 22  --  22 25  GLUCOSE 122* 116*  --  116* 117*  BUN 5* 6  --  9 10  CREATININE 0.83 0.90  --  0.97 0.92  CALCIUM 9.1 9.0  --  8.9 9.5  MG  --   --  2.1  --   --   PHOS  --   --  2.8  --   --    GFR: Estimated Creatinine Clearance: 102.8 mL/min (by C-G formula based on SCr of 0.92 mg/dL). Recent Labs  Lab 07/31/19 2046 08/01/19 0154 08/02/19 0222  WBC 8.9 8.2 10.1    Liver Function Tests: Recent Labs  Lab 07/31/19 2240 08/02/19 0222  AST 46* 42*  ALT 39 34  ALKPHOS 61 54  BILITOT 1.4* 1.5*  PROT 6.7 6.3*  ALBUMIN 3.9 3.6   No results for input(s): LIPASE, AMYLASE in the last 168 hours. No  results for input(s): AMMONIA in the last 168 hours.  ABG No results found for: PHART, PCO2ART, PO2ART, HCO3, TCO2, ACIDBASEDEF, O2SAT   Coagulation Profile: Recent Labs  Lab 07/31/19 2227 08/02/19 0943  INR 1.0 1.0    Cardiac Enzymes: No results for input(s): CKTOTAL, CKMB, CKMBINDEX, TROPONINI in the last 168 hours.  HbA1C: Hgb A1c MFr Bld  Date/Time Value Ref Range Status  08/01/2019 01:54 AM 5.4 4.8 - 5.6 % Final    Comment:    (NOTE) Pre diabetes:          5.7%-6.4% Diabetes:              >6.4% Glycemic control for   <7.0% adults with diabetes     CBG: No results for input(s): GLUCAP in the last 168 hours.  Review of Systems:   Unable as patient is encephalopathic  Past Medical History  He,  has a past medical history of Allergy.   Surgical History   History reviewed. No pertinent surgical history.   Social History   reports that he has been smoking cigarettes. He has been smoking about 2.00 packs per day. He has never used smokeless tobacco. He reports current alcohol use. He reports current drug use. Drug: Marijuana.   Family History   His family history includes CAD in his father and mother; Cancer in his mother; Coronary artery disease in his father and mother; Diabetes in his father.   Allergies No Known Allergies   Home Medications  Prior to Admission medications   Medication Sig Start Date End Date Taking? Authorizing Provider  acetaminophen (TYLENOL) 325 MG tablet Take 325-650 mg by mouth every 6 (six) hours as needed (for pain).    [provider]  clotrimazole (LOTRIMIN) 1 % cream Apply 1 application topically 2 (two) times daily. Patient not taking: Reported on 07/31/2019 04/08/18   Benjiman CoreWiseman, Brittany D, PA-C  famotidine (PEPCID) 20 MG tablet One at bedtime Patient taking differently: Take 20-80 mg by mouth 2 (two) times daily as needed for heartburn or indigestion.  11/12/17   Nyoka CowdenWert, Michael B, MD  NEOMYCIN-POLYMYXIN-HYDROCORTISONE  (CORTISPORIN) 1 % SOLN OTIC solution Place 3 drops into the right ear 4 (four) times daily. Patient not taking: Reported on 07/31/2019 04/08/18   Benjiman CoreWiseman, Brittany D, PA-C  nystatin (MYCOSTATIN/NYSTOP) powder Apply topically 4 (four) times daily. Patient not taking: Reported on 07/31/2019 04/08/18   Benjiman CoreWiseman, Brittany D, PA-C  pantoprazole (PROTONIX) 40 MG tablet Take 1 tablet (40 mg total) by mouth daily. Take 30-60 min before first meal of the day Patient not taking: Reported on 07/31/2019 11/12/17   Sandrea HughsWert, Michael  B, MD  traMADol (ULTRAM) 50 MG tablet 1-2 every 4 hours as needed for cough or pain Patient not taking: Reported on 02/05/2018 11/12/17   Tanda Rockers, MD     Critical care time:      Georgann Housekeeper, AGACNP-BC [place] for personal pager PCCM on call pager [phone]  08/02/2019 9:13 PM

## 2019-08-02 NOTE — ED Notes (Signed)
Pt disconnected heparin from IV cath, floor wet with a considerable amount of heparin on the floor.  Restarted heparin. Attempted to redirect patient, though he is not willing to follow directions.

## 2019-08-02 NOTE — Progress Notes (Signed)
Pt combative. Pulled out IV again. Rapid response called and cardiology paged to bedside.

## 2019-08-02 NOTE — ED Notes (Signed)
Called for pt, no answer

## 2019-08-02 NOTE — ED Provider Notes (Signed)
[name] CARDIAC CATH LAB Provider Note   CSN: [id number] Arrival date & time: 08/02/19  0347     History   Chief Complaint Chief Complaint  Patient presents with  . Chest Pain    HPI Barry Gutierrez is a 58 y.o. male.     HPI Patient presents to the emergency department with crushing chest pain that is been ongoing for the last 4 days.  The patient was seen and left AMA yesterday.  Patient returns here with continued pain that he feels is worsening.  Patient states he does have exertional shortness of breath as well.  The patient is a 2 pack-a-day smoker.  Patient states that he did not take any medications other than his own medications prior to arrival.  The patient denies  headache,blurred vision, neck pain, fever, cough, weakness, numbness, dizziness, anorexia, edema, abdominal pain, nausea, vomiting, diarrhea, rash, back pain, dysuria, hematemesis, bloody stool, near syncope, or syncope. Past Medical History:  Diagnosis Date  . Allergy     Patient Active Problem List   Diagnosis Date Noted  . Essential hypertension   . Alcohol abuse   . NSTEMI (non-ST elevated myocardial infarction) (HCC) 07/31/2019  . Diverticulosis of colon 02/05/2018  . Upper airway cough syndrome 11/13/2017  . Cigarette smoker 11/13/2017  . Asthmatic bronchitis with acute exacerbation 11/12/2017    History reviewed. No pertinent surgical history.      Home Medications    Prior to Admission medications   Medication Sig Start Date End Date Taking? Authorizing Provider  acetaminophen (TYLENOL) 325 MG tablet Take 325-650 mg by mouth every 6 (six) hours as needed (for pain).    [provider]  clotrimazole (LOTRIMIN) 1 % cream Apply 1 application topically 2 (two) times daily. Patient not taking: Reported on 07/31/2019 04/08/18   Benjiman Core D, PA-C  famotidine (PEPCID) 20 MG tablet One at bedtime Patient taking differently: Take 20-80 mg by mouth 2 (two)  times daily as needed for heartburn or indigestion.  11/12/17   Nyoka Cowden, MD  NEOMYCIN-POLYMYXIN-HYDROCORTISONE (CORTISPORIN) 1 % SOLN OTIC solution Place 3 drops into the right ear 4 (four) times daily. Patient not taking: Reported on 07/31/2019 04/08/18   Benjiman Core D, PA-C  nystatin (MYCOSTATIN/NYSTOP) powder Apply topically 4 (four) times daily. Patient not taking: Reported on 07/31/2019 04/08/18   Benjiman Core D, PA-C  pantoprazole (PROTONIX) 40 MG tablet Take 1 tablet (40 mg total) by mouth daily. Take 30-60 min before first meal of the day Patient not taking: Reported on 07/31/2019 11/12/17   Nyoka Cowden, MD  traMADol Janean Sark) 50 MG tablet 1-2 every 4 hours as needed for cough or pain Patient not taking: Reported on 02/05/2018 11/12/17   Nyoka Cowden, MD    Family History Family History  Problem Relation Age of Onset  . Cancer Mother   . CAD Mother   . Coronary artery disease Mother   . Diabetes Father   . CAD Father   . Coronary artery disease Father     Social History Social History   Tobacco Use  . Smoking status: Current Every Day Smoker    Packs/day: 2.00    Types: Cigarettes  . Smokeless tobacco: Never Used  Substance Use Topics  . Alcohol use: Yes  . Drug use: Yes    Types: Marijuana    Comment: every other month or so      Allergies   Patient has no known allergies.  Review of Systems Review of Systems All other systems negative except as documented in the HPI. All pertinent positives and negatives as reviewed in the HPI. Physical Exam Updated Vital Signs BP (!) 165/108   Pulse (!) 113   Temp 98.6 F (37 C) (Oral)   Resp (!) 24   Ht  (1.778 m)   Wt 93.3 kg   SpO2 97%   BMI 29.51 kg/m   Physical Exam Vitals signs and nursing note reviewed.  Constitutional:      General: He is not in acute distress.    Appearance: He is well-developed.  HENT:     Head: Normocephalic and atraumatic.  Eyes:     Pupils: Pupils are  equal, round, and reactive to light.  Neck:     Musculoskeletal: Normal range of motion and neck supple.  Cardiovascular:     Rate and Rhythm: Normal rate and regular rhythm.     Heart sounds: Normal heart sounds. No murmur. No friction rub. No gallop.   Pulmonary:     Effort: Pulmonary effort is normal. No respiratory distress.     Breath sounds: Normal breath sounds. No decreased breath sounds, wheezing or rhonchi.  Abdominal:     General: Bowel sounds are normal. There is no distension.     Palpations: Abdomen is soft.     Tenderness: There is no abdominal tenderness.  Skin:    General: Skin is warm and dry.     Capillary Refill: Capillary refill takes less than 2 seconds.     Findings: No erythema or rash.  Neurological:     Mental Status: He is alert and oriented to person, place, and time.     Motor: No abnormal muscle tone.     Coordination: Coordination normal.  Psychiatric:        Behavior: Behavior normal.      ED Treatments / Results  Labs (all labs ordered are listed, but only abnormal results are displayed) Labs Reviewed  BASIC METABOLIC PANEL - Abnormal; Notable for the following components:      Result Value   Glucose, Bld 117 (*)    All other components within normal limits  APTT - Abnormal; Notable for the following components:   aPTT 121 (*)    All other components within normal limits  TROPONIN I (HIGH SENSITIVITY) - Abnormal; Notable for the following components:   Troponin I (High Sensitivity) 1,333 (*)    All other components within normal limits  TROPONIN I (HIGH SENSITIVITY) - Abnormal; Notable for the following components:   Troponin I (High Sensitivity) 2,329 (*)    All other components within normal limits  PROTIME-INR    EKG EKG Interpretation  Date/Time:  Monday August 02 2019 10:13:01 EST Ventricular Rate:  105 PR Interval:  154 QRS Duration: 100 QT Interval:  354 QTC Calculation: 468 R Axis:   72 Text Interpretation: Sinus  tachycardia Borderline ST depression, lateral leads worsening inferior changes Confirmed by Benjiman Core [phone]) on 08/02/2019 10:16:16 AM   Radiology Dg Chest 2 View  Result Date: 08/02/2019 CLINICAL DATA:  Chest pain. EXAM: CHEST - 2 VIEW COMPARISON:  07/31/2019. FINDINGS: Mediastinum and hilar structures are normal. Heart size normal. Mild bibasilar subsegmental atelectasis. Tiny calcified nodule left upper lung most consistent with tiny granuloma. No interim change from prior exam. No pleural effusion or pneumothorax. No acute bony abnormality identified. IMPRESSION: Mild bibasilar subsegmental atelectasis. Exam otherwise unremarkable. Electronically Signed   By: Maisie Fus  Register  On: 08/02/2019 06:16   Dg Chest 2 View  Result Date: 07/31/2019 CLINICAL DATA:  Chest pain EXAM: CHEST - 2 VIEW COMPARISON:  Chest radiograph 11/12/2017 FINDINGS: Normal cardiac and mediastinal contours. No consolidative pulmonary opacities. No pleural effusion or pneumothorax. Thoracic spine degenerative changes. IMPRESSION: No acute cardiopulmonary process. Electronically Signed   By: Annia Beltrew  Davis M.D.   On: 07/31/2019 21:08    Procedures Procedures (including critical care time)  Medications Ordered in ED Medications  nitroGLYCERIN 50 mg in dextrose 5 % 250 mL (0.2 mg/mL) infusion (0 mcg/min Intravenous Transfusing/Transfer 08/02/19 1604)  morphine 4 MG/ML injection 4 mg ( Intravenous MAR Hold 08/02/19 1607)  heparin ADULT infusion 100 units/mL (25000 units/25950mL sodium chloride 0.45%) (1,600 Units/hr Intravenous Transfusing/Transfer 08/02/19 1604)  pantoprazole (PROTONIX) EC tablet 40 mg ( Oral Automatically Held 08/10/19 1000)  aspirin chewable tablet 324 mg ( Oral MAR Hold 08/02/19 1607)    Or  aspirin suppository 300 mg ( Rectal MAR Hold 08/02/19 1607)  aspirin EC tablet 81 mg ( Oral Automatically Held 08/11/19 1000)  nitroGLYCERIN (NITROSTAT) SL tablet 0.4 mg ( Sublingual MAR Hold 08/02/19 1607)   acetaminophen (TYLENOL) tablet 650 mg ( Oral MAR Hold 08/02/19 1607)  ondansetron (ZOFRAN) injection 4 mg ( Intravenous MAR Hold 08/02/19 1607)  sodium chloride flush (NS) 0.9 % injection 3 mL ( Intravenous Automatically Held 08/10/19 2200)  sodium chloride flush (NS) 0.9 % injection 3 mL ( Intravenous MAR Hold 08/02/19 1607)  0.9 %  sodium chloride infusion ( Intravenous MAR Hold 08/02/19 1607)  carvedilol (COREG) tablet 6.25 mg ( Oral Automatically Held 08/10/19 1700)  atorvastatin (LIPITOR) tablet 80 mg ( Oral Automatically Held 08/10/19 1800)  zolpidem (AMBIEN) tablet 5 mg ( Oral MAR Hold 08/02/19 1608)  ALPRAZolam (XANAX) tablet 0.25 mg ( Oral MAR Hold 08/02/19 1608)  sodium chloride flush (NS) 0.9 % injection 3 mL ( Intravenous Automatically Held 08/10/19 2200)  fentaNYL (SUBLIMAZE) injection (25 mcg Intravenous Given 08/02/19 1622)  midazolam (VERSED) injection (2 mg Intravenous Given 08/02/19 1622)  Heparin (Porcine) in NaCl 1000-0.9 UT/500ML-% SOLN (500 mLs  Given 08/02/19 1622)  0.9 %  sodium chloride infusion (1 mL/kg/hr  93.3 kg Intravenous New Bag/Given 08/02/19 1624)  sodium chloride flush (NS) 0.9 % injection 3 mL (3 mLs Intravenous Given 08/02/19 0904)  aspirin chewable tablet 324 mg (324 mg Oral Given 08/02/19 0900)  heparin bolus via infusion 4,000 Units (4,000 Units Intravenous Bolus from Bag 08/02/19 0904)  LORazepam (ATIVAN) injection 1 mg (1 mg Intravenous Given 08/02/19 1013)  LORazepam (ATIVAN) tablet 1 mg (1 mg Oral Given 08/02/19 1335)  lidocaine (PF) (XYLOCAINE) 1 % injection (has no administration in time range)  Heparin (Porcine) in NaCl 1000-0.9 UT/500ML-% SOLN (has no administration in time range)  midazolam (VERSED) 2 MG/2ML injection (has no administration in time range)  fentaNYL (SUBLIMAZE) 100 MCG/2ML injection (has no administration in time range)     Initial Impression / Assessment and Plan / ED Course  I have reviewed the triage vital signs and the  nursing notes.  Pertinent labs & imaging results that were available during my care of the patient were reviewed by me and considered in my medical decision making (see chart for details).        She was started on heparin and nitro drip.  The patient has been stable thus far in the emergency department.  I spoke with cardiology who will come down and evaluate the patient for admission.  CRITICAL CARE Performed  by: Resa Miner Amia Rynders Total critical care time:45 minutes Critical care time was exclusive of separately billable procedures and treating other patients. Critical care was necessary to treat or prevent imminent or life-threatening deterioration. Critical care was time spent personally by me on the following activities: development of treatment plan with patient and/or surrogate as well as nursing, discussions with consultants, evaluation of patient's response to treatment, examination of patient, obtaining history from patient or surrogate, ordering and performing treatments and interventions, ordering and review of laboratory studies, ordering and review of radiographic studies, pulse oximetry and re-evaluation of patient's condition.;  Final Clinical Impressions(s) / ED Diagnoses   Final diagnoses:  NSTEMI (non-ST elevated myocardial infarction) [facility])    ED Discharge Orders    None       Dalia Heading, PA-C 08/04/19 1457    Davonna Belling, MD 08/04/19 2240

## 2019-08-02 NOTE — ED Notes (Signed)
Pt turned off IV infusion of Heparan, removed all monitoring equipment, stating he needed to go for a Cath immediately. Pt escorted back to bed by this RN.  IV heparin infusion resumed and patient back on monitor.

## 2019-08-02 NOTE — Plan of Care (Signed)
Continue to monitor

## 2019-08-02 NOTE — Progress Notes (Signed)
Patient jumped out of bed and went into the hall and stated he was leaving. Charge nurse talked to patient and he decided he wanted to stay. Very anxious and aggressive. Ativan 2 mg IV given. Patient back to bed at this time. Will continue to monitor.

## 2019-08-02 NOTE — Progress Notes (Signed)
Patient brought to the cath lab and placed on the table. I spoke with him and explained the procedure. He was agreeable to proceed. Shortly afterwards he became progressively agitated and combative. He could not lie still or flat. Appeared very agitated, lifting head and legs up and repeatedly trying to get up off the table.  He was given some sedation with IV versed 2 mg and fentanyl 25 micrograms but this did not result in any improvement. At this point it is not safe to proceed with cardiac cath given patient's inability to lie flat or follow direction. Would consider psychiatric evaluation. I would only consider invasive evaluation if patient can demonstrate ability to cooperate with care team to deliver appropriate care.   Peter Martinique MD, [facility]

## 2019-08-02 NOTE — Progress Notes (Signed)
Patient arrived to 4E room 04 at this time from the cath lab. Telemetry applied and CCMD notified. V/s and assessment complete. Patient asleep but easily aroused. States he wants "to be left alone and sleep". Will continue to monitor. Call bell within patient's reach.   Emelda Fear, RN

## 2019-08-02 NOTE — Progress Notes (Signed)
Brief Note:  Called to patient's bedside by charge nurse of the unit. Earlier patient evaluated by Rapid Response for significant agitation and disruptive behavior. Apparently he pulled out all of his IV lines and the tele wires. Yelling and cursing at staff. Security had to be called in.  Placed a consult with the Critical Care team and spoke to Dr Madalyn Rob. Patient will be transferred to the ICU for further care. He has already received Ativan and Haldol. Plan is to start him on Precedex infusion to help with his agitation.  Melina Schools, MD, [facility]

## 2019-08-02 NOTE — Progress Notes (Signed)
ANTICOAGULATION CONSULT NOTE - Initial Consult  Pharmacy Consult for heparin  Indication: chest pain/ACS  No Known Allergies  Patient Measurements: Height: 5\' 10"  (177.8 cm) Weight: 205 lb 11 oz (93.3 kg) IBW/kg (Calculated) : 73 Heparin Dosing Weight: 91.9 kg   Vital Signs: Temp: 98 F (36.7 C) (11/30 1707) Temp Source: Oral (11/30 1707) BP: 169/112 (11/30 1707) Pulse Rate: 103 (11/30 1707)  Labs: Recent Labs    07/31/19 2046 07/31/19 2227  08/01/19 0154 08/01/19 0712 08/01/19 1722 08/02/19 0222 08/02/19 0601 08/02/19 0755 08/02/19 0943  HGB 17.6*  --   --  16.8  --   --  15.8  --   --   --   HCT 50.5  --   --  46.7  --   --  44.1  --   --   --   PLT 178  --   --  157  --   --  150  --   --   --   APTT  --   --   --   --   --   --   --   --   --  121*  LABPROT  --  13.0  --   --   --   --   --   --   --  13.5  INR  --  1.0  --   --   --   --   --   --   --  1.0  HEPARINUNFRC  --   --   --   --  <0.10* 0.13* 0.15*  --   --   --   CREATININE 0.83  --   --  0.90  --   --  0.97 0.92  --   --   TROPONINIHS 791*  --    < > 1,065* 1,257*  --   --  1,333* 2,329*  --    < > = values in this interval not displayed.    Estimated Creatinine Clearance: 102.8 mL/min (by C-G formula based on SCr of 0.92 mg/dL).   Medical History: Past Medical History:  Diagnosis Date  . Allergy    Assessment: 58 yo male presented on 08/02/2019 after leaving AMA early this AM. Patient represents with worsening chest pain and SOB. Pharmacy consulted to dose heparin for ACS. Patient was previously subtherapeutic on heparin 1550 units/hr, but unclear if this if before or after he removed his IVs. No anticoagulation prior to admission.   Heparin infusion was stopped prior to patient going to cath lab for procedure. Pt was unable to have the procedure, due to agitation. After pt returned to 4E, he continued to be agitated. After pt rec'd lorazepam, RN was able to restart heparin infusion at 1600  units/hr ~1800 this evening.   Goal of Therapy:  Heparin level 0.3-0.7 units/ml Monitor platelets by anticoagulation protocol: Yes   Plan:  Restart heparin infusion at 1600 units/hr Check 6-hr heparin level Monitor daily heparin level, CBC Monitor for signs/symptoms of bleeding F/U plans for cardiac cath  Gillermina Hu, PharmD, BCPS, [facility] Clinical Pharmacist 08/02/2019,6:08 PM

## 2019-08-02 NOTE — ED Notes (Signed)
MD Alvino Chapel made aware that pt is progressively becoming more anxious and having what appears to be an anxiety attack. Repeat EKG done and ativan given.

## 2019-08-02 NOTE — H&P (Addendum)
Cardiology Consultation:   Patient ID: Barry Gutierrez MRN: [id number]; DOB: Nov 10, 1960  Admit date: 08/02/2019 Date of Consult: 08/02/2019  Primary Care Provider: Rutherford Guys, MD Primary Cardiologist: No primary care provider on file.  Primary Electrophysiologist:  None   Chief complaint: Chest pain  Patient Profile:   Barry Gutierrez is a 58 y.o. male with a no cardiac hx and tobacco use who was admitted over the weekend for CP/NSTEMI but left AMA last night and now returns to the ED for chest pain at the request of Dr. Alvino Chapel.  History of Present Illness:   Barry Gutierrez was admitted over the weekend for NSTEMI. He presented with chest discomfort described as a crushing chest pain, worse with exertion, resolved with rest. Famotidine and BC powder did not help the pain. The pain was intermittent at first but once it became persistent he went into the ED. He reported associated dyspnea. Denied edema. Denied palpitations, syncope. Troponin peaked to 1200. He was started on IV heparin and required IV NTG for persistent mild chest pain. He was started on BB, statin, ASA.  He was started on CIWA protocol.The patient was scheduled for cardiac cath today but left AMA last night.   He returns to the ED today because his chest pain is worse. It is substernal and 10/10 with associated SOB. He is also feeling very anxious. No N/V, diaphoresis. HS troponin today 1,333 > 2,329. Labs show potassium 4.7, glucose 117, creatinine 0.92. AST 42. WBC 10.1, Hgb 15.8. EKG shows minimal ST depression inferior leads (seen on previous EKG). Patient was started on heparin and IV NTG and cardiology was consulted for admission.   The patient is a long-term smoker of 45 years (with possible COPD), He drinks 2 shots daily. Denies drug use. He lives alone. He works as a Soil scientist.   Heart Pathway Score:     Past Medical History:  Diagnosis Date  . Allergy     History reviewed. No pertinent  surgical history.   Home Medications:  Prior to Admission medications   Medication Sig Start Date End Date Taking? Authorizing Provider  acetaminophen (TYLENOL) 325 MG tablet Take 325-650 mg by mouth every 6 (six) hours as needed (for pain).    [provider]  clotrimazole (LOTRIMIN) 1 % cream Apply 1 application topically 2 (two) times daily. Patient not taking: Reported on 07/31/2019 04/08/18   Tenna Delaine D, PA-C  famotidine (PEPCID) 20 MG tablet One at bedtime Patient taking differently: Take 20-80 mg by mouth 2 (two) times daily as needed for heartburn or indigestion.  11/12/17   Tanda Rockers, MD  NEOMYCIN-POLYMYXIN-HYDROCORTISONE (CORTISPORIN) 1 % SOLN OTIC solution Place 3 drops into the right ear 4 (four) times daily. Patient not taking: Reported on 07/31/2019 04/08/18   Tenna Delaine D, PA-C  nystatin (MYCOSTATIN/NYSTOP) powder Apply topically 4 (four) times daily. Patient not taking: Reported on 07/31/2019 04/08/18   Tenna Delaine D, PA-C  pantoprazole (PROTONIX) 40 MG tablet Take 1 tablet (40 mg total) by mouth daily. Take 30-60 min before first meal of the day Patient not taking: Reported on 07/31/2019 11/12/17   Tanda Rockers, MD  traMADol Veatrice Bourbon) 50 MG tablet 1-2 every 4 hours as needed for cough or pain Patient not taking: Reported on 02/05/2018 11/12/17   Tanda Rockers, MD    Inpatient Medications: Scheduled Meds: .  morphine injection  4 mg Intravenous Once   Continuous Infusions: . heparin 1,600 Units/hr (08/02/19 0904)  .  nitroGLYCERIN 5 mcg/min (08/02/19 0902)   PRN Meds:   Allergies:   No Known Allergies  Social History:   Social History   Socioeconomic History  . Marital status: Legally Separated    Spouse name: Not on file  . Number of children: Not on file  . Years of education: Not on file  . Highest education level: Not on file  Occupational History  . Not on file  Social Needs  . Financial resource strain: Not on file  .  Food insecurity    Worry: Not on file    Inability: Not on file  . Transportation needs    Medical: No    Non-medical: No  Tobacco Use  . Smoking status: Current Every Day Smoker    Packs/day: 2.00    Types: Cigarettes  . Smokeless tobacco: Never Used  Substance and Sexual Activity  . Alcohol use: Yes  . Drug use: Yes    Types: Marijuana    Comment: every other month or so   . Sexual activity: Not Currently  Lifestyle  . Physical activity    Days per week: Not on file    Minutes per session: Not on file  . Stress: Not on file  Relationships  . Social Musicianconnections    Talks on phone: Not on file    Gets together: Not on file    Attends religious service: Not on file    Active member of club or organization: Not on file    Attends meetings of clubs or organizations: Not on file    Relationship status: Not on file  . Intimate partner violence    Fear of current or ex partner: Not on file    Emotionally abused: Not on file    Physically abused: Not on file    Forced sexual activity: Not on file  Other Topics Concern  . Not on file  Social History Narrative  . Not on file    Family History:   Family History  Problem Relation Age of Onset  . Cancer Mother   . CAD Mother   . Coronary artery disease Mother   . Diabetes Father   . CAD Father   . Coronary artery disease Father      ROS:  Please see the history of present illness.  All other ROS reviewed and negative.     Physical Exam/Data:   Vitals:   08/02/19 0930 08/02/19 1015 08/02/19 1030 08/02/19 1045  BP: (!) 160/93 (!) 158/96 (!) 157/97 (!) 159/91  Pulse: 95 97  90  Resp: 19 17 18 17   Temp:      TempSrc:      SpO2: 96% 97%  99%  Weight:      Height:        Intake/Output Summary (Last 24 hours) at 08/02/2019 1226 Last data filed at 08/02/2019 1130 Gross per 24 hour  Intake -  Output 300 ml  Net -300 ml   Last 3 Weights 08/02/2019 08/02/2019 08/01/2019  Weight (lbs) 205 lb 11 oz 205 lb 11 oz 205  lb 11 oz  Weight (kg) 93.3 kg 93.3 kg 93.3 kg     Body mass index is 29.51 kg/m.  General:  Well nourished, well developed, in no acute distress HEENT: normal Lymph: no adenopathy Neck: no JVD Endocrine:  No thryomegaly Vascular: No carotid bruits; FA pulses 2+ bilaterally without bruits  Cardiac:  normal S1, S2; RRR; no murmur  Lungs:  clear to auscultation bilaterally, no  wheezing, rhonchi or rales  Abd: soft, nontender, no hepatomegaly  Ext: no edema Musculoskeletal:  No deformities, BUE and BLE strength normal and equal Skin: warm and dry  Neuro:  CNs 2-12 intact, no focal abnormalities noted Psych:  Normal affect   EKG:  The EKG was personally reviewed and demonstrates:  NSR, 95 bpm, TWI III, minimal ST depression inferior leads, Qt   Relevant CV Studies:  Echo 08/01/19  1. Left ventricular ejection fraction, by visual estimation, is 60 to 65%. The left ventricle has normal function. There is mildly increased left ventricular hypertrophy. Probable mild hypokinesis of the basal to mid inferior wall.  2. Definity contrast agent was given IV to delineate the left ventricular endocardial borders.  3. Left ventricular diastolic parameters are consistent with Grade I diastolic dysfunction (impaired relaxation).  4. Global right ventricle has normal systolic function.The right ventricular size is normal. No increase in right ventricular wall thickness.  5. Left atrial size was normal.  6. Right atrial size was normal.  7. The mitral valve is normal in structure. No evidence of mitral valve regurgitation.  8. The tricuspid valve is normal in structure. Tricuspid valve regurgitation is trivial.  9. The aortic valve is normal in structure. Aortic valve regurgitation is not visualized. Mild aortic valve sclerosis without stenosis. 10. The pulmonic valve was grossly normal. Pulmonic valve regurgitation is trivial.   Laboratory Data:  High Sensitivity Troponin:   Recent Labs   Lab 07/31/19 2240 08/01/19 0154 08/01/19 0712 08/02/19 0601 08/02/19 0755  TROPONINIHS 728* 1,065* 1,257* 1,333* 2,329*     Chemistry Recent Labs  Lab 08/01/19 0154 08/02/19 0222 08/02/19 0601  NA 141 136 137  K 3.8 3.5 4.7  CL 106 102 101  CO2 GLUCOSE 116* 116* 117*  BUN CREATININE 0.90 0.97 0.92  CALCIUM 9.0 8.9 9.5  GFRNONAA >60 >60 >60  GFRAA >60 >60 >60  ANIONGAP Recent Labs  Lab 07/31/19 2240 08/02/19 0222  PROT 6.7 6.3*  ALBUMIN 3.9 3.6  AST 46* 42*  ALT 39 34  ALKPHOS 61 54  BILITOT 1.4* 1.5*   Hematology Recent Labs  Lab 07/31/19 2046 08/01/19 0154 08/02/19 0222  WBC 8.9 8.2 10.1  RBC 5.25 4.95 4.67  HGB 17.6* 16.8 15.8  HCT 50.5 46.7 44.1  MCV 96.2 94.3 94.4  MCH 33.5 33.9 33.8  MCHC 34.9 36.0 35.8  RDW 12.4 12.4 12.5  PLT 178 157 150   BNP Recent Labs  Lab 07/31/19 2240  BNP 313.9*    DDimer No results for input(s): DDIMER in the last 168 hours.   Radiology/Studies:  Dg Chest 2 View  Result Date: 08/02/2019 CLINICAL DATA:  Chest pain. EXAM: CHEST - 2 VIEW COMPARISON:  07/31/2019. FINDINGS: Mediastinum and hilar structures are normal. Heart size normal. Mild bibasilar subsegmental atelectasis. Tiny calcified nodule left upper lung most consistent with tiny granuloma. No interim change from prior exam. No pleural effusion or pneumothorax. No acute bony abnormality identified. IMPRESSION: Mild bibasilar subsegmental atelectasis. Exam otherwise unremarkable. Electronically Signed   By: Maisie Fus  Register   On: 08/02/2019 06:16   Dg Chest 2 View  Result Date: 07/31/2019 CLINICAL DATA:  Chest pain EXAM: CHEST - 2 VIEW COMPARISON:  Chest radiograph 11/12/2017 FINDINGS: Normal cardiac and mediastinal contours. No consolidative pulmonary opacities. No pleural effusion or pneumothorax. Thoracic spine degenerative changes. IMPRESSION: No acute cardiopulmonary process. Electronically Signed   By:  Annia Belt M.D.   On:  07/31/2019 21:08    Assessment and Plan:   UA/NSTEMI Patient was admitted over the weekend for chest pain with plan for cath but left AMA. He returns to the ED for worsening chest pain 10/10, minimal relief with IV NTG.  - HS troponin is up to 2,000 - EKG with minimal ST depression inferior leads - IV heparin per pharmacy - Plan for cardiac cath today - creatinine and Hgb stable - restart statin, BB, aspirin - continue IV NTG - further recommendations per cath Risks and benefits of cardiac catheterization have been discussed with the patient.  These include bleeding, infection, kidney damage, stroke, heart attack, death.  The patient understands these risks and is willing to proceed.  HLD - LDL 128 - patient was started in Lipitor. AST mildly elevated. Will need follow up labs outpatient  HTN - was elevated over the weekend and started on coreg>>restart  ETOH/toabcco use - CIWA protocol - nicotine patch and gum - Plans to quit smoking  For questions or updates, please contact [facility] Please consult [email or web address] for contact info under     Signed, Cadence Yoshiaki Stall, PA-C  08/02/2019 12:26 PM   ---------------------------------------------------------------------------------------------   History and all data above reviewed.  Patient examined.  I agree with the findings as above.  Barry Gutierrez is having active chest pain.   Constitutional: No acute distress Eyes: pupils equally round and reactive to light, sclera non-icteric, normal conjunctiva and lids ENMT: normal dentition, moist mucous membranes Cardiovascular: regular rhythm, normal rate, no murmurs. S1 and S2 normal. Radial pulses normal bilaterally. No jugular venous distention.  Respiratory: clear to auscultation bilaterally GI : normal bowel sounds, soft and nontender. No distention.   MSK: extremities warm, well perfused. No edema.  NEURO: grossly nonfocal exam, moves all extremities. PSYCH: alert  and oriented x 3, normal mood and affect.   All available labs, radiology testing, previous records reviewed. Agree with documented assessment and plan of my colleague as stated above with the following additions or changes:  Active Problems:   NSTEMI (non-ST elevated myocardial infarction) [facility])    Plan:  Plan for coronary angiography. Consent reviewed. Chest pain continues, on IV nitroglycerin.    Echo with preserved EF and mild hypo of basal to mid inferior wall, echo yesterday.   Plan for CIWA protocol.  Length of Stay:  LOS: 0 days   Parke Poisson, MD [facility] 2:39 PM  08/02/2019

## 2019-08-02 NOTE — ED Notes (Signed)
Pt walked out into hallway stating he could not breathe. Pt helped back into room and placed back onto monitor. Pt had o2 sat's 95% and was placed on 2 L [place]. Pt seems very anxious- Pt more relaxed on oxygen and sitting up in bed. EPD has consulted cardiology.

## 2019-08-02 NOTE — ED Notes (Addendum)
Cath Lab called to inquire about if this patient is on schedule they informed me this patient is waiting on a consult with cardiology in order to be put back on schedule for cath lab. States they will call the coordinator to bump him up in line for patients waiting for cards consult. EPD made aware- pt continues to have chest pain and be restless.  Called for the primary nurse Latricia Heft

## 2019-08-02 NOTE — Significant Event (Signed)
Rapid Response Event Note  Overview: Acute Delerium in setting of CP/ACS  Initial Focused Assessment: Call received from [facility] regarding pt in DTs with CIWA >20, combative with security at bedside. Reportedly, Ativan 4mg  IV already given over last hour (2mg , 2mg ). Upon arrival, pt is confused, wanting to leaving sitting on the end of the bed cursing staff and saying he was gonna "kill [place]". Pt had pulled out at least 2 PIVs and had no current IV access. Primary service notified and Dr. Jeannette Corpus came to bedside. Haldol 5mg  IM ordered and administered. PCCM consulted for Precedex infusion and transfer to ICU. Georgann Housekeeper ACNP came to bedside. Precedex infusion initiated and infusion titrated to achieve sedation goal.   Interventions: -Haldol 5mg  IM -PIV -Precedex gtt at 0.32mcg/kg/ming -Tx ICU    Event Summary: Pt tx to [facility] Call received 1959 Arrived at call 2003 Call ended 2215  Madelynn Done

## 2019-08-02 NOTE — Interval H&P Note (Signed)
History and Physical Interval Note:  08/02/2019 4:19 PM  Barry Gutierrez  has presented today for surgery, with the diagnosis of chest pain.  The various methods of treatment have been discussed with the patient and family. After consideration of risks, benefits and other options for treatment, the patient has consented to  Procedure(s): LEFT HEART CATH AND CORONARY ANGIOGRAPHY (N/A) as a surgical intervention.  The patient's history has been reviewed, patient examined, no change in status, stable for surgery.  I have reviewed the patient's chart and labs.  Questions were answered to the patient's satisfaction.    Cath Lab Visit (complete for each Cath Lab visit)  Clinical Evaluation Leading to the Procedure:   ACS: Yes.    Non-ACS:    Anginal Classification: CCS IV  Anti-ischemic medical therapy: No Therapy  Non-Invasive Test Results: No non-invasive testing performed  Prior CABG: No previous CABG       Collier Salina [facility] 08/02/2019 4:19 PM

## 2019-08-02 NOTE — Progress Notes (Signed)
Pt becoming agitated and restless.  Wondering why taking so long to "get him done".  Try to explain to pt it was 0238 in morning  but there was no reasoning and pt refused to listen.  Pt called 911 said we were "lying to him and trying to keep him here".  Wanted me to call Dr.  Bryan Lemma to pt that I am paging Dr. (Paged Dr.asked to come see pt).     Pt pulling at lines. Tried to get pt to sit and wait on Dr. However pt proceeded to pull out Iv line.  Pt standing and wobbling around in room nurse tech at pt's side. Pt becoming increasingly aggressive and threating nurse tech.  Both [employer], Security, and [facility] was called to Bedside.  PT was cursing at both staff,  Security and Police.  Pt would not listen, could not educate. Pt stated " I've been here over 3 days and I'm going somewhere else"    ADA papers at bedside. Pt refused to sign.  Pt remove all Iv lines .  Police and Security both escorted pt out.   Re-paged Dr to inform that pt left AMA.

## 2019-08-02 NOTE — ED Notes (Signed)
Pt reports heavy ETOH use - 5 bourbon drinks/day. No DT's/withdrawal symptoms witnessed in triage, but last drink 2 days ago.

## 2019-08-02 NOTE — Progress Notes (Addendum)
Patient agitated and combative. 2 mg IV ativan given. Patient then continued to get agitated and trying to leave. Patient ripped out IV and telemetry. MD paged and notified at this time.  Security called.

## 2019-08-02 NOTE — Progress Notes (Signed)
Patient's sister called and stated that patient is a "heavy drinker". Possible DT's? MD notified via [facility] page.   Patient sleeping at this time. Will continue to monitor

## 2019-08-03 ENCOUNTER — Other Ambulatory Visit: Payer: Self-pay

## 2019-08-03 DIAGNOSIS — F10230 Alcohol dependence with withdrawal, uncomplicated: Secondary | ICD-10-CM

## 2019-08-03 DIAGNOSIS — I214 Non-ST elevation (NSTEMI) myocardial infarction: Principal | ICD-10-CM

## 2019-08-03 DIAGNOSIS — I200 Unstable angina: Secondary | ICD-10-CM | POA: Diagnosis present

## 2019-08-03 LAB — CBC
HCT: 43.5 % (ref 39.0–52.0)
Hemoglobin: 15.1 g/dL (ref 13.0–17.0)
MCH: 33.8 pg (ref 26.0–34.0)
MCHC: 34.7 g/dL (ref 30.0–36.0)
MCV: 97.3 fL (ref 80.0–100.0)
Platelets: 137 10*3/uL — ABNORMAL LOW (ref 150–400)
RBC: 4.47 MIL/uL (ref 4.22–5.81)
RDW: 12.6 % (ref 11.5–15.5)
WBC: 11.2 10*3/uL — ABNORMAL HIGH (ref 4.0–10.5)
nRBC: 0 % (ref 0.0–0.2)

## 2019-08-03 LAB — BASIC METABOLIC PANEL
Anion gap: 10 (ref 5–15)
BUN: 9 mg/dL (ref 6–20)
CO2: 23 mmol/L (ref 22–32)
Calcium: 9.1 mg/dL (ref 8.9–10.3)
Chloride: 104 mmol/L (ref 98–111)
Creatinine, Ser: 1.1 mg/dL (ref 0.61–1.24)
GFR calc Af Amer: >60 mL/min (ref >60)
GFR calc non Af Amer: >60 mL/min (ref >60)
Glucose, Bld: 113 mg/dL — ABNORMAL HIGH (ref 70–99)
Potassium: 3.9 mmol/L (ref 3.5–5.1)
Sodium: 137 mmol/L (ref 135–145)

## 2019-08-03 LAB — HEPARIN LEVEL (UNFRACTIONATED)
Heparin Unfractionated: 0.18 IU/mL — ABNORMAL LOW (ref 0.30–0.70)
Heparin Unfractionated: 0.49 IU/mL (ref 0.30–0.70)
Heparin Unfractionated: 0.27 IU/mL — ABNORMAL LOW (ref 0.30–0.70)

## 2019-08-03 MED ORDER — ORAL CARE MOUTH RINSE
15.0000 mL | Freq: Two times a day (BID) | OROMUCOSAL | Status: DC
  Administered 2019-08-03 (×2): 15 mL via OROMUCOSAL

## 2019-08-03 MED ORDER — SODIUM CHLORIDE 0.9 % IV BOLUS
1000.0000 mL | Freq: Once | INTRAVENOUS | Status: AC
  Administered 2019-08-03: 1000 mL via INTRAVENOUS

## 2019-08-03 MED ORDER — CHLORDIAZEPOXIDE HCL 5 MG PO CAPS
5.0000 mg | ORAL_CAPSULE | Freq: Three times a day (TID) | ORAL | Status: DC
  Administered 2019-08-03 – 2019-08-04 (×3): 5 mg via ORAL
  Filled 2019-08-03 (×3): qty 1

## 2019-08-03 MED ORDER — HEPARIN BOLUS VIA INFUSION
3000.0000 [IU] | Freq: Once | INTRAVENOUS | Status: AC
  Administered 2019-08-03: 3000 [IU] via INTRAVENOUS
  Filled 2019-08-03: qty 3000

## 2019-08-03 MED FILL — Lidocaine HCl Local Preservative Free (PF) Inj 1%: INTRAMUSCULAR | Qty: 30 | Status: AC

## 2019-08-03 NOTE — Progress Notes (Signed)
Progress Note  Patient Name: Barry Gutierrez Date of Encounter: 08/03/2019  Primary Cardiologist: No primary care provider on file.   Subjective   Patient sedated at the time of my exam.   Inpatient Medications    Scheduled Meds: . aspirin  324 mg Oral NOW   Or  . aspirin  300 mg Rectal NOW  . aspirin EC  81 mg Oral Daily  . atorvastatin  80 mg Oral q1800  . carvedilol  6.25 mg Oral BID WC  . Chlorhexidine Gluconate Cloth  6 each Topical Daily  . mouth rinse  15 mL Mouth Rinse BID  .  morphine injection  4 mg Intravenous Once  . pantoprazole  40 mg Oral Daily  . sodium chloride flush  3 mL Intravenous Q12H  . thiamine  100 mg Oral Daily   Or  . thiamine  100 mg Intravenous Daily   Continuous Infusions: . sodium chloride    . dexmedetomidine 0.4 mcg/kg/hr (08/03/19 0600)  . heparin 1,900 Units/hr (08/03/19 0602)  . nitroGLYCERIN 3 mcg/min (08/03/19 0600)   PRN Meds: sodium chloride, acetaminophen, ALPRAZolam, LORazepam, nitroGLYCERIN, ondansetron (ZOFRAN) IV, sodium chloride flush, zolpidem   Vital Signs    Vitals:   08/03/19 0630 08/03/19 0700 08/03/19 0743 08/03/19 0747  BP:  (!) 88/58 94/62 (!) 84/51  Pulse: 79 80 85 81  Resp: (!) 23 (!) 23 (!) 25 (!) 28  Temp:      TempSrc:      SpO2: 96% 96% 95% 98%  Weight:      Height:        Intake/Output Summary (Last 24 hours) at 08/03/2019 0825 Last data filed at 08/03/2019 0600 Gross per 24 hour  Intake 1593.53 ml  Output 300 ml  Net 1293.53 ml   Last 3 Weights 08/03/2019 08/02/2019 08/02/2019  Weight (lbs) 200 lb 9.9 oz 205 lb 11 oz 205 lb 11 oz  Weight (kg) 91 kg 93.3 kg 93.3 kg      Telemetry    SR - Personally Reviewed  ECG    SR, possible inferior infarct - Personally Reviewed  Physical Exam   GEN: sedated Neck: No JVD Cardiac: RRR, no murmurs, rubs, or gallops.  Respiratory: Clear to auscultation bilaterally. GI: Soft, nontender, non-distended  MS: No edema; No deformity. Neuro:   sedated on precedex, cannot assess Psych: sedated on precedex, cannot assess  Labs    High Sensitivity Troponin:   Recent Labs  Lab 07/31/19 2240 08/01/19 0154 08/01/19 0712 08/02/19 0601 08/02/19 0755  TROPONINIHS 728* 1,065* 1,257* 1,333* 2,329*      Chemistry Recent Labs  Lab 07/31/19 2240  08/02/19 0222 08/02/19 0601 08/03/19 0442  NA  --    < > 136 137 137  K  --    < > 3.5 4.7 3.9  CL  --    < > 102 101 104  CO2  --    < > 22 25 23   GLUCOSE  --    < > 116* 117* 113*  BUN  --    < > 9 10 9   CREATININE  --    < > 0.97 0.92 1.10  CALCIUM  --    < > 8.9 9.5 9.1  PROT 6.7  --  6.3*  --   --   ALBUMIN 3.9  --  3.6  --   --   AST 46*  --  42*  --   --   ALT 39  --  34  --   --   ALKPHOS 61  --  54  --   --   BILITOT 1.4*  --  1.5*  --   --   GFRNONAA  --    < > >60 >60 >60  GFRAA  --    < > >60 >60 >60  ANIONGAP  --    < > 12 11 10    < > = values in this interval not displayed.     Hematology Recent Labs  Lab 08/01/19 0154 08/02/19 0222 08/03/19 0442  WBC 8.2 10.1 11.2*  RBC 4.95 4.67 4.47  HGB 16.8 15.8 15.1  HCT 46.7 44.1 43.5  MCV 94.3 94.4 97.3  MCH 33.9 33.8 33.8  MCHC 36.0 35.8 34.7  RDW 12.4 12.5 12.6  PLT 157 150 137*    BNP Recent Labs  Lab 07/31/19 2240  BNP 313.9*     DDimer No results for input(s): DDIMER in the last 168 hours.   Radiology    Dg Chest 2 View  Result Date: 08/02/2019 CLINICAL DATA:  Chest pain. EXAM: CHEST - 2 VIEW COMPARISON:  07/31/2019. FINDINGS: Mediastinum and hilar structures are normal. Heart size normal. Mild bibasilar subsegmental atelectasis. Tiny calcified nodule left upper lung most consistent with tiny granuloma. No interim change from prior exam. No pleural effusion or pneumothorax. No acute bony abnormality identified. IMPRESSION: Mild bibasilar subsegmental atelectasis. Exam otherwise unremarkable. Electronically Signed   By: Maisie Fushomas  Register   On: 08/02/2019 06:16    Cardiac Studies   Relevant  CV Studies:  Echo 08/01/19 1. Left ventricular ejection fraction, by visual estimation, is 60 to 65%. The left ventricle has normal function. There is mildly increased left ventricular hypertrophy. Probable mild hypokinesis of the basal to mid inferior wall. 2. Definity contrast agent was given IV to delineate the left ventricular endocardial borders. 3. Left ventricular diastolic parameters are consistent with Grade I diastolic dysfunction (impaired relaxation). 4. Global right ventricle has normal systolic function.The right ventricular size is normal. No increase in right ventricular wall thickness. 5. Left atrial size was normal. 6. Right atrial size was normal. 7. The mitral valve is normal in structure. No evidence of mitral valve regurgitation. 8. The tricuspid valve is normal in structure. Tricuspid valve regurgitation is trivial. 9. The aortic valve is normal in structure. Aortic valve regurgitation is not visualized. Mild aortic valve sclerosis without stenosis. 10. The pulmonic valve was grossly normal. Pulmonic valve regurgitation is trivial.  Patient Profile     Gigi GinDavid L Joyce is a 58 y.o. male with a no cardiac hx and tobacco use who was admitted over the weekend for CP/NSTEMI but left AMA and returned to the ED for chest pain on 08/02/2019, at the request of Dr. Rubin PayorPickering.  Assessment & Plan    Active Problems:   NSTEMI (non-ST elevated myocardial infarction) (HCC)   Unstable angina (HCC)  Very challenging situation given the patient's agitation and probable alcohol withdrawal.  Fortunately his ejection fraction is normal, however this was obtained prior to him leaving AMA and returning with significant chest pain.  We may need to attempt to wean the Precedex and monitor for agitation.  If we are able to get the patient in a state where he can remain calm on the catheterization table, we should consider coronary catheterization later this week.  I appreciate the  guidance of our colleagues in critical care on the timing of Precedex weaning.  We should continue aspirin 81 mg daily,  atorvastatin 80 mg daily, carvedilol 6.25 mg twice daily.  Consider ACE/ARB when off of Precedex if blood pressure will tolerate.  For questions or updates, please contact [place] Please consult [email or web address] for contact info under        Signed, Elouise Munroe, MD  08/03/2019, 8:25 AM

## 2019-08-03 NOTE — Progress Notes (Signed)
ANTICOAGULATION CONSULT NOTE - Initial Consult  Pharmacy Consult for heparin  Indication: chest pain/ACS  No Known Allergies  Patient Measurements: Height: 5\' 10"  (177.8 cm) Weight: 200 lb 9.9 oz (91 kg) IBW/kg (Calculated) : 73 Heparin Dosing Weight: 91.9 kg   Vital Signs: Temp: 101.2 F (38.4 C) (12/01 1500) Temp Source: Oral (12/01 1500) BP: 101/63 (12/01 1830) Pulse Rate: 82 (12/01 1830)  Labs: Recent Labs    07/31/19 2227  08/01/19 0154 08/01/19 8341  08/02/19 0222 08/02/19 0601 08/02/19 0755 08/02/19 0943 08/03/19 0442 08/03/19 1201 08/03/19 1845  HGB  --   --  16.8  --   --  15.8  --   --   --  15.1  --   --   HCT  --   --  46.7  --   --  44.1  --   --   --  43.5  --   --   PLT  --   --  157  --   --  150  --   --   --  137*  --   --   APTT  --   --   --   --   --   --   --   --  121*  --   --   --   LABPROT 13.0  --   --   --   --   --   --   --  13.5  --   --   --   INR 1.0  --   --   --   --   --   --   --  1.0  --   --   --   HEPARINUNFRC  --   --   --  <0.10*   < > 0.15*  --   --   --  0.18* 0.49 0.27*  CREATININE  --   --  0.90  --   --  0.97 0.92  --   --  1.10  --   --   TROPONINIHS  --    < > 1,065* 1,257*  --   --  1,333* 2,329*  --   --   --   --    < > = values in this interval not displayed.    Estimated Creatinine Clearance: 85.1 mL/min (by C-G formula based on SCr of 1.1 mg/dL).  Assessment: 58 yo male presented on 08/02/2019 after leaving AMA early this AM with worsening chest pain and SOB. Pharmacy consulted to dose heparin for ACS.   11/30  Cath cancelled due to agitation. Heparin was held pre-cath and restarted after but then disconnected at some point when patient pulled line out. Heparin restarted ~2am, no issues since then per RN.   Hep lvl now slightly low at 0.27  Goal of Therapy:  Heparin level 0.3-0.7 units/ml Monitor platelets by anticoagulation protocol: Yes   Plan:  Increase heparin to 2000 units/hr 0200 HL   Barth Kirks,  PharmD, BCPS, BCCCP Clinical Pharmacist [phone]  Please check AMION for all [place] numbers  08/03/2019 8:08 PM

## 2019-08-03 NOTE — Progress Notes (Signed)
ANTICOAGULATION CONSULT NOTE - Initial Consult  Pharmacy Consult for heparin  Indication: chest pain/ACS  No Known Allergies  Patient Measurements: Height: 5\' 10"  (177.8 cm) Weight: 200 lb 9.9 oz (91 kg) IBW/kg (Calculated) : 73 Heparin Dosing Weight: 91.9 kg   Vital Signs: Temp: 100.8 F (38.2 C) (12/01 0943) Temp Source: Axillary (12/01 0943) BP: 108/63 (12/01 1330) Pulse Rate: 76 (12/01 1330)  Labs: Recent Labs    07/31/19 2227  08/01/19 0154 08/01/19 1607  08/02/19 0222 08/02/19 0601 08/02/19 0755 08/02/19 0943 08/03/19 0442 08/03/19 1201  HGB  --   --  16.8  --   --  15.8  --   --   --  15.1  --   HCT  --   --  46.7  --   --  44.1  --   --   --  43.5  --   PLT  --   --  157  --   --  150  --   --   --  137*  --   APTT  --   --   --   --   --   --   --   --  121*  --   --   LABPROT 13.0  --   --   --   --   --   --   --  13.5  --   --   INR 1.0  --   --   --   --   --   --   --  1.0  --   --   HEPARINUNFRC  --   --   --  <0.10*   < > 0.15*  --   --   --  0.18* 0.49  CREATININE  --   --  0.90  --   --  0.97 0.92  --   --  1.10  --   TROPONINIHS  --    < > 1,065* 1,257*  --   --  1,333* 2,329*  --   --   --    < > = values in this interval not displayed.    Estimated Creatinine Clearance: 85.1 mL/min (by C-G formula based on SCr of 1.1 mg/dL).  Assessment: 58 yo male presented on 08/02/2019 after leaving AMA early this AM with worsening chest pain and SOB. Pharmacy consulted to dose heparin for ACS.   11/30  Cath cancelled due to agitation. Heparin was held pre-cath and restarted after but then disconnected at some point when patient pulled line out. Heparin restarted ~2am, no issues since then per RN.   HL therapeutic on 1900 units/hr. H/H & plt slight downtrend   Goal of Therapy:  Heparin level 0.3-0.7 units/ml Monitor platelets by anticoagulation protocol: Yes   Plan:  Continue heparin 1900 units/hr  Confirmatory 6-hr heparin level Monitor daily  heparin level, CBC Monitor for signs/symptoms of bleeding   Benetta Spar, PharmD, BCPS, BCCP Clinical Pharmacist  Please check AMION for all [place] phone numbers After 10:00 PM, call [place]

## 2019-08-03 NOTE — Progress Notes (Addendum)
NAME:  Barry Gutierrez, MRN:  [id number], DOB:  [birth year], LOS: 1 ADMISSION DATE:  08/02/2019, CONSULTATION DATE:  11/30 REFERRING MD:  Dr. Margaretann Loveless (cardiology), CHIEF COMPLAINT:  ICU   Brief History   58 year old male with ETOH history admitted for NSTEMI. Unable to undergo LHC due to withdrawal symptoms. Precedex infusion started and transfer to ICU  History of present illness   58 year old male with PMH as below, which is significant for ETOH, admitted on 11/28 for chest pain x several days and ruled in for NSTEMI with troponin peaking at 1200. He was treated with IV heparin and nitroglycerine, and was scheduled for Cath on 11/30. However, he left AMA, in the early AM hours of 11/30. He then presented again to the ED around 0430 AM that same day when chest pain became worse. He was re-admitted by the cardiology service and taken for cardiac cath, however, he became combative and agitated. The procedure was unable to be completed. He was treated with CIWA protocol for presumed alcohol withdrawal. Around 2000 on 11/30 he became combative ripping out IV lines. He was given 4mg  ativan and 5mg  haldol prior to calming down. He was then started on precedex and was transferred to ICU.  Past Medical History   has a past medical history of Allergy.   Significant Hospital Events   11/28 admit for STEMI 11/30 left AMA, came back, went for cath but unable due to combativeness. Precedex to ICU.   Consults:    Procedures:    Significant Diagnostic Tests:    Micro Data:    Antimicrobials:     Interim history/subjective:  No acute events. Resting comfortably.  On 0.4 precedex mcg/kg/hr  Objective   Blood pressure (!) 88/58, pulse 80, temperature 99 F (37.2 C), temperature source Oral, resp. rate (!) 23, height 5\' 10"  (1.778 m), weight 91 kg, SpO2 96 %.    FiO2 (%):  [28 %] 28 %   Intake/Output Summary (Last 24 hours) at 08/03/2019 0744 Last data filed at 08/03/2019 0600 Gross per 24  hour  Intake 1593.53 ml  Output 300 ml  Net 1293.53 ml   Filed Weights   08/02/19 0553 08/02/19 0830 08/03/19 0446  Weight: 93.3 kg 93.3 kg 91 kg    Examination: General: Overweight middle aged male, resting comfortably. HENT: [place]/AT, EOMI. Lungs: Clear, sonorous respirations. Cardiovascular: RRR, no MRG. Abdomen: Soft, non-tender, non-distended. Extremities: No acute deformities. Neuro: Somnolent but opens eyes to voice.   Assessment & Plan:   NSTEMI - management per cardiology - ASA, heparin infusion  Alcohol withdrawal - Continue precedex infusion, wean to off as able - Continue PRN ativan - Thiamine, folic acid  HTN - Management per cardiology (coreg)  Best practice:  Diet: NPO Pain/Anxiety/Delirium protocol (if indicated): Precedex VAP protocol (if indicated): NA DVT prophylaxis: Heparin infusion for NSTEMI GI prophylaxis: per primary Glucose control: NA Mobility: BR Code Status: FULL Family Communication: per primary Disposition: ICU   Montey Hora, [facility] 08/03/2019, 7:48 AM   PCCM attending:  58 year old gentleman admitted for NSTEMI history of alcohol use, alcohol withdrawal.  Currently on Precedex.  Pulmonary critical care asked to see due to Precedex use.  BP 108/63   Pulse 76   Temp (!) 100.8 F (38.2 C) (Axillary)   Resp (!) 25   Ht 5\' 10"  (1.778 m)   Wt 91 kg   SpO2 94%   BMI 28.79 kg/m  General: Resting in bed no distress appears comfortable Heart: Regular rate rhythm Lungs: Clear to auscultation bilaterally Extremities: Warm to touch no significant edema  Labs: Reviewed Chest x-ray: Reviewed, basilar atelectasis no effusion no pneumothorax, nothing acute  Assessment: NSTEMI Acute alcohol withdrawal on Precedex infusion  Plan: Continue Precedex at this time Start Librium 5 mg 3 times daily Wean from Precedex As needed Ativan as needed. Continue multivitamin supplementation folic  acid thiamine for alcohol use history.  Josephine Igo, DO [facility] 08/03/2019 4:03 PM

## 2019-08-03 NOTE — Progress Notes (Signed)
eLink Physician-Brief Progress Note Patient Name: Barry Gutierrez DOB: February 21, 1961 MRN: [id number]   Date of Service  08/03/2019  HPI/Events of Note  Hypotension - BP = 87/68. Currently on Nitroglycerin and Precedex IV infusions.   eICU Interventions  Will order: 1. Bolus with 0.9 NaCl 1 liter IV over 1 hour now.      Intervention Category Major Interventions: Hypotension - evaluation and management  [name] 08/03/2019, 4:38 AM

## 2019-08-03 NOTE — Progress Notes (Signed)
ANTICOAGULATION CONSULT NOTE - Follow Up Consult  Pharmacy Consult for heparin Indication: NSTEMI  Labs: Recent Labs    07/31/19 2227  08/01/19 0154  08/01/19 1601 08/01/19 1722 08/02/19 0222 08/02/19 0601 08/02/19 0755 08/02/19 0943 08/03/19 0442  HGB  --   --  16.8  --   --   --  15.8  --   --   --  15.1  HCT  --   --  46.7  --   --   --  44.1  --   --   --  43.5  PLT  --   --  157  --   --   --  150  --   --   --  137*  APTT  --   --   --   --   --   --   --   --   --  121*  --   LABPROT 13.0  --   --   --   --   --   --   --   --  13.5  --   INR 1.0  --   --   --   --   --   --   --   --  1.0  --   HEPARINUNFRC  --   --   --    < > <0.10* 0.13* 0.15*  --   --   --  0.18*  CREATININE  --   --  0.90  --   --   --  0.97 0.92  --   --   --   TROPONINIHS  --    < > 1,065*  --  1,257*  --   --  1,333* 2,329*  --   --    < > = values in this interval not displayed.    Assessment: 58yo male subtherapeutic on heparin after resuming; no gtt issues or signs of bleeding per RN.  Goal of Therapy:  Heparin level 0.3-0.7 units/ml   Plan:  Will give heparin 3000 units IV bolus x1 and increase heparin gtt by 3 units/kg/hr to 1900 units/hr and check level in 6 hours.    Wynona Neat, PharmD, BCPS  08/03/2019,5:46 AM

## 2019-08-04 ENCOUNTER — Encounter (HOSPITAL_COMMUNITY)
Admission: EM | Disposition: A | Discharge status: Home / Self Care | Payer: Self-pay | Source: Home / Self Care | Attending: Internal Medicine

## 2019-08-04 DIAGNOSIS — I2510 Atherosclerotic heart disease of native coronary artery without angina pectoris: Secondary | ICD-10-CM

## 2019-08-04 DIAGNOSIS — F10931 Alcohol use, unspecified with withdrawal delirium: Secondary | ICD-10-CM

## 2019-08-04 DIAGNOSIS — E785 Hyperlipidemia, unspecified: Secondary | ICD-10-CM

## 2019-08-04 DIAGNOSIS — F1010 Alcohol abuse, uncomplicated: Secondary | ICD-10-CM

## 2019-08-04 DIAGNOSIS — F10231 Alcohol dependence with withdrawal delirium: Secondary | ICD-10-CM

## 2019-08-04 HISTORY — PX: LEFT HEART CATH AND CORONARY ANGIOGRAPHY: CATH118249

## 2019-08-04 LAB — CBC
HCT: 40.6 % (ref 39.0–52.0)
HCT: 38.9 % — ABNORMAL LOW (ref 39.0–52.0)
Hemoglobin: 14.3 g/dL (ref 13.0–17.0)
Hemoglobin: 13.6 g/dL (ref 13.0–17.0)
MCH: 34 pg (ref 26.0–34.0)
MCH: 34.2 pg — ABNORMAL HIGH (ref 26.0–34.0)
MCHC: 35.2 g/dL (ref 30.0–36.0)
MCHC: 35 g/dL (ref 30.0–36.0)
MCV: 96.4 fL (ref 80.0–100.0)
MCV: 97.7 fL (ref 80.0–100.0)
Platelets: 127 10*3/uL — ABNORMAL LOW (ref 150–400)
Platelets: 128 10*3/uL — ABNORMAL LOW (ref 150–400)
RBC: 4.21 MIL/uL — ABNORMAL LOW (ref 4.22–5.81)
RBC: 3.98 MIL/uL — ABNORMAL LOW (ref 4.22–5.81)
RDW: 12.9 % (ref 11.5–15.5)
RDW: 12.7 % (ref 11.5–15.5)
WBC: 12.1 10*3/uL — ABNORMAL HIGH (ref 4.0–10.5)
WBC: 10.3 10*3/uL (ref 4.0–10.5)
nRBC: 0 % (ref 0.0–0.2)
nRBC: 0 % (ref 0.0–0.2)

## 2019-08-04 LAB — CREATININE, SERUM
Creatinine, Ser: 0.93 mg/dL (ref 0.61–1.24)
GFR calc Af Amer: >60 mL/min (ref >60)
GFR calc non Af Amer: >60 mL/min (ref >60)

## 2019-08-04 LAB — HEPARIN LEVEL (UNFRACTIONATED)
Heparin Unfractionated: 0.44 IU/mL (ref 0.30–0.70)
Heparin Unfractionated: 0.37 IU/mL (ref 0.30–0.70)

## 2019-08-04 SURGERY — LEFT HEART CATH AND CORONARY ANGIOGRAPHY
Anesthesia: LOCAL

## 2019-08-04 MED ORDER — VERAPAMIL HCL 2.5 MG/ML IV SOLN
INTRAVENOUS | Status: AC
  Filled 2019-08-04: qty 2

## 2019-08-04 MED ORDER — SODIUM CHLORIDE 0.9 % IV SOLN: 250.0000 mL | INTRAVENOUS | Status: DC | PRN

## 2019-08-04 MED ORDER — IOHEXOL 350 MG/ML SOLN
INTRAVENOUS | Status: DC | PRN
  Administered 2019-08-04: 50 mL

## 2019-08-04 MED ORDER — CARVEDILOL 6.25 MG PO TABS: 6.2500 mg | ORAL_TABLET | Freq: Two times a day (BID) | ORAL | 6 refills | Status: DC

## 2019-08-04 MED ORDER — MIDAZOLAM HCL 2 MG/2ML IJ SOLN
INTRAMUSCULAR | Status: DC | PRN
  Administered 2019-08-04: 1 mg via INTRAVENOUS

## 2019-08-04 MED ORDER — PANTOPRAZOLE SODIUM 40 MG PO TBEC: 40.0000 mg | DELAYED_RELEASE_TABLET | Freq: Every day | ORAL | 2 refills | Status: DC

## 2019-08-04 MED ORDER — FENTANYL CITRATE (PF) 100 MCG/2ML IJ SOLN
INTRAMUSCULAR | Status: DC | PRN
  Administered 2019-08-04: 25 ug via INTRAVENOUS

## 2019-08-04 MED ORDER — LABETALOL HCL 5 MG/ML IV SOLN: 10.0000 mg | INTRAVENOUS | Status: DC | PRN

## 2019-08-04 MED ORDER — VERAPAMIL HCL 2.5 MG/ML IV SOLN
INTRAVENOUS | Status: DC | PRN
  Administered 2019-08-04: 10 mL via INTRA_ARTERIAL

## 2019-08-04 MED ORDER — SODIUM CHLORIDE 0.9 % WEIGHT BASED INFUSION: 1.0000 mL/kg/h | INTRAVENOUS | Status: DC

## 2019-08-04 MED ORDER — FUROSEMIDE 20 MG PO TABS: 20.0000 mg | ORAL_TABLET | Freq: Every day | ORAL | Status: DC

## 2019-08-04 MED ORDER — ASPIRIN 81 MG PO CHEW: 81.0000 mg | CHEWABLE_TABLET | ORAL | Status: AC

## 2019-08-04 MED ORDER — MIDAZOLAM HCL 2 MG/2ML IJ SOLN
INTRAMUSCULAR | Status: AC
  Filled 2019-08-04: qty 2

## 2019-08-04 MED ORDER — FOLIC ACID 1 MG PO TABS
1.0000 mg | ORAL_TABLET | Freq: Every day | ORAL | Status: DC
  Administered 2019-08-04: 1 mg via ORAL
  Filled 2019-08-04: qty 1

## 2019-08-04 MED ORDER — LIDOCAINE HCL (PF) 1 % IJ SOLN
INTRAMUSCULAR | Status: AC
  Filled 2019-08-04: qty 30

## 2019-08-04 MED ORDER — ATORVASTATIN CALCIUM 80 MG PO TABS: 80.0000 mg | ORAL_TABLET | Freq: Every day | ORAL | 6 refills | Status: DC

## 2019-08-04 MED ORDER — HEPARIN SODIUM (PORCINE) 1000 UNIT/ML IJ SOLN
INTRAMUSCULAR | Status: DC | PRN
  Administered 2019-08-04: 5000 [IU] via INTRAVENOUS

## 2019-08-04 MED ORDER — SODIUM CHLORIDE 0.9% FLUSH: 3.0000 mL | Freq: Two times a day (BID) | INTRAVENOUS | Status: DC

## 2019-08-04 MED ORDER — NITROGLYCERIN 0.4 MG SL SUBL: 0.4000 mg | SUBLINGUAL_TABLET | SUBLINGUAL | 1 refills | Status: DC | PRN

## 2019-08-04 MED ORDER — HEPARIN (PORCINE) IN NACL 1000-0.9 UT/500ML-% IV SOLN
INTRAVENOUS | Status: DC | PRN
  Administered 2019-08-04: 500 mL

## 2019-08-04 MED ORDER — SODIUM CHLORIDE 0.9% FLUSH: 3.0000 mL | INTRAVENOUS | Status: DC | PRN

## 2019-08-04 MED ORDER — ENOXAPARIN SODIUM 40 MG/0.4ML ~~LOC~~ SOLN: 40.0000 mg | SUBCUTANEOUS | Status: DC

## 2019-08-04 MED ORDER — SODIUM CHLORIDE 0.9 % WEIGHT BASED INFUSION
3.0000 mL/kg/h | INTRAVENOUS | Status: DC
  Administered 2019-08-04: 3 mL/kg/h via INTRAVENOUS

## 2019-08-04 MED ORDER — LIDOCAINE HCL (PF) 1 % IJ SOLN
INTRAMUSCULAR | Status: DC | PRN
  Administered 2019-08-04: 3 mL

## 2019-08-04 MED ORDER — HEPARIN SODIUM (PORCINE) 1000 UNIT/ML IJ SOLN
INTRAMUSCULAR | Status: AC
  Filled 2019-08-04: qty 1

## 2019-08-04 MED ORDER — HEPARIN (PORCINE) IN NACL 1000-0.9 UT/500ML-% IV SOLN
INTRAVENOUS | Status: AC
  Filled 2019-08-04: qty 1000

## 2019-08-04 MED ORDER — SODIUM CHLORIDE 0.9 % IV SOLN: INTRAVENOUS | Status: DC

## 2019-08-04 MED ORDER — ASPIRIN 81 MG PO TBEC: 81.0000 mg | DELAYED_RELEASE_TABLET | Freq: Every day | ORAL | 3 refills | Status: AC

## 2019-08-04 MED ORDER — FENTANYL CITRATE (PF) 100 MCG/2ML IJ SOLN
INTRAMUSCULAR | Status: AC
  Filled 2019-08-04: qty 2

## 2019-08-04 MED ORDER — FUROSEMIDE 20 MG PO TABS: 20.0000 mg | ORAL_TABLET | Freq: Every day | ORAL | 1 refills | Status: DC

## 2019-08-04 MED FILL — FUROSEMIDE 20 MG TAB: 20 | 30 days supply | Qty: 30 | Fill #0

## 2019-08-04 MED FILL — ATORVASTATIN CALCIUM 80 MG: 80 | 30 days supply | Qty: 30 | Fill #0

## 2019-08-04 MED FILL — CARVEDILOL 6.25 MG TABLET: 6.25 | 30 days supply | Qty: 60 | Fill #0

## 2019-08-04 MED FILL — PANTOPRAZOLE SOD DR 40 MG T: 40 | 30 days supply | Qty: 30 | Fill #0

## 2019-08-04 MED FILL — NITROGLYCERIN 0.4 MG TAB SL: 0.4 | 8 days supply | Qty: 25 | Fill #0

## 2019-08-04 MED FILL — ASPIRIN LOW DOSE 81 MG TBEC: 81 | 90 days supply | Qty: 90 | Fill #0

## 2019-08-04 SURGICAL SUPPLY — 10 items
CATH INFINITI 5 FR JL3.5 (CATHETERS) ×1 IMPLANT
CATH INFINITI 5FR JK (CATHETERS) ×1 IMPLANT
DEVICE RAD COMP TR BAND LRG (VASCULAR PRODUCTS) ×1 IMPLANT
GLIDESHEATH SLEND SS 6F .021 (SHEATH) ×1 IMPLANT
GUIDEWIRE INQWIRE 1.5J.035X260 (WIRE) IMPLANT
INQWIRE 1.5J .035X260CM (WIRE) ×2
KIT HEART LEFT (KITS) ×2 IMPLANT
PACK CARDIAC CATHETERIZATION (CUSTOM PROCEDURE TRAY) ×2 IMPLANT
TRANSDUCER W/STOPCOCK (MISCELLANEOUS) ×2 IMPLANT
TUBING CIL FLEX 10 FLL-RA (TUBING) ×2 IMPLANT

## 2019-08-04 NOTE — Progress Notes (Signed)
PA Furth advised patient is okay to discharge once TR band deflated.  RN deflating TR band per protocol and informed patient of procedure.   TR band just finished deflating at 1650 but advised patient we leave TR band on for 30 minutes before applying dressing and take BP to confirm no issues.   1658 - patient began to dress and removed his BP cuff and EKG monitor leads.  Patient advised he no longer wishes to be hooked up to monitor.  Transition of care pharmacy rep with patient.   RN will remove IV's and provide discharge instructions to patient once pharmacy is finished.

## 2019-08-04 NOTE — Progress Notes (Signed)
ANTICOAGULATION CONSULT NOTE   Pharmacy Consult for heparin  Indication: chest pain/ACS  No Known Allergies  Patient Measurements: Height: 5\' 10"  (177.8 cm) Weight: 203 lb 7.8 oz (92.3 kg) IBW/kg (Calculated) : 73 Heparin Dosing Weight: 91.9 kg   Vital Signs: Temp: 99.1 F (37.3 C) (12/02 0824) Temp Source: Oral (12/02 0824) BP: 125/73 (12/02 0900) Pulse Rate: 87 (12/02 0900)  Labs: Recent Labs    08/02/19 0222 08/02/19 0601 08/02/19 0755 08/02/19 0943 08/03/19 0442  08/03/19 1845 08/04/19 0306 08/04/19 0934  HGB 15.8  --   --   --  15.1  --   --  14.3  --   HCT 44.1  --   --   --  43.5  --   --  40.6  --   PLT 150  --   --   --  137*  --   --  127*  --   APTT  --   --   --  121*  --   --   --   --   --   LABPROT  --   --   --  13.5  --   --   --   --   --   INR  --   --   --  1.0  --   --   --   --   --   HEPARINUNFRC 0.15*  --   --   --  0.18*   < > 0.27* 0.44 0.37  CREATININE 0.97 0.92  --   --  1.10  --   --   --   --   TROPONINIHS  --  1,333* 2,329*  --   --   --   --   --   --    < > = values in this interval not displayed.    Estimated Creatinine Clearance: 85.6 mL/min (by C-G formula based on SCr of 1.1 mg/dL).  Assessment: 58 yo male presented on 08/02/2019 after leaving AMA early this AM with worsening chest pain and SOB. Pharmacy consulted to dose heparin for ACS.   11/30 cath cancelled due to agitation, will likely take today. Heparin continues at goal this morning without complication. Hgb slight trend down but within normal limits. Plt count low but stable.   Goal of Therapy:  Heparin level 0.3-0.7 units/ml Monitor platelets by anticoagulation protocol: Yes   Plan:  Continue heparin at 2000 units/hr Follow up cath plans  Erin Hearing PharmD., BCPS Clinical Pharmacist 08/04/2019 10:50 AM  Please check AMION for all [place] numbers  08/04/2019 10:48 AM

## 2019-08-04 NOTE — Progress Notes (Addendum)
Progress Note  Patient Name: Barry Gutierrez Date of Encounter: 08/04/2019  Primary Cardiologist: Elouise Munroe, MD   Subjective   Patient is awake and alert and states "he would like to get done whatever he needs to get done". Denies recurrent chest pain. On 2L O2.  Inpatient Medications    Scheduled Meds: . aspirin EC  81 mg Oral Daily  . atorvastatin  80 mg Oral q1800  . carvedilol  6.25 mg Oral BID WC  . chlordiazePOXIDE  5 mg Oral TID  . Chlorhexidine Gluconate Cloth  6 each Topical Daily  . folic acid  1 mg Oral Daily  . mouth rinse  15 mL Mouth Rinse BID  .  morphine injection  4 mg Intravenous Once  . pantoprazole  40 mg Oral Daily  . sodium chloride flush  3 mL Intravenous Q12H  . thiamine  100 mg Oral Daily   Or  . thiamine  100 mg Intravenous Daily   Continuous Infusions: . sodium chloride    . heparin 2,000 Units/hr (08/04/19 0600)  . nitroGLYCERIN 15 mcg/min (08/04/19 0600)   PRN Meds: sodium chloride, acetaminophen, LORazepam, nitroGLYCERIN, ondansetron (ZOFRAN) IV, sodium chloride flush, zolpidem   Vital Signs    Vitals:   08/04/19 0603 08/04/19 0611 08/04/19 0630 08/04/19 0824  BP: (!) 91/48 (!) 97/49 (!) 97/55   Pulse: 83 84 85   Resp: 19 20 (!) 25   Temp:    99.1 F (37.3 C)  TempSrc:    Oral  SpO2: 94% 93% 92%   Weight:      Height:        Intake/Output Summary (Last 24 hours) at 08/04/2019 0926 Last data filed at 08/04/2019 0600 Gross per 24 hour  Intake 764.73 ml  Output 985 ml  Net -220.27 ml   Last 3 Weights 08/04/2019 08/03/2019 08/02/2019  Weight (lbs) 200 lb 9.9 oz 200 lb 9.9 oz 205 lb 11 oz  Weight (kg) 91 kg 91 kg 93.3 kg      Telemetry    NSR; HR 70-80s, rare PVC - Personally Reviewed  ECG    NSR, 85 bpm, minimal STE aVR, V1, q waves inferior leads, TWI aVL - Personally Reviewed  Physical Exam   GEN: No acute distress.   Neck: No JVD Cardiac: RRR, no murmurs, rubs, or gallops.  Respiratory: mild rhonchi  bilaterally. GI: Soft, nontender, non-distended  MS: No edema; No deformity. Neuro:  Nonfocal  Psych: Normal affect   Labs    High Sensitivity Troponin:   Recent Labs  Lab 07/31/19 2240 08/01/19 0154 08/01/19 0712 08/02/19 0601 08/02/19 0755  TROPONINIHS 728* 1,065* 1,257* 1,333* 2,329*      Chemistry Recent Labs  Lab 07/31/19 2240  08/02/19 0222 08/02/19 0601 08/03/19 0442  NA  --    < > 136 137 137  K  --    < > 3.5 4.7 3.9  CL  --    < > 102 101 104  CO2  --    < > 22 25 23   GLUCOSE  --    < > 116* 117* 113*  BUN  --    < > 9 10 9   CREATININE  --    < > 0.97 0.92 1.10  CALCIUM  --    < > 8.9 9.5 9.1  PROT 6.7  --  6.3*  --   --   ALBUMIN 3.9  --  3.6  --   --  AST 46*  --  42*  --   --   ALT 39  --  34  --   --   ALKPHOS 61  --  54  --   --   BILITOT 1.4*  --  1.5*  --   --   GFRNONAA  --    < > >60 >60 >60  GFRAA  --    < > >60 >60 >60  ANIONGAP  --    < > 12 11 10    < > = values in this interval not displayed.     Hematology Recent Labs  Lab 08/02/19 0222 08/03/19 0442 08/04/19 0306  WBC 10.1 11.2* 12.1*  RBC 4.67 4.47 4.21*  HGB 15.8 15.1 14.3  HCT 44.1 43.5 40.6  MCV 94.4 97.3 96.4  MCH 33.8 33.8 34.0  MCHC 35.8 34.7 35.2  RDW 12.5 12.6 12.9  PLT 150 137* 127*    BNP Recent Labs  Lab 07/31/19 2240  BNP 313.9*     DDimer No results for input(s): DDIMER in the last 168 hours.   Radiology    No results found.  Cardiac Studies   Echo 08/01/19 1. Left ventricular ejection fraction, by visual estimation, is 60 to 65%. The left ventricle has normal function. There is mildly increased left ventricular hypertrophy. Probable mild hypokinesis of the basal to mid inferior wall. 2. Definity contrast agent was given IV to delineate the left ventricular endocardial borders. 3. Left ventricular diastolic parameters are consistent with Grade I diastolic dysfunction (impaired relaxation). 4. Global right ventricle has normal systolic  function.The right ventricular size is normal. No increase in right ventricular wall thickness. 5. Left atrial size was normal. 6. Right atrial size was normal. 7. The mitral valve is normal in structure. No evidence of mitral valve regurgitation. 8. The tricuspid valve is normal in structure. Tricuspid valve regurgitation is trivial. 9. The aortic valve is normal in structure. Aortic valve regurgitation is not visualized. Mild aortic valve sclerosis without stenosis. 10. The pulmonic valve was grossly normal. Pulmonic valve regurgitation is trivial.  Patient Profile     58 y.o. male with a no cardiac hx and tobacco usewhowas admitted over the weekend for CP/NSTEMI but left AMA and returned to the ED for chest painon 08/02/2019 and was taken back to cath lab but became aggressive and cath was aborted and patient was ultimately transferred to ICU and placed on Precedex.   Assessment & Plan    NSTEMI Patient taken back for cath for NSTEMI troponin peak at 2,000, but was aborted due to aggressive behavior/alcohol withdrawal and patient was ultimately transferred to ICU and placed on Precedex. Now he has weaned off.  - Echo with normal EF with no wall motion abnormalities - At this point patient is awake and alert and states he would like to proceed with cardiac cath.  - continue Aspirin and heparin.  - Patient also on NTG infusion>> can  Likely switch to PO nitrate - continue statin, BB - Discussed tobacco and alcohol cessation - Will discuss option of cath with MD. 08/04/2019 if patient will be compliant with medications. Discussed this with the patient and he feels he can.   HLD - LDL 128 - patient was started in Lipitor. AST mildly elevated. Will need follow up labs outpatient  HTN - continue coreg - on IV NTG>> Can likely switch to PO  Alcohol Withdrawal/Tobacco use - has weaned off Precedex - continue thiamine/folic acid as needed - Patient  has not had cigarette in 4 days>>  hopes he can decrease/discontinue tobacco use   For questions or updates, please contact [facility] Please consult [email or web address] for contact info under        Signed, Cadence H Furth, PA-C  08/04/2019, 9:26 AM  ---------------------------------------------------------------------------------------------   History and all data above reviewed.  Patient examined.  I agree with the findings as above.  Barry Gutierrez has been weaned off precedex and feels ready to proceed with coronary angiography.   Constitutional: No acute distress Eyes: pupils equally round and reactive to light, sclera non-icteric, normal conjunctiva and lids ENMT: normal dentition, moist mucous membranes Cardiovascular: regular rhythm, normal rate, no murmurs. S1 and S2 normal. Radial pulses normal bilaterally. No jugular venous distention.  Respiratory: clear to auscultation bilaterally GI : normal bowel sounds, soft and nontender. No distention.   MSK: extremities warm, well perfused. No edema.  NEURO: grossly nonfocal exam, moves all extremities. PSYCH: alert and oriented x 3, normal mood and affect.   All available labs, radiology testing, previous records reviewed. Agree with documented assessment and plan of my colleague as stated above with the following additions or changes:  Active Problems:   NSTEMI (non-ST elevated myocardial infarction) (HCC)   Unstable angina (HCC)    Plan: NSTEMI - he feels ready to proceed with cath and appears calm and cooperative today. Consent discussed and obtained as noted below.   INFORMED CONSENT: I have reviewed the risks, indications, and alternatives to cardiac catheterization, possible angioplasty, and stenting with the patient. Risks include but are not limited to bleeding, infection, vascular injury, stroke, myocardial infection, arrhythmia, kidney injury, radiation-related injury in the case of prolonged fluoroscopy use, emergency cardiac surgery, and death.  The patient understands the risks of serious complication is 1-2 in 1000 with diagnostic cardiac cath and 1-2% or less with angioplasty/stenting.   Alcohol abuse/withdrawl - the patient denies difficulties with alcohol abuse and does not have insight into why he became combative on 08/02/2019. We discussed this briefly and he appears to be in the pre-contemplative state of motivation/change.    Length of Stay:  LOS: 2 days   Barry Leonhardt A Twan Harkin, MD [facility] 11:20 AM  08/04/2019   

## 2019-08-04 NOTE — Progress Notes (Signed)
   NAME:  Barry Gutierrez, MRN:  [id number], DOB:  Jan 25, 1961, LOS: 2 ADMISSION DATE:  08/02/2019, CONSULTATION DATE:  11/30 REFERRING MD:  Dr. Margaretann Loveless (cardiology), CHIEF COMPLAINT:  ICU   Brief History   58 year old male with ETOH history admitted for NSTEMI. Unable to undergo LHC due to withdrawal symptoms. Precedex infusion started and transfer to ICU  History of present illness   58 year old male with PMH as below, which is significant for ETOH, admitted on 11/28 for chest pain x several days and ruled in for NSTEMI with troponin peaking at 1200. He was treated with IV heparin and nitroglycerine, and was scheduled for Cath on 11/30. However, he left AMA, in the early AM hours of 11/30. He then presented again to the ED around 0430 AM that same day when chest pain became worse. He was re-admitted by the cardiology service and taken for cardiac cath, however, he became combative and agitated. The procedure was unable to be completed. He was treated with CIWA protocol for presumed alcohol withdrawal. Around 2000 on 11/30 he became combative ripping out IV lines. He was given 4mg  ativan and 5mg  haldol prior to calming down. He was then started on precedex and was transferred to ICU.  Past Medical History   has a past medical history of Allergy.   Significant Hospital Events   11/28 admit for STEMI 11/30 left AMA, came back, went for cath but unable due to combativeness. Precedex to ICU.   Consults:    Procedures:    Significant Diagnostic Tests:    Micro Data:    Antimicrobials:     Interim history/subjective:  Awake alert off Precedex  Objective   Blood pressure (!) 97/55, pulse 85, temperature 99.1 F (37.3 C), temperature source Oral, resp. rate (!) 25, height 5\' 10"  (1.778 m), weight 91 kg, SpO2 92 %.        Intake/Output Summary (Last 24 hours) at 08/04/2019 6384 Last data filed at 08/04/2019 0600 Gross per 24 hour  Intake 764.73 ml  Output 985 ml  Net -220.27 ml   Filed Weights   08/02/19 0830 08/03/19 0446 08/04/19 0500  Weight: 93.3 kg 91 kg 91 kg    Examination: General: Middle-age male no acute distress off Precedex HEENT: No JVD or lymphadenopathy is appreciated Neuro: Grossly intact oriented x3 CV: Heart sounds are regular PULM: Faint rhonchi  GI: soft, bsx4 active  GU: Amber urine Extremities: warm/dry,  edema  Skin: no rashes or lesions    Assessment & Plan:   NSTEMI Management per cardiology   Alcohol withdrawal Precedex has been discontinued Continue as needed Ativan Continue thiamine folic acid Pulmonary critical care will sign off at this time  HTN Per cardiology  Best practice:  Diet: NPO Pain/Anxiety/Delirium protocol (if indicated): 08/04/2019 currently off Precedex and has been discontinued from his MAR.  Pulmonary critical care will sign off 08/04/2019 VAP protocol (if indicated): NA DVT prophylaxis: Heparin infusion for NSTEMI GI prophylaxis: per primary Glucose control: NA Mobility: BR Code Status: FULL Family Communication: per primary Disposition: ICU   Richardson Landry Jamien Casanova ACNP Acute Care Nurse Practitioner [place] Please consult [place] 08/04/2019, 9:03 AM

## 2019-08-04 NOTE — Progress Notes (Signed)
ANTICOAGULATION CONSULT NOTE - Follow Up Consult  Pharmacy Consult for heparin Indication: NSTEMI  Labs: Recent Labs    08/01/19 0712  08/02/19 0222 08/02/19 0601 08/02/19 0755 08/02/19 0943 08/03/19 0442 08/03/19 1201 08/03/19 1845 08/04/19 0306  HGB  --    < > 15.8  --   --   --  15.1  --   --  14.3  HCT  --   --  44.1  --   --   --  43.5  --   --  40.6  PLT  --   --  150  --   --   --  137*  --   --  127*  APTT  --   --   --   --   --  121*  --   --   --   --   LABPROT  --   --   --   --   --  13.5  --   --   --   --   INR  --   --   --   --   --  1.0  --   --   --   --   HEPARINUNFRC <0.10*   < > 0.15*  --   --   --  0.18* 0.49 0.27* 0.44  CREATININE  --   --  0.97 0.92  --   --  1.10  --   --   --   TROPONINIHS 1,257*  --   --  1,333* 2,329*  --   --   --   --   --    < > = values in this interval not displayed.    Assessment/Plan:  58yo male therapeutic on heparin after rate change. Will continue gtt at current rate and confirm stable with additional level.   Wynona Neat, PharmD, BCPS  08/04/2019,3:57 AM

## 2019-08-04 NOTE — Progress Notes (Signed)
Patient's cell phone charger left in room.  Called patient's cell phone, no answer and his voicemail was not set up so unable to notify him that his cell phone charger was left behind in his room.   Called patient's emergency contact, Barry Gutierrez, informed her that patient left his cell phone charger and requested she notify patient and request he call to set up picking up his charger.

## 2019-08-04 NOTE — Discharge Summary (Addendum)
Discharge Summary    Patient ID: Barry Gutierrez MRN: [id number]; DOB: 02-20-1961  Admit date: 08/02/2019 Discharge date: 08/04/2019  Primary Care Provider: Myles Lipps, MD  Primary Cardiologist: Parke Poisson, MD  Primary Electrophysiologist:  None   Discharge Diagnoses    Principal Problem:   NSTEMI (non-ST elevated myocardial infarction) [facility]) Active Problems:   Cigarette smoker   Essential hypertension   Alcohol abuse   Unstable angina (HCC)   Alcohol withdrawal delirium (HCC)   Hyperlipidemia   CAD (coronary artery disease)   Diagnostic Studies/Procedures    Echo 08/01/19 (from the first admission)   1. Left ventricular ejection fraction, by visual estimation, is 60 to 65%. The left ventricle has normal function. There is mildly increased left ventricular hypertrophy. Probable mild hypokinesis of the basal to mid inferior wall.  2. Definity contrast agent was given IV to delineate the left ventricular endocardial borders.  3. Left ventricular diastolic parameters are consistent with Grade I diastolic dysfunction (impaired relaxation).  4. Global right ventricle has normal systolic function.The right ventricular size is normal. No increase in right ventricular wall thickness.  5. Left atrial size was normal.  6. Right atrial size was normal.  7. The mitral valve is normal in structure. No evidence of mitral valve regurgitation.  8. The tricuspid valve is normal in structure. Tricuspid valve regurgitation is trivial.  9. The aortic valve is normal in structure. Aortic valve regurgitation is not visualized. Mild aortic valve sclerosis without stenosis. 10. The pulmonic valve was grossly normal. Pulmonic valve regurgitation is trivial.  Left Heart Cardiac catheterization 08/04/19  Mid RCA lesion is 100% stenosed.  Prox RCA lesion is 40% stenosed.  Ost Cx to Prox Cx lesion is 40% stenosed.   1.  Severe one-vessel coronary artery disease with occluded mid  right coronary artery with good left-to-right collaterals.  This is likely chronic over acute on chronic. 2.  Left ventricular angiography was not performed.  EF was normal by echo. 3.  Mildly to moderately elevated left ventricular end-diastolic pressure at 20 mmHg.  Recommendations: Recommend medical therapy for coronary artery disease. I added small dose furosemide given elevated LVEDP and reported dyspnea by the patient. _____________   History of Present Illness     Barry Gutierrez is a 58 y.o. male with no previous cardiac history, tobacco use, alcohol use who presented to the ED for Chest pain. Pain was described as crushing chest pain, worse with exertion, resolved with rest. Famotidine and BC powder did not relieve the pain. He reported associated dyspnea. The pain was intermittent at first but became persistent and the patient went to the ED. Troponin peaked to 1200 and he was started on IV heparin. He was admitted for NSTEMI. He had mild chest pain and was started on IV NTG. BB, statin and Aspirin were started as well.  Echo showed normal EF, mild LVH, probably hypokinesis of the basal to mid inferior wall and G1DD. The patient stayed over the weekend and became increasingly agitated and frustrated. He left AMA the night before his scheduled cardiac catheterization. The next day the patient noted worsening chest pain and went back into the ED. Pain was 10/10 with associated SOB.  Hospital Course     Consultants: CCM  In the ED Hs troponin peaked to over 2,000. Labs showed potassium 4.7, glucose 117, creatinine 0.92. AST 42. WBC 10.1, Hgb 15.8. EKG showed minimal ST depression inferior leads and the patient was started on IV heparin  and IV NTG. He was admitted and scheduled for cardiac catheterization that afternoon. Aspirin, Lipitor, and carvedilol were restarted. AST was found to be mildly elevated at 46. The patient was taken back for catheterization. Shortly after starting the  procedure he became progressively agitated and combative. He was unable to lie still or flat. He was given sedation and fentanyl but he did not improve. Cardiac cath was aborted and the patient was transferred to the floor. Overnight the patient was very anxious and became progressively combative. He was given Ativan and Haldol with no change. He ripped out the IV lines twice. Security was called to the scene. Critical care was consulted for sedation. He was transferred to the ICU and started on Precedex. The patient remained sedated and sleepy the next 24 hours.  He was treated with CIWA protocol, started on Folic acid and thiamine. Oral medications were given as tolerated. The next day the patient was weaned off the Precedex. He was awake and alert and ready to get the cardiac catheterization done. Alcohol and tobacco cessation were discussed with the patient. The concern for noncompliance was also discussed with the patient. The patient was willing to go through with the cardiac catheterization. He was brought back to the lab. Cath showed severe one-vessel CAD with occluded mid RCA with good left to right collaterals and LVEDP 20 mmHg. No PCI was performed. Post cath recommendations were for continued medical management. A small dose of Lasix was added as well. The patient tolerated the procedure well and was taken back to the floor. The right radial cath site remained stable with no signs of hematoma or bruit. Creatinine 0.93 and Hemoglobin stable at 13.6. Will plan for same day discharge, especially given the fact he left AMA last time we want to make sure he receives his medications. I spoke with the patient and the nurse about the plan. The patient voiced his understanding. New prescriptions for Lipitor, Carvedilol, Aspirin, and Lasix 20 mg [facility] Hospital follow up was arranged. Patient will need evaluation of continuation of lasix, recheck Labs (including AST) for  lipid management.   The patient was evaluated by Dr. Jacques NavyAcharya on 08/04/19 and felt to be stable for discharge.   Did the patient have an acute coronary syndrome (MI, NSTEMI, STEMI, etc) this admission?:  Yes                               AHA/ACC Clinical Performance & Quality Measures: 1. Aspirin prescribed? - Yes 2. ADP Receptor Inhibitor (Plavix/Clopidogrel, Brilinta/Ticagrelor or Effient/Prasugrel) prescribed (includes medically managed patients)? - No - noncompliance and alcohol use 3. Beta Blocker prescribed? - Yes 4. High Intensity Statin (Lipitor 40-80mg  or Crestor 20-40mg ) prescribed? - Yes 5. EF assessed during THIS hospitalization? - Yes 6. For EF <40%, was ACEI/ARB prescribed? - Not Applicable (EF >/= 40%) 7. For EF <40%, Aldosterone Antagonist (Spironolactone or Eplerenone) prescribed? - Not Applicable (EF >/= 40%) 8. Cardiac Rehab Phase II ordered (Included Medically managed Patients)? - No - noncompliance   _____________  Discharge Vitals Blood pressure 113/75, pulse 87, temperature (!) 100.6 F (38.1 C), temperature source Oral, resp. rate 17, height 5\' 10"  (1.778 m), weight 92.3 kg, SpO2 95 %.  Filed Weights   08/03/19 0446 08/04/19 0500 08/04/19 1000  Weight: 91 kg 91 kg 92.3 kg    Labs & Radiologic Studies    CBC Recent  Labs    08/04/19 0306 08/04/19 1610  WBC 12.1* 10.3  HGB 14.3 13.6  HCT 40.6 38.9*  MCV 96.4 97.7  PLT 127* 283*   Basic Metabolic Panel Recent Labs    08/02/19 0601 08/03/19 0442  NA 137 137  K 4.7 3.9  CL 101 104  CO2 25 23  GLUCOSE 117* 113*  BUN 10 9  CREATININE 0.92 1.10  CALCIUM 9.5 9.1   Liver Function Tests Recent Labs    08/02/19 0222  AST 42*  ALT 34  ALKPHOS 54  BILITOT 1.5*  PROT 6.3*  ALBUMIN 3.6   No results for input(s): LIPASE, AMYLASE in the last 72 hours. High Sensitivity Troponin:   Recent Labs  Lab 07/31/19 2240 08/01/19 0154 08/01/19 0712 08/02/19 0601 08/02/19 0755  TROPONINIHS 728* 1,065*  1,257* 1,333* 2,329*    BNP Invalid input(s): POCBNP D-Dimer No results for input(s): DDIMER in the last 72 hours. Hemoglobin A1C No results for input(s): HGBA1C in the last 72 hours. Fasting Lipid Panel No results for input(s): CHOL, HDL, LDLCALC, TRIG, CHOLHDL, LDLDIRECT in the last 72 hours. Thyroid Function Tests No results for input(s): TSH, T4TOTAL, T3FREE, THYROIDAB in the last 72 hours.  Invalid input(s): FREET3 _____________  Dg Chest 2 View  Result Date: 08/02/2019 CLINICAL DATA:  Chest pain. EXAM: CHEST - 2 VIEW COMPARISON:  07/31/2019. FINDINGS: Mediastinum and hilar structures are normal. Heart size normal. Mild bibasilar subsegmental atelectasis. Tiny calcified nodule left upper lung most consistent with tiny granuloma. No interim change from prior exam. No pleural effusion or pneumothorax. No acute bony abnormality identified. IMPRESSION: Mild bibasilar subsegmental atelectasis. Exam otherwise unremarkable. Electronically Signed   By: Marcello Moores  Register   On: 08/02/2019 06:16   Dg Chest 2 View  Result Date: 07/31/2019 CLINICAL DATA:  Chest pain EXAM: CHEST - 2 VIEW COMPARISON:  Chest radiograph 11/12/2017 FINDINGS: Normal cardiac and mediastinal contours. No consolidative pulmonary opacities. No pleural effusion or pneumothorax. Thoracic spine degenerative changes. IMPRESSION: No acute cardiopulmonary process. Electronically Signed   By: Lovey Newcomer M.D.   On: 07/31/2019 21:08   Disposition   Pt is being discharged home today in good condition.  Follow-up Plans & Appointments    Follow-up Information    Ledora Bottcher, [place] Follow up on 08/19/2019.   Specialties: Physician Assistant, Cardiology, Radiology Why: Please go to hospital follow-up December 17th at 9:00 AM Contact information: [address] [place] [address] [phone]            Discharge Medications   Allergies as of 08/04/2019   No Known Allergies     Medication List     TAKE these medications   acetaminophen 325 MG tablet Commonly known as: TYLENOL Take 325-650 mg by mouth every 6 (six) hours as needed (for pain).   aspirin 81 MG EC tablet Take 1 tablet (81 mg total) by mouth daily. Start taking on: August 05, 2019   atorvastatin 80 MG tablet Commonly known as: LIPITOR Take 1 tablet (80 mg total) by mouth daily at 6 PM.   carvedilol 6.25 MG tablet Commonly known as: COREG Take 1 tablet (6.25 mg total) by mouth 2 (two) times daily with a meal.   famotidine 20 MG tablet Commonly known as: Pepcid One at bedtime What changed:   how much to take  how to take this  when to take this  reasons to take this  additional instructions   furosemide 20 MG tablet Commonly known  as: LASIX Take 1 tablet (20 mg total) by mouth daily. Start taking on: August 05, 2019   nitroGLYCERIN 0.4 MG SL tablet Commonly known as: NITROSTAT Place 1 tablet (0.4 mg total) under the tongue every 5 (five) minutes x 3 doses as needed for chest pain.   pantoprazole 40 MG tablet Commonly known as: Protonix Take 1 tablet (40 mg total) by mouth daily. Take 30-60 min before first meal of the day          Outstanding Labs/Studies   None  Duration of Discharge Encounter   Greater than 30 minutes including physician time.  Signed, Dannette Kinkaid Deunta Stall, PA-C 08/04/2019, 4:40 PM

## 2019-08-04 NOTE — Interval H&P Note (Signed)
Cath Lab Visit (complete for each Cath Lab visit)  Clinical Evaluation Leading to the Procedure:   ACS: Yes.    Non-ACS:  n/a    History and Physical Interval Note:  08/04/2019 2:41 PM  [name]  has presented today for surgery, with the diagnosis of nonstemi.  The various methods of treatment have been discussed with the patient and family. After consideration of risks, benefits and other options for treatment, the patient has consented to  Procedure(s): LEFT HEART CATH AND CORONARY ANGIOGRAPHY (N/A) as a surgical intervention.  The patient's history has been reviewed, patient examined, no change in status, stable for surgery.  I have reviewed the patient's chart and labs.  Questions were answered to the patient's satisfaction.     Barry Gutierrez

## 2019-08-04 NOTE — H&P (View-Only) (Signed)
Progress Note  Patient Name: Barry Gutierrez Date of Encounter: 08/04/2019  Primary Cardiologist: Elouise Munroe, MD   Subjective   Patient is awake and alert and states "he would like to get done whatever he needs to get done". Denies recurrent chest pain. On 2L O2.  Inpatient Medications    Scheduled Meds: . aspirin EC  81 mg Oral Daily  . atorvastatin  80 mg Oral q1800  . carvedilol  6.25 mg Oral BID WC  . chlordiazePOXIDE  5 mg Oral TID  . Chlorhexidine Gluconate Cloth  6 each Topical Daily  . folic acid  1 mg Oral Daily  . mouth rinse  15 mL Mouth Rinse BID  .  morphine injection  4 mg Intravenous Once  . pantoprazole  40 mg Oral Daily  . sodium chloride flush  3 mL Intravenous Q12H  . thiamine  100 mg Oral Daily   Or  . thiamine  100 mg Intravenous Daily   Continuous Infusions: . sodium chloride    . heparin 2,000 Units/hr (08/04/19 0600)  . nitroGLYCERIN 15 mcg/min (08/04/19 0600)   PRN Meds: sodium chloride, acetaminophen, LORazepam, nitroGLYCERIN, ondansetron (ZOFRAN) IV, sodium chloride flush, zolpidem   Vital Signs    Vitals:   08/04/19 0603 08/04/19 0611 08/04/19 0630 08/04/19 0824  BP: (!) 91/48 (!) 97/49 (!) 97/55   Pulse: 83 84 85   Resp: 19 20 (!) 25   Temp:    99.1 F (37.3 C)  TempSrc:    Oral  SpO2: 94% 93% 92%   Weight:      Height:        Intake/Output Summary (Last 24 hours) at 08/04/2019 0926 Last data filed at 08/04/2019 0600 Gross per 24 hour  Intake 764.73 ml  Output 985 ml  Net -220.27 ml   Last 3 Weights 08/04/2019 08/03/2019 08/02/2019  Weight (lbs) 200 lb 9.9 oz 200 lb 9.9 oz 205 lb 11 oz  Weight (kg) 91 kg 91 kg 93.3 kg      Telemetry    NSR; HR 70-80s, rare PVC - Personally Reviewed  ECG    NSR, 85 bpm, minimal STE aVR, V1, q waves inferior leads, TWI aVL - Personally Reviewed  Physical Exam   GEN: No acute distress.   Neck: No JVD Cardiac: RRR, no murmurs, rubs, or gallops.  Respiratory: mild rhonchi  bilaterally. GI: Soft, nontender, non-distended  MS: No edema; No deformity. Neuro:  Nonfocal  Psych: Normal affect   Labs    High Sensitivity Troponin:   Recent Labs  Lab 07/31/19 2240 08/01/19 0154 08/01/19 0712 08/02/19 0601 08/02/19 0755  TROPONINIHS 728* 1,065* 1,257* 1,333* 2,329*      Chemistry Recent Labs  Lab 07/31/19 2240  08/02/19 0222 08/02/19 0601 08/03/19 0442  NA  --    < > 136 137 137  K  --    < > 3.5 4.7 3.9  CL  --    < > 102 101 104  CO2  --    < > 22 25 23   GLUCOSE  --    < > 116* 117* 113*  BUN  --    < > 9 10 9   CREATININE  --    < > 0.97 0.92 1.10  CALCIUM  --    < > 8.9 9.5 9.1  PROT 6.7  --  6.3*  --   --   ALBUMIN 3.9  --  3.6  --   --  AST 46*  --  42*  --   --   ALT 39  --  34  --   --   ALKPHOS 61  --  54  --   --   BILITOT 1.4*  --  1.5*  --   --   GFRNONAA  --    < > >60 >60 >60  GFRAA  --    < > >60 >60 >60  ANIONGAP  --    < > 12 11 10    < > = values in this interval not displayed.     Hematology Recent Labs  Lab 08/02/19 0222 08/03/19 0442 08/04/19 0306  WBC 10.1 11.2* 12.1*  RBC 4.67 4.47 4.21*  HGB 15.8 15.1 14.3  HCT 44.1 43.5 40.6  MCV 94.4 97.3 96.4  MCH 33.8 33.8 34.0  MCHC 35.8 34.7 35.2  RDW 12.5 12.6 12.9  PLT 150 137* 127*    BNP Recent Labs  Lab 07/31/19 2240  BNP 313.9*     DDimer No results for input(s): DDIMER in the last 168 hours.   Radiology    No results found.  Cardiac Studies   Echo 08/01/19 1. Left ventricular ejection fraction, by visual estimation, is 60 to 65%. The left ventricle has normal function. There is mildly increased left ventricular hypertrophy. Probable mild hypokinesis of the basal to mid inferior wall. 2. Definity contrast agent was given IV to delineate the left ventricular endocardial borders. 3. Left ventricular diastolic parameters are consistent with Grade I diastolic dysfunction (impaired relaxation). 4. Global right ventricle has normal systolic  function.The right ventricular size is normal. No increase in right ventricular wall thickness. 5. Left atrial size was normal. 6. Right atrial size was normal. 7. The mitral valve is normal in structure. No evidence of mitral valve regurgitation. 8. The tricuspid valve is normal in structure. Tricuspid valve regurgitation is trivial. 9. The aortic valve is normal in structure. Aortic valve regurgitation is not visualized. Mild aortic valve sclerosis without stenosis. 10. The pulmonic valve was grossly normal. Pulmonic valve regurgitation is trivial.  Patient Profile     58 y.o. male with a no cardiac hx and tobacco usewhowas admitted over the weekend for CP/NSTEMI but left AMA and returned to the ED for chest painon 08/02/2019 and was taken back to cath lab but became aggressive and cath was aborted and patient was ultimately transferred to ICU and placed on Precedex.   Assessment & Plan    NSTEMI Patient taken back for cath for NSTEMI troponin peak at 2,000, but was aborted due to aggressive behavior/alcohol withdrawal and patient was ultimately transferred to ICU and placed on Precedex. Now he has weaned off.  - Echo with normal EF with no wall motion abnormalities - At this point patient is awake and alert and states he would like to proceed with cardiac cath.  - continue Aspirin and heparin.  - Patient also on NTG infusion>> can  Likely switch to PO nitrate - continue statin, BB - Discussed tobacco and alcohol cessation - Will discuss option of cath with MD. 08/04/2019 if patient will be compliant with medications. Discussed this with the patient and he feels he can.   HLD - LDL 128 - patient was started in Lipitor. AST mildly elevated. Will need follow up labs outpatient  HTN - continue coreg - on IV NTG>> Can likely switch to PO  Alcohol Withdrawal/Tobacco use - has weaned off Precedex - continue thiamine/folic acid as needed - Patient  has not had cigarette in 4 days>>  hopes he can decrease/discontinue tobacco use   For questions or updates, please contact [facility] Please consult [email or web address] for contact info under        Signed, Cadence Darby StallH Furth, PA-C  08/04/2019, 9:26 AM  ---------------------------------------------------------------------------------------------   History and all data above reviewed.  Patient examined.  I agree with the findings as above.  Barry Scalesavid L Gutierrez has been weaned off precedex and feels ready to proceed with coronary angiography.   Constitutional: No acute distress Eyes: pupils equally round and reactive to light, sclera non-icteric, normal conjunctiva and lids ENMT: normal dentition, moist mucous membranes Cardiovascular: regular rhythm, normal rate, no murmurs. S1 and S2 normal. Radial pulses normal bilaterally. No jugular venous distention.  Respiratory: clear to auscultation bilaterally GI : normal bowel sounds, soft and nontender. No distention.   MSK: extremities warm, well perfused. No edema.  NEURO: grossly nonfocal exam, moves all extremities. PSYCH: alert and oriented x 3, normal mood and affect.   All available labs, radiology testing, previous records reviewed. Agree with documented assessment and plan of my colleague as stated above with the following additions or changes:  Active Problems:   NSTEMI (non-ST elevated myocardial infarction) (HCC)   Unstable angina (HCC)    Plan: NSTEMI - he feels ready to proceed with cath and appears calm and cooperative today. Consent discussed and obtained as noted below.   INFORMED CONSENT: I have reviewed the risks, indications, and alternatives to cardiac catheterization, possible angioplasty, and stenting with the patient. Risks include but are not limited to bleeding, infection, vascular injury, stroke, myocardial infection, arrhythmia, kidney injury, radiation-related injury in the case of prolonged fluoroscopy use, emergency cardiac surgery, and death.  The patient understands the risks of serious complication is 1-2 in 1000 with diagnostic cardiac cath and 1-2% or less with angioplasty/stenting.   Alcohol abuse/withdrawl - the patient denies difficulties with alcohol abuse and does not have insight into why he became combative on 08/02/2019. We discussed this briefly and he appears to be in the pre-contemplative state of motivation/change.    Length of Stay:  LOS: 2 days   Parke PoissonGayatri A Tracie Dore, MD [facility] 11:20 AM  08/04/2019

## 2019-08-05 ENCOUNTER — Encounter (HOSPITAL_COMMUNITY): Payer: Self-pay | Admitting: Cardiovascular Disease

## 2019-08-09 NOTE — Discharge Summary (Signed)
Discharge Summary    Patient ID: Barry Gutierrez MRN: [id number]; DOB: 1961/08/06  Admit date: 07/31/2019 Discharge date: 08/09/2019  Primary Care Provider: Myles Lipps, MD  Primary Cardiologist: Parke Poisson, MD  Primary Electrophysiologist:  None   Discharge Diagnoses    Principal Problem:   NSTEMI (non-ST elevated myocardial infarction) [facility]) Active Problems:   Cigarette smoker   Essential hypertension   Alcohol abuse   Allergies No Known Allergies  Diagnostic Studies/Procedures    ECHO: 08/01/2019 1. Left ventricular ejection fraction, by visual estimation, is 60 to 65%. The left ventricle has normal function. There is mildly increased left ventricular hypertrophy. Probable mild hypokinesis of the basal to mid inferior wall.  2. Definity contrast agent was given IV to delineate the left ventricular endocardial borders.  3. Left ventricular diastolic parameters are consistent with Grade I diastolic dysfunction (impaired relaxation).  4. Global right ventricle has normal systolic function.The right ventricular size is normal. No increase in right ventricular wall thickness.  5. Left atrial size was normal.  6. Right atrial size was normal.  7. The mitral valve is normal in structure. No evidence of mitral valve regurgitation.  8. The tricuspid valve is normal in structure. Tricuspid valve regurgitation is trivial.  9. The aortic valve is normal in structure. Aortic valve regurgitation is not visualized. Mild aortic valve sclerosis without stenosis. 10. The pulmonic valve was grossly normal. Pulmonic valve regurgitation is trivial. _____________   History of Present Illness     Barry Gutierrez is a 58 y.o. male with a history of tobacco and EtOH use but no cardiac history.  He was admitted 11/27 with a non-STEMI  Hospital Course     Consultants: None  He was treated appropriately with aspirin, heparin.  He was having some mild pain and IV nitroglycerin was  added.  He was placed on high-dose statin and Coreg 6.25 mg twice daily.  Cardiac catheterization was discussed with the patient including the risks and benefits.  However, on 11/30 at 2:40 AM, Mr. Elenore Paddy after became agitated and combative.  He pulled out his IVs.  He was threatening the nurse tach and being generally aggressive.  [employer], security and [facility] were called to the bedside.  He was cursing at everybody.  He refused to sign AMA papers.  He pulled out his IVs and was escorted out by police and security.  Did the patient have an acute coronary syndrome (MI, NSTEMI, STEMI, etc) this admission?:  Yes                               AHA/ACC Clinical Performance & Quality Measures: 1. Aspirin prescribed? - No - Patient left AMA, escorted out by police 2. ADP Receptor Inhibitor (Plavix/Clopidogrel, Brilinta/Ticagrelor or Effient/Prasugrel) prescribed (includes medically managed patients)? - No - See above 3. Beta Blocker prescribed? - No - See above 4. High Intensity Statin (Lipitor 40-80mg  or Crestor 20-40mg ) prescribed? - No - See above 5. EF assessed during THIS hospitalization? - Yes 6. For EF <40%, was ACEI/ARB prescribed? - Not Applicable (EF >/= 40%) 7. For EF <40%, Aldosterone Antagonist (Spironolactone or Eplerenone) prescribed? - Not Applicable (EF >/= 40%) 8. Cardiac Rehab Phase II ordered (Included Medically managed Patients)? - No - See above   _____________  Discharge Vitals Blood pressure 125/69, pulse 95, temperature (!) 97.5 F (36.4 C), temperature source Oral, resp. rate 20, height 5\' 10"  (1.778 m), weight  93.3 kg, SpO2 95 %.  Filed Weights   08/01/19 0119  Weight: 93.3 kg    Labs & Radiologic Studies    CBC No results for input(s): WBC, NEUTROABS, HGB, HCT, MCV, PLT in the last 72 hours. Basic Metabolic Panel No results for input(s): NA, K, CL, CO2, GLUCOSE, BUN, CREATININE, CALCIUM, MG, PHOS in the last 72 hours. Liver Function Tests No results for input(s): AST,  ALT, ALKPHOS, BILITOT, PROT, ALBUMIN in the last 72 hours. No results for input(s): LIPASE, AMYLASE in the last 72 hours. High Sensitivity Troponin:   Recent Labs  Lab 07/31/19 2240 08/01/19 0154 08/01/19 0712 08/02/19 0601 08/02/19 0755  TROPONINIHS 728* 1,065* 1,257* 1,333* 2,329*    BNP Invalid input(s): POCBNP D-Dimer No results for input(s): DDIMER in the last 72 hours. Hemoglobin A1C No results for input(s): HGBA1C in the last 72 hours. Fasting Lipid Panel No results for input(s): CHOL, HDL, LDLCALC, TRIG, CHOLHDL, LDLDIRECT in the last 72 hours. Thyroid Function Tests No results for input(s): TSH, T4TOTAL, T3FREE, THYROIDAB in the last 72 hours.  Invalid input(s): FREET3 _____________  Dg Chest 2 View  Result Date: 08/02/2019 CLINICAL DATA:  Chest pain. EXAM: CHEST - 2 VIEW COMPARISON:  07/31/2019. FINDINGS: Mediastinum and hilar structures are normal. Heart size normal. Mild bibasilar subsegmental atelectasis. Tiny calcified nodule left upper lung most consistent with tiny granuloma. No interim change from prior exam. No pleural effusion or pneumothorax. No acute bony abnormality identified. IMPRESSION: Mild bibasilar subsegmental atelectasis. Exam otherwise unremarkable. Electronically Signed   By: Marcello Moores  Register   On: 08/02/2019 06:16   Dg Chest 2 View  Result Date: 07/31/2019 CLINICAL DATA:  Chest pain EXAM: CHEST - 2 VIEW COMPARISON:  Chest radiograph 11/12/2017 FINDINGS: Normal cardiac and mediastinal contours. No consolidative pulmonary opacities. No pleural effusion or pneumothorax. Thoracic spine degenerative changes. IMPRESSION: No acute cardiopulmonary process. Electronically Signed   By: Lovey Newcomer M.D.   On: 07/31/2019 21:08   Disposition   Pt is being discharged home today in good condition.  Follow-up Plans & Appointments       Discharge Medications   Allergies as of 08/02/2019   No Known Allergies     Medication List    ASK your doctor  about these medications   acetaminophen 325 MG tablet Commonly known as: TYLENOL Take 325-650 mg by mouth every 6 (six) hours as needed (for pain).   famotidine 20 MG tablet Commonly known as: Pepcid One at bedtime          Outstanding Labs/Studies   None  Duration of Discharge Encounter   Greater than 30 minutes including physician time.  Signed, Rosaria Ferries, PA-C 08/09/2019, 1:54 PM

## 2019-08-19 ENCOUNTER — Other Ambulatory Visit: Payer: Self-pay

## 2019-08-19 ENCOUNTER — Ambulatory Visit: Payer: Self-pay | Admitting: Physician Assistant

## 2019-08-19 ENCOUNTER — Encounter: Payer: Self-pay | Admitting: Cardiology

## 2019-08-19 ENCOUNTER — Ambulatory Visit (INDEPENDENT_AMBULATORY_CARE_PROVIDER_SITE_OTHER): Payer: Self-pay | Admitting: Cardiology

## 2019-08-19 VITALS — BP 100/68 | HR 74 | Temp 97.2°F | Ht 70.0 in | Wt 204.0 lb

## 2019-08-19 DIAGNOSIS — E785 Hyperlipidemia, unspecified: Secondary | ICD-10-CM

## 2019-08-19 DIAGNOSIS — I10 Essential (primary) hypertension: Secondary | ICD-10-CM

## 2019-08-19 DIAGNOSIS — F17210 Nicotine dependence, cigarettes, uncomplicated: Secondary | ICD-10-CM

## 2019-08-19 DIAGNOSIS — F10931 Alcohol use, unspecified with withdrawal delirium: Secondary | ICD-10-CM

## 2019-08-19 DIAGNOSIS — I2510 Atherosclerotic heart disease of native coronary artery without angina pectoris: Secondary | ICD-10-CM

## 2019-08-19 DIAGNOSIS — F10231 Alcohol dependence with withdrawal delirium: Secondary | ICD-10-CM

## 2019-08-19 DIAGNOSIS — I214 Non-ST elevation (NSTEMI) myocardial infarction: Secondary | ICD-10-CM

## 2019-08-19 MED ORDER — CARVEDILOL 6.25 MG PO TABS: 6.2500 mg | ORAL_TABLET | Freq: Two times a day (BID) | ORAL | 2 refills | Status: DC

## 2019-08-19 MED ORDER — ATORVASTATIN CALCIUM 80 MG PO TABS: 80.0000 mg | ORAL_TABLET | Freq: Every day | ORAL | 2 refills | Status: DC

## 2019-08-19 MED ORDER — NITROGLYCERIN 0.4 MG SL SUBL: 0.4000 mg | SUBLINGUAL_TABLET | SUBLINGUAL | 1 refills | Status: DC | PRN

## 2019-08-19 NOTE — Assessment & Plan Note (Signed)
LDL 120 Nov 2020-on Lipitor 80 mg

## 2019-08-19 NOTE — Progress Notes (Signed)
Cardiology Office Note:    Date:  08/19/2019   ID:  Barry Gutierrez, DOB 03-14-1961, MRN [id number]  PCP:  Patient, No Pcp Per  Cardiologist:  Parke PoissonGayatri A Acharya, MD  Electrophysiologist:  None   Referring MD: Myles LippsSantiago, Irma M, MD   Chief Complaint  Patient presents with  . Follow-up  Post hospital NSTEMI follow up  History of Present Illness:    Barry Gutierrez is a 58 y.o. male with no prior history of CAD, admitted through the ED 07/31/2019 with chest pain and ruled in for a NSTEMI.  Early in the morning 11/30 he became aggressive and combative and was escorted out of the hospital by security.  He returned in the afternoon 08/02/2019 with recurrent chest pain.  He was admitted started on ASA, statin, Heparin, and beta blocker.  He was brought to the cath lab 08/04/2019 but again became aggressive and combative in the cath lab.  He had to be admitted to ICU and sedated before cath could be done.  Cath revealed an occluded mRCA with Lt-Rt collaterals from the CFX and LAD.  His LVF was normal-60-65%- by echo. He was discharged 08/04/2019 and is in the office today for follow up.   Since discharge he has not had recurrent chest pain as he had on admission.  He has resumed smoking.  He is taking his medications.  Past Medical History:  Diagnosis Date  . Allergy     Past Surgical History:  Procedure Laterality Date  . LEFT HEART CATH AND CORONARY ANGIOGRAPHY N/A 08/04/2019   Procedure: LEFT HEART CATH AND CORONARY ANGIOGRAPHY;  Surgeon: Iran OuchArida, Muhammad A, MD;  Location: [facility] INVASIVE CV LAB;  Service: Cardiovascular;  Laterality: N/A;    Current Medications: Current Meds  Medication Sig  . acetaminophen (TYLENOL) 325 MG tablet Take 325-650 mg by mouth every 6 (six) hours as needed (for pain).  Marland Kitchen. aspirin EC 81 MG EC tablet Take 1 tablet (81 mg total) by mouth daily.  Marland Kitchen. atorvastatin (LIPITOR) 80 MG tablet Take 1 tablet (80 mg total) by mouth daily at 6 PM.  . carvedilol (COREG) 6.25  MG tablet Take 1 tablet (6.25 mg total) by mouth 2 (two) times daily with a meal.  . famotidine (PEPCID) 20 MG tablet One at bedtime (Patient taking differently: Take 20-80 mg by mouth 2 (two) times daily as needed for heartburn or indigestion. )  . furosemide (LASIX) 20 MG tablet Take 1 tablet (20 mg total) by mouth daily.  . nitroGLYCERIN (NITROSTAT) 0.4 MG SL tablet Place 1 tablet (0.4 mg total) under the tongue every 5 (five) minutes x 3 doses as needed for chest pain.  . [DISCONTINUED] atorvastatin (LIPITOR) 80 MG tablet Take 1 tablet (80 mg total) by mouth daily at 6 PM.  . [DISCONTINUED] carvedilol (COREG) 6.25 MG tablet Take 1 tablet (6.25 mg total) by mouth 2 (two) times daily with a meal.  . [DISCONTINUED] nitroGLYCERIN (NITROSTAT) 0.4 MG SL tablet Place 1 tablet (0.4 mg total) under the tongue every 5 (five) minutes x 3 doses as needed for chest pain.     Allergies:   Patient has no known allergies.   Social History   Socioeconomic History  . Marital status: Legally Separated    Spouse name: Not on file  . Number of children: Not on file  . Years of education: Not on file  . Highest education level: Not on file  Occupational History  . Not on file  Tobacco Use  .  Smoking status: Current Every Day Smoker    Packs/day: 2.00    Types: Cigarettes  . Smokeless tobacco: Never Used  Substance and Sexual Activity  . Alcohol use: Yes  . Drug use: Yes    Types: Marijuana    Comment: every other month or so   . Sexual activity: Not Currently  Other Topics Concern  . Not on file  Social History Narrative  . Not on file   Social Determinants of Health   Financial Resource Strain:   . Difficulty of Paying Living Expenses: Not on file  Food Insecurity:   . Worried About Programme researcher, broadcasting/film/video in the Last Year: Not on file  . Ran Out of Food in the Last Year: Not on file  Transportation Needs: No Transportation Needs  . Lack of Transportation (Medical): No  . Lack of  Transportation (Non-Medical): No  Physical Activity:   . Days of Exercise per Week: Not on file  . Minutes of Exercise per Session: Not on file  Stress:   . Feeling of Stress : Not on file  Social Connections:   . Frequency of Communication with Friends and Family: Not on file  . Frequency of Social Gatherings with Friends and Family: Not on file  . Attends Religious Services: Not on file  . Active Member of Clubs or Organizations: Not on file  . Attends Banker Meetings: Not on file  . Marital Status: Not on file     Family History: The patient's family history includes CAD in his father and mother; Cancer in his mother; Coronary artery disease in his father and mother; Diabetes in his father.  ROS:   Please see the history of present illness.     All other systems reviewed and are negative.  EKGs/Labs/Other Studies Reviewed:    The following studies were reviewed today: Cath 08/04/2019 Echo 08/02/2019  EKG:  EKG is not ordered today.  The ekg ordered 08/11/2019 today demonstrates NSR, NSST changes  Recent Labs: 07/31/2019: B Natriuretic Peptide 313.9 08/01/2019: Magnesium 2.1 08/02/2019: ALT 34 08/03/2019: BUN 9; Potassium 3.9; Sodium 137 08/04/2019: Creatinine, Ser 0.93; Hemoglobin 13.6; Platelets 128  Recent Lipid Panel    Component Value Date/Time   CHOL 212 (H) 08/01/2019 0154   TRIG 191 (H) 08/01/2019 0154   HDL 46 08/01/2019 0154   CHOLHDL 4.6 08/01/2019 0154   VLDL 38 08/01/2019 0154   LDLCALC 128 (H) 08/01/2019 0154    Physical Exam:    VS:  BP 100/68   Pulse 74   Temp (!) 97.2 F (36.2 C) (Temporal)   Ht 5\' 10"  (1.778 m)   Wt 204 lb (92.5 kg)   SpO2 96%   BMI 29.27 kg/m     Wt Readings from Last 3 Encounters:  08/19/19 204 lb (92.5 kg)  08/04/19 203 lb 7.8 oz (92.3 kg)  08/01/19 205 lb 11 oz (93.3 kg)     GEN:  Well nourished, well developed in no acute distress HEENT: Normal NECK: No JVD; No carotid bruits CARDIAC: RRR, no  murmurs, rubs, gallops RESPIRATORY:  Clear to auscultation without rales, wheezing or rhonchi  ABDOMEN: Soft, non-tender, non-distended MUSCULOSKELETAL:  No edema; No deformity  SKIN: Warm and dry NEUROLOGIC:  Alert and oriented x 3 PSYCHIATRIC:  Normal affect   ASSESSMENT:    NSTEMI (non-ST elevated myocardial infarction) (HCC) Admitted 08/02/2019- NSTEMI- medical Rx  CAD (coronary artery disease) Acute on chronic occlusion of RCA- L-R collaterals, EF 60-65%  Dyslipidemia,  goal LDL below 70 LDL 120 Nov 2020-on Lipitor 80 mg  Essential hypertension Controlled- Mild LVH, normal LVF  Alcohol withdrawal delirium (HCC) Pt had withdrawal and became combative requiring ICU placement and sedation prior to coronary angiogram 08/04/2019  Cigarette smoker Back to 1- 2 PPD  PLAN:    I reviewed his cath films and echo with him and discussed the plan for medical Rx.  I discussed the importance of smoking cessation.  His Lipitor and Coreg were refilled and I suggested a f/u in 3 months though he tells me he may be moving to the [place] area.    Medication Adjustments/Labs and Tests Ordered: Current medicines are reviewed at length with the patient today.  Concerns regarding medicines are outlined above.  No orders of the defined types were placed in this encounter.  Meds ordered this encounter  Medications  . carvedilol (COREG) 6.25 MG tablet    Sig: Take 1 tablet (6.25 mg total) by mouth 2 (two) times daily with a meal.    Dispense:  180 tablet    Refill:  2  . atorvastatin (LIPITOR) 80 MG tablet    Sig: Take 1 tablet (80 mg total) by mouth daily at 6 PM.    Dispense:  90 tablet    Refill:  2  . nitroGLYCERIN (NITROSTAT) 0.4 MG SL tablet    Sig: Place 1 tablet (0.4 mg total) under the tongue every 5 (five) minutes x 3 doses as needed for chest pain.    Dispense:  25 tablet    Refill:  1    Patient Instructions  Medication Instructions:  Your physician recommends that you  continue on your current medications as directed. Please refer to the Current Medication list given to you today. *If you need a refill on your cardiac medications before your next appointment, please call your pharmacy*  Lab Work: None  If you have labs (blood work) drawn today and your tests are completely normal, you will receive your results only by: Marland Kitchen MyChart Message (if you have MyChart) OR . A paper copy in the mail If you have any lab test that is abnormal or we need to change your treatment, we will call you to review the results.  Testing/Procedures: None   Follow-Up: At [facility], you and your health needs are our priority.  As part of our continuing mission to provide you with exceptional heart care, we have created designated Provider Care Teams.  These Care Teams include your primary Cardiologist (physician) and Advanced Practice Providers (APPs -  Physician Assistants and Nurse Practitioners) who all work together to provide you with the care you need, when you need it.  Your next appointment:   3 month(s)  The format for your next appointment:   In Person  Provider:   Cherlynn Kaiser, MD  Other Instructions     Signed, Kerin Ransom, PA-C  08/19/2019 9:37 AM    [place]

## 2019-08-19 NOTE — Patient Instructions (Signed)
Medication Instructions:  Your physician recommends that you continue on your current medications as directed. Please refer to the Current Medication list given to you today. *If you need a refill on your cardiac medications before your next appointment, please call your pharmacy*  Lab Work: None  If you have labs (blood work) drawn today and your tests are completely normal, you will receive your results only by: Marland Kitchen MyChart Message (if you have MyChart) OR . A paper copy in the mail If you have any lab test that is abnormal or we need to change your treatment, we will call you to review the results.  Testing/Procedures: None   Follow-Up: At [facility], you and your health needs are our priority.  As part of our continuing mission to provide you with exceptional heart care, we have created designated Provider Care Teams.  These Care Teams include your primary Cardiologist (physician) and Advanced Practice Providers (APPs -  Physician Assistants and Nurse Practitioners) who all work together to provide you with the care you need, when you need it.  Your next appointment:   3 month(s)  The format for your next appointment:   In Person  Provider:   Cherlynn Kaiser, MD  Other Instructions

## 2019-08-19 NOTE — Assessment & Plan Note (Signed)
Admitted 08/02/2019- NSTEMI- medical Rx

## 2019-08-19 NOTE — Assessment & Plan Note (Signed)
Pt had withdrawal and became combative requiring ICU placement and sedation prior to coronary angiogram 08/04/2019

## 2019-08-19 NOTE — Assessment & Plan Note (Signed)
Back to 1- 2 PPD

## 2019-08-19 NOTE — Assessment & Plan Note (Signed)
Acute on chronic occlusion of RCA- L-R collaterals, EF 60-65%

## 2019-08-19 NOTE — Assessment & Plan Note (Signed)
Controlled- Mild LVH, normal LVF

## 2019-09-21 ENCOUNTER — Emergency Department (HOSPITAL_COMMUNITY): Payer: HRSA Program

## 2019-09-21 ENCOUNTER — Emergency Department (HOSPITAL_COMMUNITY)
Admission: EM | Admit: 2019-09-21 | Discharge: 2019-09-21 | Disposition: A | Discharge status: Home / Self Care | Payer: HRSA Program | Attending: Emergency Medicine | Admitting: Emergency Medicine

## 2019-09-21 ENCOUNTER — Encounter (HOSPITAL_COMMUNITY): Payer: Self-pay | Admitting: Emergency Medicine

## 2019-09-21 ENCOUNTER — Other Ambulatory Visit: Payer: Self-pay

## 2019-09-21 DIAGNOSIS — R059 Cough, unspecified: Secondary | ICD-10-CM

## 2019-09-21 DIAGNOSIS — Z79899 Other long term (current) drug therapy: Secondary | ICD-10-CM | POA: Insufficient documentation

## 2019-09-21 DIAGNOSIS — F17210 Nicotine dependence, cigarettes, uncomplicated: Secondary | ICD-10-CM | POA: Insufficient documentation

## 2019-09-21 DIAGNOSIS — R0602 Shortness of breath: Secondary | ICD-10-CM

## 2019-09-21 DIAGNOSIS — I10 Essential (primary) hypertension: Secondary | ICD-10-CM | POA: Diagnosis not present

## 2019-09-21 DIAGNOSIS — U071 COVID-19: Secondary | ICD-10-CM | POA: Insufficient documentation

## 2019-09-21 DIAGNOSIS — F1210 Cannabis abuse, uncomplicated: Secondary | ICD-10-CM | POA: Diagnosis not present

## 2019-09-21 DIAGNOSIS — I2510 Atherosclerotic heart disease of native coronary artery without angina pectoris: Secondary | ICD-10-CM | POA: Diagnosis not present

## 2019-09-21 DIAGNOSIS — Z7982 Long term (current) use of aspirin: Secondary | ICD-10-CM | POA: Insufficient documentation

## 2019-09-21 DIAGNOSIS — R05 Cough: Secondary | ICD-10-CM

## 2019-09-21 DIAGNOSIS — Z20822 Contact with and (suspected) exposure to covid-19: Secondary | ICD-10-CM

## 2019-09-21 LAB — CBC WITH DIFFERENTIAL/PLATELET
Abs Immature Granulocytes: 0.01 K/uL (ref 0.00–0.07)
Basophils Absolute: 0 K/uL (ref 0.0–0.1)
Basophils Relative: 1 %
Eosinophils Absolute: 0.1 K/uL (ref 0.0–0.5)
Eosinophils Relative: 3 %
HCT: 46.8 % (ref 39.0–52.0)
Hemoglobin: 16.4 g/dL (ref 13.0–17.0)
Immature Granulocytes: 0 %
Lymphocytes Relative: 24 %
Lymphs Abs: 1 K/uL (ref 0.7–4.0)
MCH: 32.8 pg (ref 26.0–34.0)
MCHC: 35 g/dL (ref 30.0–36.0)
MCV: 93.6 fL (ref 80.0–100.0)
Monocytes Absolute: 0.5 K/uL (ref 0.1–1.0)
Monocytes Relative: 11 %
Neutro Abs: 2.6 K/uL (ref 1.7–7.7)
Neutrophils Relative %: 61 %
Platelets: 147 K/uL — ABNORMAL LOW (ref 150–400)
RBC: 5 MIL/uL (ref 4.22–5.81)
RDW: 12 % (ref 11.5–15.5)
WBC: 4.2 K/uL (ref 4.0–10.5)
nRBC: 0 % (ref 0.0–0.2)

## 2019-09-21 LAB — COMPREHENSIVE METABOLIC PANEL
ALT: 39 U/L (ref 0–44)
AST: 44 U/L — ABNORMAL HIGH (ref 15–41)
Albumin: 3.8 g/dL (ref 3.5–5.0)
Alkaline Phosphatase: 87 U/L (ref 38–126)
Anion gap: 9 (ref 5–15)
BUN: 6 mg/dL (ref 6–20)
CO2: 25 mmol/L (ref 22–32)
Calcium: 9 mg/dL (ref 8.9–10.3)
Chloride: 105 mmol/L (ref 98–111)
Creatinine, Ser: 0.85 mg/dL (ref 0.61–1.24)
GFR calc Af Amer: >60 mL/min (ref >60)
GFR calc non Af Amer: >60 mL/min (ref >60)
Glucose, Bld: 103 mg/dL — ABNORMAL HIGH (ref 70–99)
Potassium: 3.5 mmol/L (ref 3.5–5.1)
Sodium: 139 mmol/L (ref 135–145)
Total Bilirubin: 1.6 mg/dL — ABNORMAL HIGH (ref 0.3–1.2)
Total Protein: 7.1 g/dL (ref 6.5–8.1)

## 2019-09-21 LAB — POC SARS CORONAVIRUS 2 AG -  ED: SARS Coronavirus 2 Ag: NEGATIVE

## 2019-09-21 LAB — SARS CORONAVIRUS 2 (TAT 6-24 HRS): SARS Coronavirus 2: POSITIVE — AB

## 2019-09-21 MED ORDER — ALBUTEROL SULFATE HFA 108 (90 BASE) MCG/ACT IN AERS
4.0000 | INHALATION_SPRAY | Freq: Once | RESPIRATORY_TRACT | Status: AC
  Administered 2019-09-21: 4 via RESPIRATORY_TRACT
  Filled 2019-09-21: qty 6.7

## 2019-09-21 NOTE — ED Triage Notes (Signed)
C/o SOB with exertion, fever, chills, body aches, diarrhea, and fatigue x 1 1/2 weeks.  No known COVID exposures.

## 2019-09-21 NOTE — ED Notes (Signed)
Pt able to ambulate with a pulse ox of 96% and heart rate of 84. Nurse notified

## 2019-09-21 NOTE — Discharge Instructions (Addendum)
Your xray today was normal. Your laboratory results were within normal limits.   You were swabbed for Covid 19 infection, these results will be called to you in the next 24 hours.  Continue to use the inhaler to help with your symptoms, if your symptoms worsen, you experience chest pain, worsening shortness of breath please return to the emergency department.

## 2019-09-21 NOTE — ED Provider Notes (Signed)
[name] EMERGENCY DEPARTMENT Provider Note   CSN: [id number] Arrival date & time: 09/21/19  1028     History Chief Complaint  Patient presents with  . COVID testing  . Shortness of Breath  . Fever    Barry Gutierrez is a 59 y.o. male.  59 y.o male with a  PMH of CAD cardiac cath on 08/04/2019, Alcohol abuse presents to the ED with a chief complaint of chills, body aches x 1 week and a half.  Patient reports he had a cardiac cath at the beginning of December, since this began he has not felt well.  He reports he is got body aches, fatigue, shortness of breath with exertion, lack of appetite.  He does feel like he is unable to breathe when he wears his mask.  He states upon attempting to eat, he feels like everything tastes funny.  He reports he has had an increase amount of sleep, slept for 24 hours yesterday.  He describes a subjective fever, unknown as patient does not have a thermometer.  He also describes a slight chest pain, this is worse with breathing.  He has tried some Mucinex, DM cough syrup, reports no improvement in his symptoms.  Patient is currently employed as a Human resources officer, has been missing work for the past week and a half.  He denies any other known contacts, abdominal pain, urinary symptoms, diaphoresis.  The history is provided by the patient and medical records.  Shortness of Breath Associated symptoms: chest pain and fever   Associated symptoms: no abdominal pain, no headaches, no sore throat and no vomiting   Fever Associated symptoms: chest pain and myalgias   Associated symptoms: no diarrhea, no headaches, no nausea, no sore throat and no vomiting        Past Medical History:  Diagnosis Date  . Allergy     Patient Active Problem List   Diagnosis Date Noted  . Alcohol withdrawal delirium (HCC) 08/04/2019  . Dyslipidemia, goal LDL below 70 08/04/2019  . CAD (coronary artery disease) 08/04/2019  . Unstable angina (HCC)  08/03/2019  . Essential hypertension   . Alcohol abuse   . NSTEMI (non-ST elevated myocardial infarction) (HCC) 07/31/2019  . Diverticulosis of colon 02/05/2018  . Upper airway cough syndrome 11/13/2017  . Cigarette smoker 11/13/2017  . Asthmatic bronchitis with acute exacerbation 11/12/2017    Past Surgical History:  Procedure Laterality Date  . LEFT HEART CATH AND CORONARY ANGIOGRAPHY N/A 08/04/2019   Procedure: LEFT HEART CATH AND CORONARY ANGIOGRAPHY;  Surgeon: Iran Ouch, MD;  Location: [facility] INVASIVE CV LAB;  Service: Cardiovascular;  Laterality: N/A;       Family History  Problem Relation Age of Onset  . Cancer Mother   . CAD Mother   . Coronary artery disease Mother   . Diabetes Father   . CAD Father   . Coronary artery disease Father     Social History   Tobacco Use  . Smoking status: Current Every Day Smoker    Packs/day: 2.00    Types: Cigarettes  . Smokeless tobacco: Never Used  Substance Use Topics  . Alcohol use: Yes  . Drug use: Yes    Types: Marijuana    Comment: every other month or so     Home Medications Prior to Admission medications   Medication Sig Start Date End Date Taking? Authorizing Provider  acetaminophen (TYLENOL) 325 MG tablet Take 325-650 mg by mouth every 6 (six) hours as  needed (for pain).    [provider]  aspirin EC 81 MG EC tablet Take 1 tablet (81 mg total) by mouth daily. 08/05/19   Furth, Cadence H, PA-C  atorvastatin (LIPITOR) 80 MG tablet Take 1 tablet (80 mg total) by mouth daily at 6 PM. 08/19/19   Kilroy, Eda Paschal, PA-C  carvedilol (COREG) 6.25 MG tablet Take 1 tablet (6.25 mg total) by mouth 2 (two) times daily with a meal. 08/19/19   Kilroy, Eda Paschal, PA-C  famotidine (PEPCID) 20 MG tablet One at bedtime Patient taking differently: Take 20-80 mg by mouth 2 (two) times daily as needed for heartburn or indigestion.  11/12/17   Nyoka Cowden, MD  furosemide (LASIX) 20 MG tablet Take 1 tablet (20 mg total) by mouth  daily. 08/05/19   Furth, Cadence H, PA-C  nitroGLYCERIN (NITROSTAT) 0.4 MG SL tablet Place 1 tablet (0.4 mg total) under the tongue every 5 (five) minutes x 3 doses as needed for chest pain. 08/19/19   Abelino Derrick, PA-C    Allergies    Patient has no known allergies.  Review of Systems   Review of Systems  Constitutional: Positive for fever.  HENT: Negative for sore throat.   Respiratory: Positive for shortness of breath.   Cardiovascular: Positive for chest pain.  Gastrointestinal: Negative for abdominal pain, diarrhea, nausea and vomiting.  Genitourinary: Negative for flank pain.  Musculoskeletal: Positive for myalgias.  Neurological: Negative for light-headedness and headaches.    Physical Exam Updated Vital Signs BP 125/81   Pulse 71   Temp 98.9 F (37.2 C) (Oral)   Resp 18   SpO2 95%   Physical Exam Vitals and nursing note reviewed.  Constitutional:      Appearance: He is well-developed. He is ill-appearing.  HENT:     Head: Normocephalic and atraumatic.  Eyes:     General: No scleral icterus.    Pupils: Pupils are equal, round, and reactive to light.  Cardiovascular:     Rate and Rhythm: Normal rate.     Heart sounds: Normal heart sounds.     Comments: NO pitting edema BL, no calf tenderness.  Pulmonary:     Effort: Pulmonary effort is normal. No tachypnea.     Breath sounds: Examination of the right-upper field reveals rhonchi. Examination of the left-upper field reveals rhonchi. Decreased breath sounds and rhonchi present. No wheezing.  Chest:     Chest wall: No mass or tenderness.  Abdominal:     General: Bowel sounds are normal. There is no distension.     Palpations: Abdomen is soft.     Tenderness: There is no abdominal tenderness.  Musculoskeletal:        General: No tenderness or deformity.     Cervical back: Normal range of motion.     Right lower leg: No tenderness. No edema.     Left lower leg: No tenderness. No edema.  Skin:    General: Skin  is warm and dry.  Neurological:     Mental Status: He is alert and oriented to person, place, and time.     ED Results / Procedures / Treatments   Labs (all labs ordered are listed, but only abnormal results are displayed) Labs Reviewed  CBC WITH DIFFERENTIAL/PLATELET - Abnormal; Notable for the following components:      Result Value   Platelets 147 (*)    All other components within normal limits  COMPREHENSIVE METABOLIC PANEL - Abnormal; Notable for the following  components:   Glucose, Bld 103 (*)    AST 44 (*)    Total Bilirubin 1.6 (*)    All other components within normal limits  SARS CORONAVIRUS 2 (TAT 6-24 HRS)  CULTURE, BLOOD (ROUTINE X 2)  POC SARS CORONAVIRUS 2 AG -  ED    EKG None  Radiology DG Chest Portable 1 View  Result Date: 09/21/2019 CLINICAL DATA:  COVID.  Body aches.  Shortness of breath.  Cough. EXAM: PORTABLE CHEST 1 VIEW COMPARISON:  08/02/2019. FINDINGS: Heart size normal. No focal infiltrate. Mild bibasilar subsegmental atelectasis. No pleural effusion or pneumothorax. Degenerative change thoracic spine. IMPRESSION: Mild bibasilar subsegmental atelectasis. No acute infiltrate noted. Chest is stable from prior study of 08/02/2019. Electronically Signed   By: Marcello Moores  Register   On: 09/21/2019 11:35    Procedures Procedures (including critical care time)  Medications Ordered in ED Medications  albuterol (VENTOLIN HFA) 108 (90 Base) MCG/ACT inhaler 4 puff (4 puffs Inhalation Given 09/21/19 1407)    ED Course  I have reviewed the triage vital signs and the nursing notes.  Pertinent labs & imaging results that were available during my care of the patient were reviewed by me and considered in my medical decision making (see chart for details).  Clinical Course as of Sep 20 1445  Tue Sep 21, 2019  1129 SARS Coronavirus 2 Ag: NEGATIVE [JS]    Clinical Course User Index [JS] Janeece Fitting, PA-C   MDM Rules/Calculators/A&P   Patient with a recent  history of cardiac cath placed on 08/04/2019 presents to the ED with complaints of body aches, myalgias, fevers, is been experiencing this for the past week and a half, feels that he is got a productive to dry cough, has taken Mucinex, Robitussin, cough suppressant without improvement in his symptoms.  He feels overall unwell, does feel short of breath with exertion.  Patient does report smoking periods, has cut down to 1 pack a day.  He does have a dry cough, reports this is like his smoker's cough, there is rhonchi present on his lung exam on the upper airways.  Has felt warm however unknown if whether he has had a fever or not.  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  Chest xray: Mild bibasilar subsegmental atelectasis. No acute infiltrate noted.  Chest is stable from prior study of 08/02/2019.     CBC without any leukocytosis, hemoglobin is within normal limits.  CMP without any electrolyte normality, LFTs are slightly elevated, he does have a history of alcohol abuse.  Rapid Covid test today was negative, will obtain a 6 to 24-hour for better specificity.  She was ambulated in the room, maintaining his oxygen above 90% on room air, with an oxygen saturation of 96%, heart rate of 84.  I discussed with patient that he is likely suffering from COVID-19 infection although his test came back negative.  His laboratory results have been discussed with him at length, he was advised to isolate for the next 14 days.  He will be asked to follow-up with PCP after completion of his quarantine..  Patient was also encouraged to smoke cessation.  Patient understands and agrees with management, return precautions discussed at length.    Portions of this note were generated with Lobbyist. Dictation errors may occur despite  best attempts at proofreading.  Final Clinical Impression(s) / ED Diagnoses Final diagnoses:  Suspected COVID-19 virus infection  Shortness of breath  Cough    Rx / DC Orders ED Discharge Orders    None       Claude Manges, PA-C 09/21/19 1448    Arby Barrette, MD 09/23/19 1044

## 2019-09-26 LAB — CULTURE, BLOOD (ROUTINE X 2)
Culture: NO GROWTH
Special Requests: ADEQUATE

## 2019-09-30 ENCOUNTER — Telehealth: Payer: Self-pay

## 2019-10-06 NOTE — Telephone Encounter (Signed)
Note not needed 

## 2019-10-30 ENCOUNTER — Emergency Department (HOSPITAL_COMMUNITY): Payer: Self-pay

## 2019-10-30 ENCOUNTER — Other Ambulatory Visit: Payer: Self-pay

## 2019-10-30 ENCOUNTER — Emergency Department (HOSPITAL_COMMUNITY)
Admission: EM | Admit: 2019-10-30 | Discharge: 2019-10-31 | Disposition: A | Discharge status: Home / Self Care | Payer: Self-pay | Attending: Emergency Medicine | Admitting: Emergency Medicine

## 2019-10-30 DIAGNOSIS — T40604A Poisoning by unspecified narcotics, undetermined, initial encounter: Secondary | ICD-10-CM

## 2019-10-30 DIAGNOSIS — T402X4A Poisoning by other opioids, undetermined, initial encounter: Secondary | ICD-10-CM | POA: Insufficient documentation

## 2019-10-30 DIAGNOSIS — I10 Essential (primary) hypertension: Secondary | ICD-10-CM | POA: Insufficient documentation

## 2019-10-30 DIAGNOSIS — I2510 Atherosclerotic heart disease of native coronary artery without angina pectoris: Secondary | ICD-10-CM | POA: Insufficient documentation

## 2019-10-30 DIAGNOSIS — I252 Old myocardial infarction: Secondary | ICD-10-CM | POA: Insufficient documentation

## 2019-10-30 DIAGNOSIS — F17210 Nicotine dependence, cigarettes, uncomplicated: Secondary | ICD-10-CM | POA: Insufficient documentation

## 2019-10-30 DIAGNOSIS — J45909 Unspecified asthma, uncomplicated: Secondary | ICD-10-CM | POA: Insufficient documentation

## 2019-10-30 DIAGNOSIS — R0902 Hypoxemia: Secondary | ICD-10-CM | POA: Insufficient documentation

## 2019-10-30 DIAGNOSIS — F10920 Alcohol use, unspecified with intoxication, uncomplicated: Secondary | ICD-10-CM | POA: Insufficient documentation

## 2019-10-30 LAB — COMPREHENSIVE METABOLIC PANEL
ALT: 33 U/L (ref 0–44)
AST: 54 U/L — ABNORMAL HIGH (ref 15–41)
Albumin: 3.8 g/dL (ref 3.5–5.0)
Alkaline Phosphatase: 70 U/L (ref 38–126)
Anion gap: 16 — ABNORMAL HIGH (ref 5–15)
BUN: 12 mg/dL (ref 6–20)
CO2: 20 mmol/L — ABNORMAL LOW (ref 22–32)
Calcium: 8.4 mg/dL — ABNORMAL LOW (ref 8.9–10.3)
Chloride: 102 mmol/L (ref 98–111)
Creatinine, Ser: 1.05 mg/dL (ref 0.61–1.24)
GFR calc Af Amer: >60 mL/min (ref >60)
GFR calc non Af Amer: >60 mL/min (ref >60)
Glucose, Bld: 188 mg/dL — ABNORMAL HIGH (ref 70–99)
Potassium: 3.7 mmol/L (ref 3.5–5.1)
Sodium: 138 mmol/L (ref 135–145)
Total Bilirubin: 1.6 mg/dL — ABNORMAL HIGH (ref 0.3–1.2)
Total Protein: 6.4 g/dL — ABNORMAL LOW (ref 6.5–8.1)

## 2019-10-30 LAB — CBC WITH DIFFERENTIAL/PLATELET
Abs Immature Granulocytes: 0.03 10*3/uL (ref 0.00–0.07)
Basophils Absolute: 0.1 10*3/uL (ref 0.0–0.1)
Basophils Relative: 1 %
Eosinophils Absolute: 0.4 10*3/uL (ref 0.0–0.5)
Eosinophils Relative: 5 %
HCT: 46.6 % (ref 39.0–52.0)
Hemoglobin: 15.7 g/dL (ref 13.0–17.0)
Immature Granulocytes: 0 %
Lymphocytes Relative: 48 %
Lymphs Abs: 3.8 10*3/uL (ref 0.7–4.0)
MCH: 32.3 pg (ref 26.0–34.0)
MCHC: 33.7 g/dL (ref 30.0–36.0)
MCV: 95.9 fL (ref 80.0–100.0)
Monocytes Absolute: 0.6 10*3/uL (ref 0.1–1.0)
Monocytes Relative: 8 %
Neutro Abs: 3 10*3/uL (ref 1.7–7.7)
Neutrophils Relative %: 38 %
Platelets: 224 10*3/uL (ref 150–400)
RBC: 4.86 MIL/uL (ref 4.22–5.81)
RDW: 13.3 % (ref 11.5–15.5)
WBC: 8 10*3/uL (ref 4.0–10.5)
nRBC: 0 % (ref 0.0–0.2)

## 2019-10-30 LAB — I-STAT CHEM 8, ED
BUN: 12 mg/dL (ref 6–20)
Calcium, Ion: 1.09 mmol/L — ABNORMAL LOW (ref 1.15–1.40)
Chloride: 103 mmol/L (ref 98–111)
Creatinine, Ser: 1.2 mg/dL (ref 0.61–1.24)
Glucose, Bld: 177 mg/dL — ABNORMAL HIGH (ref 70–99)
HCT: 46 % (ref 39.0–52.0)
Hemoglobin: 15.6 g/dL (ref 13.0–17.0)
Potassium: 3.7 mmol/L (ref 3.5–5.1)
Sodium: 139 mmol/L (ref 135–145)
TCO2: 24 mmol/L (ref 22–32)

## 2019-10-30 LAB — CBG MONITORING, ED: Glucose-Capillary: 127 mg/dL — ABNORMAL HIGH (ref 70–99)

## 2019-10-30 LAB — SALICYLATE LEVEL: Salicylate Lvl: <7 mg/dL — ABNORMAL LOW (ref 7.0–30.0)

## 2019-10-30 LAB — ETHANOL: Alcohol, Ethyl (B): 309 mg/dL (ref <10)

## 2019-10-30 LAB — ACETAMINOPHEN LEVEL: Acetaminophen (Tylenol), Serum: <10 ug/mL — ABNORMAL LOW (ref 10–30)

## 2019-10-30 MED ORDER — SODIUM CHLORIDE 0.9 % IV SOLN
INTRAVENOUS | Status: DC
  Administered 2019-10-30: 1000 mL via INTRAVENOUS

## 2019-10-30 MED ORDER — NALOXONE HCL 2 MG/2ML IJ SOSY
1.0000 mg | PREFILLED_SYRINGE | Freq: Once | INTRAMUSCULAR | Status: AC
  Administered 2019-10-30: 1 mg via INTRAVENOUS

## 2019-10-30 MED ORDER — NALOXONE HCL 2 MG/2ML IJ SOSY
PREFILLED_SYRINGE | INTRAMUSCULAR | Status: AC
  Administered 2019-10-30: 1 mg via INTRAVENOUS
  Filled 2019-10-30: qty 2

## 2019-10-30 MED ORDER — SODIUM CHLORIDE 0.9 % IV BOLUS
1000.0000 mL | Freq: Once | INTRAVENOUS | Status: AC
  Administered 2019-10-30: 1000 mL via INTRAVENOUS

## 2019-10-30 MED ORDER — NALOXONE HCL 0.4 MG/ML IJ SOLN: 0.4000 mg | Freq: Once | INTRAMUSCULAR | Status: DC

## 2019-10-30 MED ORDER — NALOXONE HCL 2 MG/2ML IJ SOSY: 1.0000 mg | PREFILLED_SYRINGE | Freq: Once | INTRAMUSCULAR | Status: AC

## 2019-10-30 MED ORDER — KETAMINE HCL 50 MG/ML IJ SOLN
2.0000 mg/kg | Freq: Once | INTRAMUSCULAR | Status: AC
  Administered 2019-10-30: 04:00:00 170 mg via INTRAMUSCULAR
  Filled 2019-10-30: qty 10

## 2019-10-30 MED ORDER — SODIUM CHLORIDE 0.9 % IV BOLUS: 1000.0000 mL | Freq: Once | INTRAVENOUS | Status: DC

## 2019-10-30 NOTE — ED Notes (Signed)
Pt. cussing and saying, he's leaving. He stated, he hasn't done anything wrong, that he is not intoxicated, and he doesn't care, he is leaving. Nurse tried to calm patient and tell him why it is not in his best interest to leave be he said he didn't care and is he's still resisting. MD notified.
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
## 2019-10-30 NOTE — ED Notes (Addendum)
Pt. resting, redirected and water provided. Vitals are stable.
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
## 2019-10-30 NOTE — ED Notes (Signed)
Patient has been more easily arousable after each dose of Narcan. Pt will remain on the NRB until he is more alert/awake. Will continue to monitor.
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
## 2019-11-14 ENCOUNTER — Emergency Department (HOSPITAL_COMMUNITY)
Admission: EM | Admit: 2019-11-14 | Discharge: 2019-11-14 | Disposition: A | Discharge status: Home / Self Care | Payer: Self-pay | Attending: Emergency Medicine | Admitting: Emergency Medicine

## 2019-11-14 ENCOUNTER — Other Ambulatory Visit: Payer: Self-pay

## 2019-11-14 ENCOUNTER — Encounter (HOSPITAL_COMMUNITY): Payer: Self-pay | Admitting: Emergency Medicine

## 2019-11-14 DIAGNOSIS — Z79899 Other long term (current) drug therapy: Secondary | ICD-10-CM | POA: Insufficient documentation

## 2019-11-14 DIAGNOSIS — F17210 Nicotine dependence, cigarettes, uncomplicated: Secondary | ICD-10-CM | POA: Insufficient documentation

## 2019-11-14 DIAGNOSIS — I2510 Atherosclerotic heart disease of native coronary artery without angina pectoris: Secondary | ICD-10-CM | POA: Insufficient documentation

## 2019-11-14 DIAGNOSIS — Z7689 Persons encountering health services in other specified circumstances: Secondary | ICD-10-CM | POA: Insufficient documentation

## 2019-11-14 DIAGNOSIS — Z7982 Long term (current) use of aspirin: Secondary | ICD-10-CM | POA: Insufficient documentation

## 2019-11-14 DIAGNOSIS — I10 Essential (primary) hypertension: Secondary | ICD-10-CM | POA: Insufficient documentation

## 2019-11-14 NOTE — Discharge Instructions (Addendum)
Please get established with a primary care provider for ongoing evaluation and management of your health and wellbeing as soon as you are able.  Please strongly consider tobacco cessation, per our discussion.  Return to the ED or seek immediate medical attention she develop any new or worsening symptoms.

## 2019-11-14 NOTE — ED Triage Notes (Signed)
Pt. Presents from home for medical evaluation/clearance to return back to work following last hospital visit on 10/30/19. Pt. Denies any pain/discomfort, no weakness or dizziness.
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
## 2020-05-08 ENCOUNTER — Other Ambulatory Visit: Payer: Self-pay | Admitting: Cardiology

## 2020-06-09 ENCOUNTER — Other Ambulatory Visit: Payer: Self-pay

## 2020-06-09 ENCOUNTER — Emergency Department (HOSPITAL_COMMUNITY): Payer: Self-pay

## 2020-06-09 ENCOUNTER — Encounter (HOSPITAL_COMMUNITY): Payer: Self-pay

## 2020-06-09 ENCOUNTER — Emergency Department (HOSPITAL_COMMUNITY)
Admission: EM | Admit: 2020-06-09 | Discharge: 2020-06-09 | Disposition: A | Discharge status: Home / Self Care | Payer: Self-pay | Attending: Emergency Medicine | Admitting: Emergency Medicine

## 2020-06-09 DIAGNOSIS — I25110 Atherosclerotic heart disease of native coronary artery with unstable angina pectoris: Secondary | ICD-10-CM | POA: Insufficient documentation

## 2020-06-09 DIAGNOSIS — Z7982 Long term (current) use of aspirin: Secondary | ICD-10-CM | POA: Insufficient documentation

## 2020-06-09 DIAGNOSIS — Z79899 Other long term (current) drug therapy: Secondary | ICD-10-CM | POA: Insufficient documentation

## 2020-06-09 DIAGNOSIS — F17210 Nicotine dependence, cigarettes, uncomplicated: Secondary | ICD-10-CM | POA: Insufficient documentation

## 2020-06-09 DIAGNOSIS — R079 Chest pain, unspecified: Secondary | ICD-10-CM | POA: Insufficient documentation

## 2020-06-09 DIAGNOSIS — R42 Dizziness and giddiness: Secondary | ICD-10-CM | POA: Insufficient documentation

## 2020-06-09 DIAGNOSIS — R519 Headache, unspecified: Secondary | ICD-10-CM | POA: Insufficient documentation

## 2020-06-09 DIAGNOSIS — R5383 Other fatigue: Secondary | ICD-10-CM | POA: Insufficient documentation

## 2020-06-09 DIAGNOSIS — Z955 Presence of coronary angioplasty implant and graft: Secondary | ICD-10-CM | POA: Insufficient documentation

## 2020-06-09 DIAGNOSIS — I10 Essential (primary) hypertension: Secondary | ICD-10-CM | POA: Insufficient documentation

## 2020-06-09 HISTORY — DX: Acute myocardial infarction, unspecified: I21.9

## 2020-06-09 LAB — TROPONIN I (HIGH SENSITIVITY)
Troponin I (High Sensitivity): 6 ng/L (ref <18)
Troponin I (High Sensitivity): 8 ng/L (ref <18)

## 2020-06-09 LAB — CBC
HCT: 41.8 % (ref 39.0–52.0)
Hemoglobin: 14 g/dL (ref 13.0–17.0)
MCH: 31.5 pg (ref 26.0–34.0)
MCHC: 33.5 g/dL (ref 30.0–36.0)
MCV: 93.9 fL (ref 80.0–100.0)
Platelets: 157 10*3/uL (ref 150–400)
RBC: 4.45 MIL/uL (ref 4.22–5.81)
RDW: 12.3 % (ref 11.5–15.5)
WBC: 5.2 10*3/uL (ref 4.0–10.5)
nRBC: 0 % (ref 0.0–0.2)

## 2020-06-09 LAB — BASIC METABOLIC PANEL
Anion gap: 12 (ref 5–15)
BUN: 10 mg/dL (ref 6–20)
CO2: 24 mmol/L (ref 22–32)
Calcium: 9 mg/dL (ref 8.9–10.3)
Chloride: 105 mmol/L (ref 98–111)
Creatinine, Ser: 0.82 mg/dL (ref 0.61–1.24)
GFR, Estimated: >60 mL/min (ref >60)
Glucose, Bld: 102 mg/dL — ABNORMAL HIGH (ref 70–99)
Potassium: 4.1 mmol/L (ref 3.5–5.1)
Sodium: 141 mmol/L (ref 135–145)

## 2020-06-09 MED ORDER — MECLIZINE HCL 25 MG PO TABS
25.0000 mg | ORAL_TABLET | Freq: Once | ORAL | Status: AC
  Administered 2020-06-09: 25 mg via ORAL
  Filled 2020-06-09: qty 1

## 2020-06-09 MED ORDER — MECLIZINE HCL 25 MG PO TABS: 25.0000 mg | ORAL_TABLET | Freq: Three times a day (TID) | ORAL | 0 refills | Status: DC | PRN

## 2020-06-09 NOTE — ED Notes (Signed)
Pt transported to CT ?

## 2020-06-09 NOTE — ED Triage Notes (Signed)
Pt arrives to ED w/ c/o intermittent 4/10 chest pain, dizziness, and headache that has been going on since November of 2020 but has worsened over the last 2 months. Pt states he had MI in November of 2020. Pt states dizziness is worse when he stands up. Pt started on beta blocker after MI.

## 2020-06-09 NOTE — ED Notes (Signed)
To ct

## 2020-06-09 NOTE — ED Provider Notes (Signed)
[name] EMERGENCY DEPARTMENT Provider Note   CSN: [id number] Arrival date & time: 06/09/20  1117    History Chief Complaint  Patient presents with  . Chest Pain    Barry Gutierrez is a 59 y.o. male with past medical history significant for alcohol use, CAD, hypertension who presents for evaluation of multiple complaints.   1- Patient states he has had chronic chest pain since he had his MI in 2020.  Described as a "odd sensation in my chest."  Has not followed with cardiology since discharged as he states he is not insured. 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  He does not have any increased chest pain when he exerts himself.  No radiation to left arm, left jaw or back.  No associated diaphoresis, nausea, vomiting, lower extremity swelling, shortness of breath, lower extremity edema, erythema or warmth.  No recent surgery or immobilization.  No recent malignancy, no prior history of PE or DVT  2- Patient also states he has dizziness which is worse with movement. Described as room spinning.This began when he started the beta-blockers after his MI.  Has not noticed any gait abnormalities.  Symptoms improve if he does not move.  Feels lightheaded when he goes from sitting to standing.  3-Patient states she just feels generalized fatigue over the last year.  Began after taking the beta-blockers.  Has a chronic cough.  He recently stopped using tobacco.  Patient states it feels like everything "tastes funny."  He is vaccinated against Covid.    4-Has had headache to left side of his head which has been going on since November of last year.  Describes as a dull aching.  Not been trying anything at home for pain.  No sudden onset nature.  No associated vision changes, paresthesias, weakness, visual field cuts, slurred speech, difficulty with word finding.    Denies additional aggravating or alleviating factors.    History obtained from patient and past  medical records.  No interpreter is used.  HPI     Past Medical History:  Diagnosis Date  . Allergy   . MI (myocardial infarction) [facility])     Patient Active Problem List   Diagnosis Date Noted  . Alcohol withdrawal delirium (HCC) 08/04/2019  . Dyslipidemia, goal LDL below 70 08/04/2019  . CAD (coronary artery disease) 08/04/2019  . Unstable angina (HCC) 08/03/2019  . Essential hypertension   . Alcohol abuse   . NSTEMI (non-ST elevated myocardial infarction) (HCC) 07/31/2019  . Diverticulosis of colon 02/05/2018  . Upper airway cough syndrome 11/13/2017  . Cigarette smoker 11/13/2017  . Asthmatic bronchitis with acute exacerbation 11/12/2017    Past Surgical History:  Procedure Laterality Date  . LEFT HEART CATH AND CORONARY ANGIOGRAPHY N/A 08/04/2019   Procedure: LEFT HEART CATH AND CORONARY ANGIOGRAPHY;  Surgeon: Iran Ouch, MD;  Location: [facility] INVASIVE CV LAB;  Service: Cardiovascular;  Laterality: N/A;       Family History  Problem Relation Age of Onset  . Cancer Mother   . CAD Mother   . Coronary artery disease Mother   . Diabetes Father   . CAD Father   . Coronary artery disease Father     Social History   Tobacco Use  . Smoking status: Current Every Day Smoker    Packs/day: 2.00    Types: Cigarettes  . Smokeless tobacco: Never Used  Substance Use Topics  . Alcohol use: Yes  . Drug use: Yes  Types: Marijuana    Comment: every other month or so     Home Medications Prior to Admission medications   Medication Sig Start Date End Date Taking? Authorizing Provider  acetaminophen (TYLENOL) 325 MG tablet Take 325-650 mg by mouth every 6 (six) hours as needed (for pain).    [provider]  aspirin EC 81 MG EC tablet Take 1 tablet (81 mg total) by mouth daily. 08/05/19   Furth, Cadence H, PA-C  atorvastatin (LIPITOR) 80 MG tablet TAKE 1 TABLET(80 MG) BY MOUTH DAILY AT 6 PM 05/09/20   Parke Poisson, MD  carvedilol (COREG) 6.25 MG tablet  Take 1 tablet (6.25 mg total) by mouth 2 (two) times daily with a meal. 08/19/19   Kilroy, Eda Paschal, PA-C  famotidine (PEPCID) 20 MG tablet One at bedtime Patient taking differently: Take 20-80 mg by mouth 2 (two) times daily as needed for heartburn or indigestion.  11/12/17   Nyoka Cowden, MD  furosemide (LASIX) 20 MG tablet Take 1 tablet (20 mg total) by mouth daily. 08/05/19   Furth, Cadence H, PA-C  meclizine (ANTIVERT) 25 MG tablet Take 1 tablet (25 mg total) by mouth 3 (three) times daily as needed for dizziness. 06/09/20   Krayton Wortley A, PA-C  nitroGLYCERIN (NITROSTAT) 0.4 MG SL tablet Place 1 tablet (0.4 mg total) under the tongue every 5 (five) minutes x 3 doses as needed for chest pain. 08/19/19   Abelino Derrick, PA-C   Allergies    Patient has no known allergies.  Review of Systems   Review of Systems  Constitutional: Positive for fatigue (x 1 year). Negative for activity change, appetite change, chills, diaphoresis, fever and unexpected weight change.  HENT: Negative.   Respiratory: Negative.   Cardiovascular: Positive for chest pain ("Odd sensation without pain x 1 year").  Gastrointestinal: Negative.   Genitourinary: Negative.   Musculoskeletal: Negative.   Skin: Negative.   Neurological: Positive for headaches (x1 year). Negative for dizziness, tremors, seizures, syncope, facial asymmetry, speech difficulty, weakness, light-headedness and numbness.  All other systems reviewed and are negative.  Physical Exam Updated Vital Signs BP (!) 174/102   Pulse 72   Temp 98.6 F (37 C) (Oral)   Resp 16   Ht 5\' 10"  (1.778 m)   Wt 81.6 kg   SpO2 98%   BMI 25.83 kg/m   Physical Exam Vitals and nursing note reviewed.  Constitutional:      General: He is not in acute distress.    Appearance: He is well-developed. He is not ill-appearing, toxic-appearing or diaphoretic.  HENT:     Head: Normocephalic and atraumatic.     Jaw: There is normal jaw occlusion.     Right Ear:  Hearing, tympanic membrane, ear canal and external ear normal. No middle ear effusion. There is no impacted cerumen. No foreign body. No mastoid tenderness.     Left Ear: Hearing, tympanic membrane, ear canal and external ear normal.  No middle ear effusion. There is no impacted cerumen. No foreign body. No mastoid tenderness.     Nose: Nose normal.     Mouth/Throat:     Lips: Pink.     Mouth: Mucous membranes are moist.     Pharynx: Oropharynx is clear. Uvula midline.     Comments: Posterior oropharynx clear.  Tongue midline. Eyes:     Extraocular Movements: Extraocular movements intact.     Conjunctiva/sclera: Conjunctivae normal.     Pupils: Pupils are equal, round, and  reactive to light.     Comments: No nystagmus  Neck:     Vascular: No carotid bruit or JVD.     Trachea: Trachea and phonation normal.     Meningeal: Brudzinski's sign and Kernig's sign absent.     Comments: Full active and passive ROM without pain No midline or paraspinal tenderness No nuchal rigidity or meningeal signs  Cardiovascular:     Rate and Rhythm: Normal rate and regular rhythm.     Pulses: Normal pulses.          Radial pulses are 2+ on the right side and 2+ on the left side.       Posterior tibial pulses are 2+ on the right side and 2+ on the left side.     Heart sounds: Normal heart sounds.  Pulmonary:     Effort: Pulmonary effort is normal. No respiratory distress.     Breath sounds: Normal breath sounds and air entry.     Comments: Clear to auscultation bilateral without wheeze, rhonchi or rales Chest:     Chest wall: No deformity, swelling, tenderness, crepitus or edema.     Comments: Equal rise and fall to chest wall, no crepitus. Abdominal:     General: Bowel sounds are normal. There is no distension.     Palpations: Abdomen is soft. There is no mass or pulsatile mass.     Tenderness: There is no abdominal tenderness. There is no right CVA tenderness, left CVA tenderness, guarding or rebound.       Hernia: No hernia is present.     Comments: Soft, nontender without rebound or guarding.  No overlying skin changes.  No obvious hernias.  No midline pulsatile abdominal mass  Musculoskeletal:        General: Normal range of motion.     Cervical back: Full passive range of motion without pain, normal range of motion and neck supple.     Right lower leg: No edema.     Left lower leg: No edema.     Comments: Moves all 4 extremities without difficulty.  No bony tenderness.  Compartments soft.  Denna Haggard' sign negative.  Feet:     Right foot:     Skin integrity: Skin integrity normal.     Left foot:     Skin integrity: Skin integrity normal.  Skin:    General: Skin is warm and dry.     Capillary Refill: Capillary refill takes less than 2 seconds.     Comments: Tactile temperature to extremities.  No edema, erythema or warmth  Neurological:     General: No focal deficit present.     Mental Status: He is alert and oriented to person, place, and time.     Comments: Mental Status:  Alert, oriented, thought content appropriate. Speech fluent without evidence of aphasia. Able to follow 2 step commands without difficulty.  Cranial Nerves:  II:  Peripheral visual fields grossly normal, pupils equal, round, reactive to light III,IV, VI: ptosis not present, extra-ocular motions intact bilaterally  V,VII: smile symmetric, facial light touch sensation equal VIII: hearing grossly normal bilaterally  IX,X: midline uvula rise  XI: bilateral shoulder shrug equal and strong XII: midline tongue extension  Motor:  5/5 in upper and lower extremities bilaterally including strong and equal grip strength and dorsiflexion/plantar flexion Sensory: Pinprick and light touch normal in all extremities.  Deep Tendon Reflexes: 2+ and symmetric  Cerebellar: normal finger-to-nose with bilateral upper extremities Gait: normal gait and balance CV:  distal pulses palpable throughout        ED Results / Procedures  / Treatments   Labs (all labs ordered are listed, but only abnormal results are displayed) Labs Reviewed  BASIC METABOLIC PANEL - Abnormal; Notable for the following components:      Result Value   Glucose, Bld 102 (*)    All other components within normal limits  CBC  TROPONIN I (HIGH SENSITIVITY)  TROPONIN I (HIGH SENSITIVITY)    EKG EKG Interpretation  Date/Time:  Friday June 09 2020 11:26:13 EDT Ventricular Rate:  76 PR Interval:  150 QRS Duration: 92 QT Interval:  386 QTC Calculation: 434 R Axis:   66 Text Interpretation: Normal sinus rhythm similar to Oct 30 2019 Confirmed by Pricilla LovelessGoldston, Scott [phone]([id number]) on 06/09/2020 1:25:19 PM   Radiology DG Chest 2 View  Result Date: 06/09/2020 CLINICAL DATA:  Chest pain, shortness of breath, headache; had COVID-19 in December 2020, had MI in November 2020 EXAM: CHEST - 2 VIEW COMPARISON:  10/30/2019 FINDINGS: Normal heart size, mediastinal contours, and pulmonary vascularity. Atherosclerotic calcification aorta. Lungs clear. No pulmonary infiltrate, pleural effusion or pneumothorax. Decreased peribronchial thickening versus prior exam. Bones demineralized. IMPRESSION: No acute abnormalities. Interval improvement in bronchitic changes and previously seen infiltrates. Aortic Atherosclerosis (ICD10-I70.0). Electronically Signed   By: Ulyses SouthwardMark  Boles M.D.   On: 06/09/2020 12:00   CT Head Wo Contrast  Result Date: 06/09/2020 CLINICAL DATA:  Chronic headache, no new features EXAM: CT HEAD WITHOUT CONTRAST TECHNIQUE: Contiguous axial images were obtained from the base of the skull through the vertex without intravenous contrast. COMPARISON:  None. FINDINGS: Brain: No evidence of acute infarction, hemorrhage, hydrocephalus, extra-axial collection, visible mass lesion or mass effect. Symmetric prominence of the ventricles, cisterns and sulci compatible with parenchymal volume loss. Patchy areas of white matter hypoattenuation are most compatible with  chronic microvascular angiopathy. Midline intracranial structures are normal. Cerebellar tonsils are normally positioned. Vascular: Atherosclerotic calcification of the carotid siphons and intradural vertebral arteries. No hyperdense vessel. Skull: No calvarial fracture or suspicious osseous lesion. No scalp swelling or hematoma. Sinuses/Orbits: Minimal thickening of the frontal and anterior ethmoid sinuses. No air-fluid levels are pneumatized secretions. Paranasal sinuses and mastoid air cells are otherwise predominantly clear. Middle ear cavities are clear. Included orbital structures are unremarkable. Other: None IMPRESSION: 1. No acute intracranial abnormality. 2. Mild age advanced parenchymal volume loss and chronic microvascular angiopathy. Electronically Signed   By: Kreg ShropshirePrice  DeHay M.D.   On: 06/09/2020 16:10   Procedures Procedures (including critical care time)  Medications Ordered in ED Medications  meclizine (ANTIVERT) tablet 25 mg (25 mg Oral Given 06/09/20 1524)   ED Course  I have reviewed the triage vital signs and the nursing notes.  Pertinent labs & imaging results that were available during my care of the patient were reviewed by me and considered in my medical decision making (see chart for details).  59 year old presents for evaluation of multiple complaints. He is afebrile, nonseptic, non-ill-appearing.    Has had chest pain, headache over the last year.  Had MI November 2020 subsequently cath.  Already on beta-blockers and hyperlipidemia medications.  Has had a "odd sensation" to his chest since his NSTEMI.  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  There is no pleuritic component.  Does not radiate to left arm, left arm or back.  No lower extremity edema, erythema or warmth.  Has not followed with cardiology to make them  aware of this.  States his chest pain is "pain itself but a weird sensation."  Wells criteria low risk, heart score  3. Symptoms do not seem consistent with ACS, unstable an, dissection, pneumothorax, bacterial infectious process. VSS, no tracheal deviation, no JVD or new murmur, RRR, breath sounds equal bilaterally, EKG without acute abnormalities, negative troponin, and negative CXR.    Patient also with left-sided headache x1 year.  No recent injury or trauma.  Occurs daily in nature.  No neck stiffness or neck rigidity.  No visual field changes.  No associated paresthesias, weakness or difficulty with word finding.  Has not taken anything for his symptoms.  Patient with nonfocal neuro exam without deficits.  CT head without any acute abnormality.  Do not feel patient needs MRI at this time.  Presentation non concerning for dissection, CVA, ICH, Meningitis, or temporal arteritis. Pt is afebrile with no focal neuro deficits, nuchal rigidity, or change in vision. Pt is to follow up with PCP to discuss prophylactic medication. Pt verbalizes understanding and is agreeable with plan to dc.   Patient also with lightheaded and dizziness particularly when he goes from sitting to standing.  Patient states if he turns his head quick it feels like the room "spins around."  Has no nystagmus on exam.  No gait abnormalities.  This has been occurring x1 year.  I have low suspicion for central cause of vertigo.  Nonfocal neuro exam without deficits.  Orthostatic vital signs negative.  Think patient symptoms are likely due to peripheral vertigo versus related to his beta-blocker given his lightheadedness began after his beta-blocker.  He has no bradycardia here in the emergency department.  I stressed to follow-up with cardiology.  I discussed patient with social work.  He was given resources to follow-up outpatient.  I discussed return precautions.  He will return for any worsening symptoms.  The patient has been appropriately medically screened and/or stabilized in the ED. I have low suspicion for any other emergent medical  condition which would require further screening, evaluation or treatment in the ED or require inpatient management.  Patient is hemodynamically stable and in no acute distress.  Patient able to ambulate in department prior to ED.  Evaluation does not show acute pathology that would require ongoing or additional emergent interventions while in the emergency department or further inpatient treatment.  I have discussed the diagnosis with the patient and answered all questions.  Pain is been managed while in the emergency department and patient has no further complaints prior to discharge.  Patient is comfortable with plan discussed in room and is stable for discharge at this time.  I have discussed strict return precautions for returning to the emergency department.  Patient was encouraged to follow-up with PCP/specialist refer to at discharge.    MDM Rules/Calculators/A&P                           Final Clinical Impression(s) / ED Diagnoses Final diagnoses:  Nonspecific chest pain  Headache, chronic daily    Rx / DC Orders ED Discharge Orders         Ordered    meclizine (ANTIVERT) 25 MG tablet  3 times daily PRN        06/09/20 1710           Dezmon Conover A, PA-C 06/09/20 1725    Pricilla Loveless, MD 06/13/20 [phone]

## 2020-06-09 NOTE — Discharge Instructions (Signed)
Follow-up with a cardiologist and neurologist.  [facility] can help you establish primary care.  Take the meclizine as needed for dizziness.  Return for new or worsening symptoms.

## 2020-06-09 NOTE — ED Notes (Signed)
The pt is c/o dizziness for 3 weeks  He passed a swallow screen  At present he has some mild chest pain

## 2020-07-02 ENCOUNTER — Other Ambulatory Visit: Payer: Self-pay | Admitting: Cardiology

## 2020-07-03 ENCOUNTER — Other Ambulatory Visit: Payer: Self-pay | Admitting: Cardiology

## 2020-09-05 ENCOUNTER — Other Ambulatory Visit: Payer: Self-pay | Admitting: Internal Medicine

## 2020-09-14 ENCOUNTER — Other Ambulatory Visit: Payer: Self-pay | Admitting: Medical

## 2020-09-15 ENCOUNTER — Telehealth: Payer: Self-pay | Admitting: Internal Medicine

## 2020-09-15 MED ORDER — ATORVASTATIN CALCIUM 80 MG PO TABS: ORAL_TABLET | ORAL | 0 refills | Status: DC

## 2020-09-15 NOTE — Telephone Encounter (Signed)
Pt informed that refill request sent for 90 days to pharmacy. Advised to keep appointment in January. Advised to request longer-term refill at his next office visit. The patient verbalizes understanding and agreement with plan.

## 2020-09-15 NOTE — Telephone Encounter (Signed)
*  STAT* If patient is at the pharmacy, call can be transferred to refill team.   1. Which medications need to be refilled? (please list name of each medication and dose if known) atorvastatin (LIPITOR) 80 MG tablet  2. Which pharmacy/location (including street and city if local pharmacy) is medication to be sent to? [id number] - [place], [place] - [address] AT [facility] [address]  3. Do they need a 30 day or 90 day supply? 90 day supply   Pt is completely out of medication. He has an appt scheduled for 09/21/20.

## 2020-09-21 ENCOUNTER — Encounter: Payer: Self-pay | Admitting: Internal Medicine

## 2020-09-21 ENCOUNTER — Other Ambulatory Visit: Payer: Self-pay

## 2020-09-21 ENCOUNTER — Ambulatory Visit (INDEPENDENT_AMBULATORY_CARE_PROVIDER_SITE_OTHER): Payer: Self-pay | Admitting: Internal Medicine

## 2020-09-21 VITALS — BP 124/70 | HR 80 | Ht 71.0 in | Wt 187.4 lb

## 2020-09-21 DIAGNOSIS — I214 Non-ST elevation (NSTEMI) myocardial infarction: Secondary | ICD-10-CM

## 2020-09-21 DIAGNOSIS — E785 Hyperlipidemia, unspecified: Secondary | ICD-10-CM

## 2020-09-21 DIAGNOSIS — I10 Essential (primary) hypertension: Secondary | ICD-10-CM

## 2020-09-21 DIAGNOSIS — I2510 Atherosclerotic heart disease of native coronary artery without angina pectoris: Secondary | ICD-10-CM

## 2020-09-21 MED ORDER — ROSUVASTATIN CALCIUM 40 MG PO TABS: 40.0000 mg | ORAL_TABLET | Freq: Every day | ORAL | 3 refills | Status: DC

## 2020-09-21 NOTE — Progress Notes (Signed)
Cardiology Office Note:    Date:  09/21/2020   ID:  Barry Gutierrez, DOB December 04, 1960, MRN [id number]  PCP:  Patient, No Pcp Per  Cardiologist:  Parke Poisson, MD  Electrophysiologist:  None   Referring MD: No ref. provider found   Chief Complaint/Reason for Referral: CAD  History of Present Illness:    Barry Gutierrez is a 60 y.o. male with a history of CAD, admitted through the ED 07/31/2019 with chest pain and ruled in for a NSTEMI. Early in the morning 11/30 he became aggressive and combative and was escorted out of the hospital by security. He returned in the afternoon 08/02/2019 with recurrent chest pain. He was admitted started on ASA, statin, Heparin, and beta blocker. He was brought to the cath lab 08/04/2019 but again became aggressive and combative in the cath lab. He had to be admitted to ICU and sedated before cath could be done. Mid RCA was occluded with left to right collaterals.   He has not been seen since 08/19/2019. No chest pain. Class II dyspnea with exertion. Overall though does not feel great. No palpitations, PND, orthopnea, or leg swelling. Denies cough, fever, chills. Denies nausea, vomiting. Denies syncope or presyncope. Denies dizziness or lightheadedness.  Past Medical History:  Diagnosis Date  . Allergy   . MI (myocardial infarction) [facility])     Past Surgical History:  Procedure Laterality Date  . LEFT HEART CATH AND CORONARY ANGIOGRAPHY N/A 08/04/2019   Procedure: LEFT HEART CATH AND CORONARY ANGIOGRAPHY;  Surgeon: Iran Ouch, MD;  [facility]: [facility];  Service: Cardiovascular;  Laterality: N/A;    Current Medications: Current Meds  Medication Sig  . acetaminophen (TYLENOL) 325 MG tablet Take 325-650 mg by mouth every 6 (six) hours as needed (for pain).  Marland Kitchen aspirin EC 81 MG EC tablet Take 1 tablet (81 mg total) by mouth daily.  Marland Kitchen atorvastatin (LIPITOR) 80 MG tablet TAKE 1 TABLET(80 MG) BY MOUTH DAILY AT 6 PM  . carvedilol (COREG)  6.25 MG tablet TAKE 1 TABLET(6.25 MG) BY MOUTH TWICE DAILY WITH A MEAL  . nitroGLYCERIN (NITROSTAT) 0.4 MG SL tablet Place 1 tablet (0.4 mg total) under the tongue every 5 (five) minutes x 3 doses as needed for chest pain.     Allergies:   Patient has no known allergies.   Social History   Tobacco Use  . Smoking status: Current Every Day Smoker    Packs/day: 2.00    Types: Cigarettes  . Smokeless tobacco: Never Used  Substance Use Topics  . Alcohol use: Yes  . Drug use: Yes    Types: Marijuana    Comment: every other month or so      Family History: The patient's family history includes CAD in his father and mother; Cancer in his mother; Coronary artery disease in his father and mother; Diabetes in his father.  ROS:   Please see the history of present illness.    All other systems reviewed and are negative.  EKGs/Labs/Other Studies Reviewed:    The following studies were reviewed today:  EKG:  NSR, rate 80 bpm  I have independently reviewed the images from [facility] 08/04/2019.  Recent Labs: 10/30/2019: ALT 33 06/09/2020: BUN 10; Creatinine, Ser 0.82; Hemoglobin 14.0; Platelets 157; Potassium 4.1; Sodium 141  Recent Lipid Panel    Component Value Date/Time   CHOL 212 (H) 08/01/2019 0154   TRIG 191 (H) 08/01/2019 0154   HDL 46 08/01/2019 0154  CHOLHDL 4.6 08/01/2019 0154   VLDL 38 08/01/2019 0154   LDLCALC 128 (H) 08/01/2019 0154    Physical Exam:    VS:  BP 124/70   Pulse 80   Ht 5\' 11"  (1.803 m)   Wt 187 lb 6.4 oz (85 kg)   SpO2 96%   BMI 26.14 kg/m     Wt Readings from Last 5 Encounters:  09/21/20 187 lb 6.4 oz (85 kg)  06/09/20 180 lb (81.6 kg)  11/14/19 195 lb (88.5 kg)  10/30/19 190 lb (86.2 kg)  08/19/19 204 lb (92.5 kg)    Constitutional: No acute distress Eyes: sclera non-icteric, normal conjunctiva and lids ENMT: normal dentition, moist mucous membranes Cardiovascular: regular rhythm, normal rate, no murmurs. S1 and S2 normal. Radial pulses normal  bilaterally. No jugular venous distention.  Respiratory: clear to auscultation bilaterally GI : normal bowel sounds, soft and nontender. No distention.   MSK: extremities warm, well perfused. No edema.  NEURO: grossly nonfocal exam, moves all extremities. PSYCH: alert and oriented x 3, normal mood and affect.   ASSESSMENT:    1. Coronary artery disease involving native coronary artery of native heart without angina pectoris   2. NSTEMI (non-ST elevated myocardial infarction) (HCC)   3. Essential hypertension   4. Dyslipidemia, goal LDL below 70    PLAN:    Coronary artery disease involving native coronary artery of native heart without angina pectoris - Plan: EKG 12-Lead NSTEMI (non-ST elevated myocardial infarction) (HCC) Essential hypertension Dyslipidemia, goal LDL below 70   - continue ASA 81 mg daily - start crestor 40 mg daily.  - carvedilol 6.25 mg BID. - normal LV function on echo during hospitalization. Could repeat for dyspnea. - Wants to establish with primary care, will provide some [facility] recommendations.   Total time of encounter: 20 minutes total time of encounter, including 15 minutes spent in face-to-face patient care on the date of this encounter. This time includes coordination of care and counseling regarding above mentioned problem list. Remainder of non-face-to-face time involved reviewing chart documents/testing relevant to the patient encounter and documentation in the medical record. I have independently reviewed documentation from referring provider.   [facility], MD [place]  [facility]    Medication Adjustments/Labs and Tests Ordered: Current medicines are reviewed at length with the patient today.  Concerns regarding medicines are outlined above.   Orders Placed This Encounter  Procedures  . EKG 12-Lead    Meds ordered this encounter  Medications  . rosuvastatin (CRESTOR) 40 MG tablet    Sig: Take 1 tablet (40 mg total) by mouth  daily.    Dispense:  90 tablet    Refill:  3    Patient Instructions  Medication Instructions:  STOP LIPITOR  START: CRESTOR 40mg  DAILY  *If you need a refill on your cardiac medications before your next appointment, please call your pharmacy*  Follow-Up: At [facility], you and your health needs are our priority.  As part of our continuing mission to provide you with exceptional heart care, we have created designated Provider Care Teams.  These Care Teams include your primary Cardiologist (physician) and Advanced Practice Providers (APPs -  Physician Assistants and Nurse Practitioners) who all work together to provide you with the care you need, when you need it.  Your next appointment:   1 month(s)  The format for your next appointment:   In Person  Provider:   WITH AN APP   Other Instructions PLEASE CONTACT [facility] FOR PRIMARY CARE SERVICES

## 2020-09-21 NOTE — Patient Instructions (Addendum)
Medication Instructions:  STOP LIPITOR  START: CRESTOR 40mg  DAILY  *If you need a refill on your cardiac medications before your next appointment, please call your pharmacy*  Follow-Up: At [facility], you and your health needs are our priority.  As part of our continuing mission to provide you with exceptional heart care, we have created designated Provider Care Teams.  These Care Teams include your primary Cardiologist (physician) and Advanced Practice Providers (APPs -  Physician Assistants and Nurse Practitioners) who all work together to provide you with the care you need, when you need it.  Your next appointment:   1 month(s)  The format for your next appointment:   In Person  Provider:   WITH AN APP   Other Instructions PLEASE CONTACT [facility] FOR PRIMARY CARE SERVICES

## 2020-11-14 ENCOUNTER — Emergency Department (HOSPITAL_COMMUNITY): Payer: BC Managed Care – PPO

## 2020-11-14 ENCOUNTER — Observation Stay (HOSPITAL_COMMUNITY)
Admission: EM | Admit: 2020-11-14 | Discharge: 2020-11-15 | Disposition: A | Discharge status: Home / Self Care | Payer: BC Managed Care – PPO | Attending: Cardiology | Admitting: Cardiology

## 2020-11-14 ENCOUNTER — Other Ambulatory Visit: Payer: Self-pay

## 2020-11-14 ENCOUNTER — Encounter (HOSPITAL_COMMUNITY): Payer: Self-pay | Admitting: Emergency Medicine

## 2020-11-14 ENCOUNTER — Ambulatory Visit: Payer: Self-pay

## 2020-11-14 DIAGNOSIS — F17210 Nicotine dependence, cigarettes, uncomplicated: Secondary | ICD-10-CM | POA: Diagnosis not present

## 2020-11-14 DIAGNOSIS — Z7982 Long term (current) use of aspirin: Secondary | ICD-10-CM | POA: Diagnosis not present

## 2020-11-14 DIAGNOSIS — Z95820 Peripheral vascular angioplasty status with implants and grafts: Secondary | ICD-10-CM

## 2020-11-14 DIAGNOSIS — E782 Mixed hyperlipidemia: Secondary | ICD-10-CM

## 2020-11-14 DIAGNOSIS — Z20822 Contact with and (suspected) exposure to covid-19: Secondary | ICD-10-CM | POA: Diagnosis not present

## 2020-11-14 DIAGNOSIS — I252 Old myocardial infarction: Secondary | ICD-10-CM | POA: Insufficient documentation

## 2020-11-14 DIAGNOSIS — I200 Unstable angina: Secondary | ICD-10-CM

## 2020-11-14 DIAGNOSIS — R5382 Chronic fatigue, unspecified: Secondary | ICD-10-CM | POA: Diagnosis not present

## 2020-11-14 DIAGNOSIS — I10 Essential (primary) hypertension: Secondary | ICD-10-CM | POA: Diagnosis not present

## 2020-11-14 DIAGNOSIS — R0989 Other specified symptoms and signs involving the circulatory and respiratory systems: Secondary | ICD-10-CM | POA: Diagnosis not present

## 2020-11-14 DIAGNOSIS — R42 Dizziness and giddiness: Secondary | ICD-10-CM | POA: Diagnosis not present

## 2020-11-14 DIAGNOSIS — Z79899 Other long term (current) drug therapy: Secondary | ICD-10-CM | POA: Insufficient documentation

## 2020-11-14 DIAGNOSIS — E785 Hyperlipidemia, unspecified: Secondary | ICD-10-CM | POA: Diagnosis present

## 2020-11-14 DIAGNOSIS — R079 Chest pain, unspecified: Secondary | ICD-10-CM | POA: Diagnosis not present

## 2020-11-14 DIAGNOSIS — I2510 Atherosclerotic heart disease of native coronary artery without angina pectoris: Secondary | ICD-10-CM | POA: Diagnosis present

## 2020-11-14 DIAGNOSIS — I25110 Atherosclerotic heart disease of native coronary artery with unstable angina pectoris: Principal | ICD-10-CM | POA: Insufficient documentation

## 2020-11-14 LAB — CBC
HCT: 43.8 % (ref 39.0–52.0)
Hemoglobin: 15.3 g/dL (ref 13.0–17.0)
MCH: 33.5 pg (ref 26.0–34.0)
MCHC: 34.9 g/dL (ref 30.0–36.0)
MCV: 95.8 fL (ref 80.0–100.0)
Platelets: 135 10*3/uL — ABNORMAL LOW (ref 150–400)
RBC: 4.57 MIL/uL (ref 4.22–5.81)
RDW: 12.5 % (ref 11.5–15.5)
WBC: 5.6 10*3/uL (ref 4.0–10.5)
nRBC: 0 % (ref 0.0–0.2)

## 2020-11-14 LAB — BASIC METABOLIC PANEL
Anion gap: 9 (ref 5–15)
BUN: 7 mg/dL (ref 6–20)
CO2: 22 mmol/L (ref 22–32)
Calcium: 9.2 mg/dL (ref 8.9–10.3)
Chloride: 104 mmol/L (ref 98–111)
Creatinine, Ser: 0.82 mg/dL (ref 0.61–1.24)
GFR, Estimated: >60 mL/min (ref >60)
Glucose, Bld: 103 mg/dL — ABNORMAL HIGH (ref 70–99)
Potassium: 3.6 mmol/L (ref 3.5–5.1)
Sodium: 135 mmol/L (ref 135–145)

## 2020-11-14 LAB — RESP PANEL BY RT-PCR (FLU A&B, COVID) ARPGX2
Influenza A by PCR: NEGATIVE
Influenza B by PCR: NEGATIVE
SARS Coronavirus 2 by RT PCR: NEGATIVE

## 2020-11-14 LAB — TROPONIN I (HIGH SENSITIVITY)
Troponin I (High Sensitivity): 10 ng/L (ref <18)
Troponin I (High Sensitivity): 8 ng/L (ref <18)

## 2020-11-14 MED ORDER — ROSUVASTATIN CALCIUM 20 MG PO TABS: 40.0000 mg | ORAL_TABLET | Freq: Every day | ORAL | Status: DC

## 2020-11-14 MED ORDER — ROSUVASTATIN CALCIUM 20 MG PO TABS
40.0000 mg | ORAL_TABLET | Freq: Every day | ORAL | Status: DC
  Administered 2020-11-15: 40 mg via ORAL
  Filled 2020-11-14: qty 2

## 2020-11-14 MED ORDER — SODIUM CHLORIDE 0.9% FLUSH: 3.0000 mL | INTRAVENOUS | Status: DC | PRN

## 2020-11-14 MED ORDER — SODIUM CHLORIDE 0.9 % WEIGHT BASED INFUSION
1.0000 mL/kg/h | INTRAVENOUS | Status: DC
  Administered 2020-11-15 (×2): 1 mL/kg/h via INTRAVENOUS

## 2020-11-14 MED ORDER — HEPARIN (PORCINE) 25000 UT/250ML-% IV SOLN
1300.0000 [IU]/h | INTRAVENOUS | Status: DC
  Administered 2020-11-14: 1050 [IU]/h via INTRAVENOUS
  Administered 2020-11-15: 1300 [IU]/h via INTRAVENOUS
  Filled 2020-11-14 (×2): qty 250

## 2020-11-14 MED ORDER — SODIUM CHLORIDE 0.9 % IV SOLN: 250.0000 mL | INTRAVENOUS | Status: DC | PRN

## 2020-11-14 MED ORDER — SODIUM CHLORIDE 0.9% FLUSH
3.0000 mL | Freq: Two times a day (BID) | INTRAVENOUS | Status: DC
Start: 2020-11-14 — End: 2020-11-15

## 2020-11-14 MED ORDER — ONDANSETRON HCL 4 MG/2ML IJ SOLN: 4.0000 mg | Freq: Four times a day (QID) | INTRAMUSCULAR | Status: DC | PRN

## 2020-11-14 MED ORDER — SODIUM CHLORIDE 0.9 % WEIGHT BASED INFUSION
3.0000 mL/kg/h | INTRAVENOUS | Status: AC
Start: 2020-11-15 — End: 2020-11-15
  Administered 2020-11-15: 3 mL/kg/h via INTRAVENOUS

## 2020-11-14 MED ORDER — LOSARTAN POTASSIUM 25 MG PO TABS
25.0000 mg | ORAL_TABLET | Freq: Every day | ORAL | Status: DC
  Administered 2020-11-14 – 2020-11-15 (×2): 25 mg via ORAL
  Filled 2020-11-14 (×2): qty 1

## 2020-11-14 MED ORDER — ACETAMINOPHEN 500 MG PO TABS
1000.0000 mg | ORAL_TABLET | Freq: Once | ORAL | Status: AC
  Administered 2020-11-14: 1000 mg via ORAL
  Filled 2020-11-14: qty 2

## 2020-11-14 MED ORDER — HEPARIN BOLUS VIA INFUSION
4000.0000 [IU] | Freq: Once | INTRAVENOUS | Status: AC
  Administered 2020-11-14: 4000 [IU] via INTRAVENOUS
  Filled 2020-11-14: qty 4000

## 2020-11-14 MED ORDER — ASPIRIN 81 MG PO CHEW
162.0000 mg | CHEWABLE_TABLET | Freq: Once | ORAL | Status: AC
  Administered 2020-11-14: 162 mg via ORAL
  Filled 2020-11-14: qty 2

## 2020-11-14 MED ORDER — NITROGLYCERIN 0.4 MG SL SUBL
0.4000 mg | SUBLINGUAL_TABLET | SUBLINGUAL | Status: DC | PRN
  Administered 2020-11-14 (×2): 0.4 mg via SUBLINGUAL
  Filled 2020-11-14: qty 1

## 2020-11-14 MED ORDER — ASPIRIN EC 81 MG PO TBEC
81.0000 mg | DELAYED_RELEASE_TABLET | Freq: Every day | ORAL | Status: DC
  Administered 2020-11-15: 81 mg via ORAL
  Filled 2020-11-14 (×2): qty 1

## 2020-11-14 MED ORDER — ACETAMINOPHEN 325 MG PO TABS
650.0000 mg | ORAL_TABLET | ORAL | Status: DC | PRN
  Administered 2020-11-15: 650 mg via ORAL
  Filled 2020-11-14: qty 2

## 2020-11-14 MED ORDER — CARVEDILOL 12.5 MG PO TABS
12.5000 mg | ORAL_TABLET | Freq: Two times a day (BID) | ORAL | Status: DC
  Administered 2020-11-14 – 2020-11-15 (×2): 12.5 mg via ORAL
  Filled 2020-11-14: qty 4
  Filled 2020-11-14: qty 1

## 2020-11-14 NOTE — Telephone Encounter (Signed)
  Pt. Reports he had a heart attack in 2020. Seen at [facility]. States he has increased shortness of breath, fatigue. Had chest pain last night that woke him up. No chest pain now. Instructed to call his cardiologist. Instructed to go to ED if he can not be seen and for return of chest pain. Verbalizes understanding. Reason for Disposition . Patient sounds very sick or weak to the triager  Answer Assessment - Initial Assessment Questions 1. RESPIRATORY STATUS: "Describe your breathing?" (e.g., wheezing, shortness of breath, unable to speak, severe coughing)      Shortness of breath 2. ONSET: "When did this breathing problem begin?"      Last night 3. PATTERN "Does the difficult breathing come and go, or has it been constant since it started?"      Comes and goes 4. SEVERITY: "How bad is your breathing?" (e.g., mild, moderate, severe)    - MILD: No SOB at rest, mild SOB with walking, speaks normally in sentences, can lay down, no retractions, pulse < 100.    - MODERATE: SOB at rest, SOB with minimal exertion and prefers to sit, cannot lie down flat, speaks in phrases, mild retractions, audible wheezing, pulse 100-120.    - SEVERE: Very SOB at rest, speaks in single words, struggling to breathe, sitting hunched forward, retractions, pulse > 120      Mild 5. RECURRENT SYMPTOM: "Have you had difficulty breathing before?" If Yes, ask: "When was the last time?" and "What happened that time?"      Yes 6. CARDIAC HISTORY: "Do you have any history of heart disease?" (e.g., heart attack, angina, bypass surgery, angioplasty)      Heart attack - 2020 7. LUNG HISTORY: "Do you have any history of lung disease?"  (e.g., pulmonary embolus, asthma, emphysema)     No 8. CAUSE: "What do you think is causing the breathing problem?"      Unsure 9. OTHER SYMPTOMS: "Do you have any other symptoms? (e.g., dizziness, runny nose, cough, chest pain, fever)     Chest pain  10. PREGNANCY: "Is there any chance  you are pregnant?" "When was your last menstrual period?"       n/a 11. TRAVEL: "Have you traveled out of the country in the last month?" (e.g., travel history, exposures)       No  Protocols used: BREATHING DIFFICULTY-A-AH

## 2020-11-14 NOTE — Progress Notes (Signed)
ANTICOAGULATION CONSULT NOTE - Initial Consult  Pharmacy Consult for heparin Indication: chest pain/ACS  No Known Allergies  Patient Measurements: Height: 5\' 10"  (177.8 cm) Weight: 81.6 kg (180 lb) IBW/kg (Calculated) : 73 Heparin Dosing Weight: 81.6 kg  Vital Signs: Temp: 99.5 F (37.5 C) (03/15 1245) BP: 171/99 (03/15 1600) Pulse Rate: 86 (03/15 1600)  Labs: Recent Labs    11/14/20 1257 11/14/20 1541  HGB 15.3  --   HCT 43.8  --   PLT 135*  --   CREATININE 0.82  --   TROPONINIHS 10 8    Estimated Creatinine Clearance: 102.6 mL/min (by C-G formula based on SCr of 0.82 mg/dL).   Medical History: Past Medical History:  Diagnosis Date  . Allergy   . MI (myocardial infarction) (HCC)     Medications:  (Not in a hospital admission)   Assessment: 35 YOM who presents with chest pain. Pharmacy consulted to start IV heparin for ACS.   H/H wnl, Plt low. SCr wnl  Goal of Therapy:  Heparin level 0.3-0.7 units/ml Monitor platelets by anticoagulation protocol: Yes   Plan:  -Heparin 4000 units IV bolus followed by heparin infusion at 1050 units/hr -F/u 6 hr HL -Monitor daily HL, CBC and s/s of bleeding   60, PharmD., BCPS, BCCCP Clinical Pharmacist Please refer to [facility] for unit-specific pharmacist

## 2020-11-14 NOTE — ED Triage Notes (Signed)
Onset 3 days ago developed chest pain, shortness of breath, and dizziness. History of MI 1 year ago. Pain currently 5/10 pressure with left side headache 5/10 achy.

## 2020-11-14 NOTE — ED Notes (Signed)
This RN attempted to give report to [facility], will call back to give report

## 2020-11-14 NOTE — ED Provider Notes (Signed)
[name] EMERGENCY DEPARTMENT Provider Note   CSN: [id number] Arrival date & time: 11/14/20  1241     History Chief Complaint  Patient presents with  . Chest Pain  . Shortness of Breath  . Dizziness    Barry Gutierrez is a 60 y.o. male past medical history of MI and multivessel CAD, hyperlipidemia, hypertension, presenting to the emergency department with complaint of chest pain.  He states over the last few days he has had progressively worsening chest pain described as a constant pressure sensation.  Intermittently his symptoms will worsen for example he was awoken from sleep last night with worsening pain.  He states he is intermittently had diaphoresis.  He feels worse with exertion including shortness of breath and lightheadedness on exertion.  Denies palpitations, fever, cough.  He treated today with 2 aspirins.  Has not taken his nitroglycerin for symptoms.  States he stopped smoking about 8 months ago.  He drinks about 2-3 alcoholic drinks daily, about 2 beers and 1 shot.  Denies history of blood clot.  Not on anticoagulation. Also endorses left sided headache, that has been coming and going over the last few days, worse than usual headache. No vision changes or neuro sx.   Per review of medical record, patient was admitted in December 2020 for NSTEMI.  Had cardiac catheterization done which showed 100% stenosis of the mid RCA, proximal RCA is 40% stenosis, Ost circumflex and proximal circumflex also with 40% stenosis.  Was medically managed at the time.  The history is provided by the patient and medical records.       Past Medical History:  Diagnosis Date  . Allergy   . MI (myocardial infarction) [facility])     Patient Active Problem List   Diagnosis Date Noted  . Alcohol withdrawal delirium (HCC) 08/04/2019  . Dyslipidemia, goal LDL below 70 08/04/2019  . CAD (coronary artery disease) 08/04/2019  . Unstable angina (HCC) 08/03/2019  . Essential  hypertension   . Alcohol abuse   . NSTEMI (non-ST elevated myocardial infarction) (HCC) 07/31/2019  . Diverticulosis of colon 02/05/2018  . Upper airway cough syndrome 11/13/2017  . Cigarette smoker 11/13/2017  . Asthmatic bronchitis with acute exacerbation 11/12/2017    Past Surgical History:  Procedure Laterality Date  . LEFT HEART CATH AND CORONARY ANGIOGRAPHY N/A 08/04/2019   Procedure: LEFT HEART CATH AND CORONARY ANGIOGRAPHY;  Surgeon: Iran Ouch, MD;  Location: [facility] INVASIVE CV LAB;  Service: Cardiovascular;  Laterality: N/A;       Family History  Problem Relation Age of Onset  . Cancer Mother   . CAD Mother   . Coronary artery disease Mother   . Diabetes Father   . CAD Father   . Coronary artery disease Father     Social History   Tobacco Use  . Smoking status: Current Every Day Smoker    Packs/day: 2.00    Types: Cigarettes  . Smokeless tobacco: Never Used  Substance Use Topics  . Alcohol use: Yes  . Drug use: Yes    Types: Marijuana    Comment: every other month or so     Home Medications Prior to Admission medications   Medication Sig Start Date End Date Taking? Authorizing Provider  acetaminophen (TYLENOL) 325 MG tablet Take 325-650 mg by mouth every 6 (six) hours as needed (for pain).    [provider]  aspirin EC 81 MG EC tablet Take 1 tablet (81 mg total) by mouth daily.  08/05/19   Furth, Cadence H, PA-C  carvedilol (COREG) 6.25 MG tablet TAKE 1 TABLET(6.25 MG) BY MOUTH TWICE DAILY WITH A MEAL 07/03/20   Kilroy, Eda Paschal, PA-C  nitroGLYCERIN (NITROSTAT) 0.4 MG SL tablet Place 1 tablet (0.4 mg total) under the tongue every 5 (five) minutes x 3 doses as needed for chest pain. 08/19/19   Abelino Derrick, PA-C  rosuvastatin (CRESTOR) 40 MG tablet Take 1 tablet (40 mg total) by mouth daily. 09/21/20   Parke Poisson, MD    Allergies    Patient has no known allergies.  Review of Systems   Review of Systems  All other systems reviewed and  are negative.   Physical Exam Updated Vital Signs BP (!) 173/109 (BP Location: Right Arm)   Pulse 83   Temp 99.5 F (37.5 C)   Resp 14   Ht 5\' 10"  (1.778 m)   Wt 81.6 kg   SpO2 98%   BMI 25.83 kg/m   Physical Exam Vitals and nursing note reviewed.  Constitutional:      General: He is not in acute distress.    Appearance: He is well-developed.  HENT:     Head: Normocephalic and atraumatic.  Eyes:     Extraocular Movements: Extraocular movements intact.     Conjunctiva/sclera: Conjunctivae normal.     Pupils: Pupils are equal, round, and reactive to light.  Cardiovascular:     Rate and Rhythm: Normal rate and regular rhythm.  Pulmonary:     Effort: Pulmonary effort is normal. No respiratory distress.     Breath sounds: Normal breath sounds.  Chest:     Chest wall: No tenderness.  Abdominal:     General: Bowel sounds are normal.     Palpations: Abdomen is soft.     Tenderness: There is no abdominal tenderness. There is no guarding or rebound.  Musculoskeletal:     Right lower leg: No edema.     Left lower leg: No edema.  Skin:    General: Skin is warm.  Neurological:     Mental Status: He is alert.  Psychiatric:        Behavior: Behavior normal.     ED Results / Procedures / Treatments   Labs (all labs ordered are listed, but only abnormal results are displayed) Labs Reviewed  BASIC METABOLIC PANEL - Abnormal; Notable for the following components:      Result Value   Glucose, Bld 103 (*)    All other components within normal limits  CBC - Abnormal; Notable for the following components:   Platelets 135 (*)    All other components within normal limits  RESP PANEL BY RT-PCR (FLU A&B, COVID) [id number]  TROPONIN I (HIGH SENSITIVITY)  TROPONIN I (HIGH SENSITIVITY)    EKG EKG Interpretation  Date/Time:  Tuesday November 14 2020 12:43:48 EDT Ventricular Rate:  90 PR Interval:  144 QRS Duration: 92 QT Interval:  364 QTC Calculation: 445 R Axis:   66 Text  Interpretation: Normal sinus rhythm Normal ECG Confirmed by [id number] (Lorre Nick) on 11/14/2020 1:03:41 PM   Radiology DG Chest 2 View  Result Date: 11/14/2020 CLINICAL DATA:  Venous of breath, dizziness, fatigue, history coronary artery disease post MI, smoker, hypertension EXAM: CHEST - 2 VIEW COMPARISON:  06/09/2020 FINDINGS: Normal heart size, mediastinal contours, and pulmonary vascularity. Lungs clear. No pulmonary infiltrate, pleural effusion, or pneumothorax. Atherosclerotic calcifications aorta. Bones unremarkable. IMPRESSION: No acute abnormalities. Aortic Atherosclerosis (ICD10-I70.0). Electronically Signed   By: 08/09/2020  Tyron Russell M.D.   On: 11/14/2020 13:44    Procedures Procedures   Medications Ordered in ED Medications  nitroGLYCERIN (NITROSTAT) SL tablet 0.4 mg (0.4 mg Sublingual Given 11/14/20 1447)  acetaminophen (TYLENOL) tablet 1,000 mg (has no administration in time range)  aspirin chewable tablet 162 mg (162 mg Oral Given 11/14/20 1432)    ED Course  I have reviewed the triage vital signs and the nursing notes.  Pertinent labs & imaging results that were available during my care of the patient were reviewed by me and considered in my medical decision making (see chart for details).  Clinical Course as of 11/14/20 1540  Tue Nov 14, 2020  1502 Dr. Delton See with cardiology consulted, Cardiology will admit for unstable angina. [JR]    Clinical Course User Index [JR] Saarah Dewing, [place] N, PA-C   MDM Rules/Calculators/A&P                          Patient with known history of multi--vessel CAD, presenting with worsening chest pain over the last 3 days.  Symptoms are concerning for unstable angina.  He describes a constant chest pressure with worsening symptoms with exertion including shortness of breath lightheadedness, intermittent diaphoresis.  Initial troponin is within normal limits.  EKG without acute changes from baseline.  Chest pain improved with nitroglycerin.  He is  given 162 mg of aspirin here to complete 325 mg today.  However considering his history with abnormal cath 2 years ago, consult was placed to cardiologist, Dr. Delton See.  She recommends admission to cardiology service for further cardiac work-up.   Patient is in agreement with plan.  Final Clinical Impression(s) / ED Diagnoses Final diagnoses:  Unstable angina [facility])    Rx / DC Orders ED Discharge Orders    None       Rosalind Guido, [place] N, PA-C 11/14/20 1540    Terrilee Files, MD 11/14/20 1755

## 2020-11-14 NOTE — H&P (Addendum)
Cardiology Admission History and Physical:   Patient ID: Barry Gutierrez MRN: [id number]; DOB: 1961/05/13   Admission date: 11/14/2020  PCP:  Patient, No Pcp Per    [facility]  Cardiologist:  Parke Poisson, MD   Chief Complaint: Chest pain  History of Present Illness:   [name] is a 60 y.o. male with prior history of CAD, non-STEMI in December 2020 with cardiac cath showing mid RCA lesion is 100% stenosed. Prox RCA lesion is 40% stenosed. Ost Cx to Prox Cx lesion is 40% stenosed.  It was concluded that it is severe one-vessel coronary artery disease with occluded mid right coronary artery with good left-to-right collaterals.  This is likely chronic over acute on chronic.  The patient states that ever since he has never felt well he continued to feel very tired with exertional chest pains, he walks about 4 miles a day, he still works and is active however in the last several weeks he has been experiencing worsening dyspnea, exertional chest pains and diaphoretic episodes and presyncopal episodes on several occasions.  He is EKG in the ER showed normal sinus rhythm nonspecific ST-T wave abnormalities unchanged from prior.  His blood pressure is elevated up to 180, his troponin is negative x2.  He has normal creatinine hemoglobin and platelet count.  Normal electrolytes.  He states that he has been compliant with his meds and eats healthy, he quit smoking about 9 months ago but continues to vape and uses nicotine patches.   Past Medical History:  Diagnosis Date  . Allergy   . MI (myocardial infarction) [facility])     Past Surgical History:  Procedure Laterality Date  . LEFT HEART CATH AND CORONARY ANGIOGRAPHY N/A 08/04/2019   Procedure: LEFT HEART CATH AND CORONARY ANGIOGRAPHY;  Surgeon: Iran Ouch, MD;  Location: [facility] INVASIVE CV LAB;  Service: Cardiovascular;  Laterality: N/A;     Medications Prior to Admission: Prior to Admission medications    Medication Sig Start Date End Date Taking? Authorizing Provider  acetaminophen (TYLENOL) 325 MG tablet Take 325-650 mg by mouth every 6 (six) hours as needed for mild pain (for pain).   Yes [provider]  aspirin EC 81 MG EC tablet Take 1 tablet (81 mg total) by mouth daily. 08/05/19  Yes Furth, Cadence H, PA-C  carvedilol (COREG) 6.25 MG tablet TAKE 1 TABLET(6.25 MG) BY MOUTH TWICE DAILY WITH A MEAL Patient taking differently: Take 6.25 mg by mouth 2 (two) times daily with a meal. 07/03/20  Yes Kilroy, Luke K, PA-C  nitroGLYCERIN (NITROSTAT) 0.4 MG SL tablet Place 1 tablet (0.4 mg total) under the tongue every 5 (five) minutes x 3 doses as needed for chest pain. 08/19/19  Yes Kilroy, Luke K, PA-C  rosuvastatin (CRESTOR) 40 MG tablet Take 1 tablet (40 mg total) by mouth daily. 09/21/20  Yes Parke Poisson, MD     Allergies:   No Known Allergies  Social History:   Social History   Socioeconomic History  . Marital status: Legally Separated    Spouse name: Not on file  . Number of children: Not on file  . Years of education: Not on file  . Highest education level: Not on file  Occupational History  . Not on file  Tobacco Use  . Smoking status: Current Every Day Smoker    Packs/day: 2.00    Types: Cigarettes  . Smokeless tobacco: Never Used  Substance and Sexual Activity  . Alcohol use:  Yes  . Drug use: Yes    Types: Marijuana    Comment: every other month or so   . Sexual activity: Not Currently  Other Topics Concern  . Not on file  Social History Narrative  . Not on file   Social Determinants of Health   Financial Resource Strain: Not on file  Food Insecurity: Not on file  Transportation Needs: Not on file  Physical Activity: Not on file  Stress: Not on file  Social Connections: Not on file  Intimate Partner Violence: Not on file    Family History:   The patient's family history includes CAD in his father and mother; Cancer in his mother; Coronary artery  disease in his father and mother; Diabetes in his father.    ROS:  Please see the history of present illness.  All other ROS reviewed and negative.     Physical Exam/Data:   Vitals:   11/14/20 1245 11/14/20 1255 11/14/20 1419 11/14/20 1600  BP: (!) 146/104  (!) 173/109 (!) 171/99  Pulse: 100  83 86  Resp: 18  14 18   Temp: 99.5 F (37.5 C)     SpO2: 95%  98% 98%  Weight:  81.6 kg    Height:  5\' 10"  (1.778 m)     No intake or output data in the 24 hours ending 11/14/20 1704 Last 3 Weights 11/14/2020 09/21/2020 06/09/2020  Weight (lbs) 180 lb 187 lb 6.4 oz 180 lb  Weight (kg) 81.647 kg 85.004 kg 81.647 kg     Body mass index is 25.83 kg/m.  General:  Well nourished, well developed, in no acute distress HEENT: normal Lymph: no adenopathy Neck: no JVD Endocrine:  No thryomegaly Vascular: No carotid bruits; FA pulses 2+ bilaterally without bruits  Cardiac:  normal S1, S2; RRR; no murmur  Lungs:  clear to auscultation bilaterally, no wheezing, rhonchi or rales  Abd: soft, nontender, no hepatomegaly  Ext: no edema Musculoskeletal:  No deformities, BUE and BLE strength normal and equal Skin: warm and dry  Neuro:  CNs 2-12 intact, no focal abnormalities noted Psych:  Normal affect    EKG:  The ECG that was done  was personally reviewed and demonstrates normal sinus rhythm, normal EKG  Relevant CV Studies: Echo is pending  Laboratory Data:  High Sensitivity Troponin:   Recent Labs  Lab 11/14/20 1257 11/14/20 1541  TROPONINIHS 10 8      Chemistry Recent Labs  Lab 11/14/20 1257  NA 135  K 3.6  CL 104  CO2 22  GLUCOSE 103*  BUN 7  CREATININE 0.82  CALCIUM 9.2  GFRNONAA >60  ANIONGAP 9    No results for input(s): PROT, ALBUMIN, AST, ALT, ALKPHOS, BILITOT in the last 168 hours. Hematology Recent Labs  Lab 11/14/20 1257  WBC 5.6  RBC 4.57  HGB 15.3  HCT 43.8  MCV 95.8  MCH 33.5  MCHC 34.9  RDW 12.5  PLT 135*   BNPNo results for input(s): BNP, PROBNP  in the last 168 hours.  DDimer No results for input(s): DDIMER in the last 168 hours.   Radiology/Studies:  DG Chest 2 View  Result Date: 11/14/2020 CLINICAL DATA:  Venous of breath, dizziness, fatigue, history coronary artery disease post MI, smoker, hypertension EXAM: CHEST - 2 VIEW COMPARISON:  06/09/2020 FINDINGS: Normal heart size, mediastinal contours, and pulmonary vascularity. Lungs clear. No pulmonary infiltrate, pleural effusion, or pneumothorax. Atherosclerotic calcifications aorta. Bones unremarkable. IMPRESSION: No acute abnormalities. Aortic Atherosclerosis (ICD10-I70.0). Electronically Signed  By: Ulyses Southward M.D.   On: 11/14/2020 13:44     Assessment and Plan:   1. Unstable angina 2.   Known CAD with occluded mid RCA with left-to-right collaterals, no worsening symptoms, we will plan for cardiac catheterization tomorrow, start heparin drip, continue aspirin atorvastatin 80 mg daily, increase carvedilol to 12.5 mg daily, add losartan 25 mg daily. 3. Hypertension -management as above 4. Hyperlipidemia -on Crestor 40 mg daily, will give Lipitor 80 mg daily here.   Risk Assessment/Risk Scores:     TIMI Risk Score for Unstable Angina or Non-ST Elevation MI:   The patient's TIMI risk score is 4, which indicates a 20% risk of all cause mortality, new or recurrent myocardial infarction or need for urgent revascularization in the next 14 days.  Severity of Illness: The appropriate patient status for this patient is INPATIENT. Inpatient status is judged to be reasonable and necessary in order to provide the required intensity of service to ensure the patient's safety. The patient's presenting symptoms, physical exam findings, and initial radiographic and laboratory data in the context of their chronic comorbidities is felt to place them at high risk for further clinical deterioration. Furthermore, it is not anticipated that the patient will be medically stable for discharge from the  hospital within 2 midnights of admission. The following factors support the patient status of inpatient.   " The patient's presenting symptoms include chest pain. " The worrisome physical exam findings include unstable angina. " The initial radiographic and laboratory data are worrisome because of known CAD " The chronic co-morbidities include CAD, hypertension, smoking.  * I certify that at the point of admission it is my clinical judgment that the patient will require inpatient hospital care spanning beyond 2 midnights from the point of admission due to high intensity of service, high risk for further deterioration and high frequency of surveillance required.*    For questions or updates, please contact [facility] Please consult [email or web address] for contact info under     Signed, Tobias Alexander, MD  11/14/2020 5:04 PM

## 2020-11-14 NOTE — ED Notes (Signed)
Dr Eloy End at bedside

## 2020-11-15 ENCOUNTER — Other Ambulatory Visit: Payer: Self-pay | Admitting: Cardiology

## 2020-11-15 ENCOUNTER — Encounter (HOSPITAL_COMMUNITY)
Admission: EM | Disposition: A | Discharge status: Home / Self Care | Payer: Self-pay | Source: Home / Self Care | Attending: Emergency Medicine

## 2020-11-15 ENCOUNTER — Observation Stay (HOSPITAL_BASED_OUTPATIENT_CLINIC_OR_DEPARTMENT_OTHER): Payer: BC Managed Care – PPO

## 2020-11-15 ENCOUNTER — Encounter (HOSPITAL_COMMUNITY): Payer: Self-pay | Admitting: Cardiology

## 2020-11-15 DIAGNOSIS — Z20822 Contact with and (suspected) exposure to covid-19: Secondary | ICD-10-CM | POA: Diagnosis not present

## 2020-11-15 DIAGNOSIS — E782 Mixed hyperlipidemia: Secondary | ICD-10-CM | POA: Diagnosis not present

## 2020-11-15 DIAGNOSIS — I10 Essential (primary) hypertension: Secondary | ICD-10-CM | POA: Diagnosis not present

## 2020-11-15 DIAGNOSIS — R079 Chest pain, unspecified: Secondary | ICD-10-CM

## 2020-11-15 DIAGNOSIS — Z95820 Peripheral vascular angioplasty status with implants and grafts: Secondary | ICD-10-CM

## 2020-11-15 DIAGNOSIS — I252 Old myocardial infarction: Secondary | ICD-10-CM | POA: Diagnosis not present

## 2020-11-15 DIAGNOSIS — I25110 Atherosclerotic heart disease of native coronary artery with unstable angina pectoris: Secondary | ICD-10-CM | POA: Diagnosis not present

## 2020-11-15 DIAGNOSIS — I200 Unstable angina: Secondary | ICD-10-CM | POA: Diagnosis not present

## 2020-11-15 HISTORY — PX: LEFT HEART CATH AND CORONARY ANGIOGRAPHY: CATH118249

## 2020-11-15 HISTORY — DX: Peripheral vascular angioplasty status with implants and grafts: Z95.820

## 2020-11-15 HISTORY — PX: CORONARY STENT INTERVENTION: CATH118234

## 2020-11-15 LAB — HEPARIN LEVEL (UNFRACTIONATED): Heparin Unfractionated: 0.12 IU/mL — ABNORMAL LOW (ref 0.30–0.70)

## 2020-11-15 LAB — ECHOCARDIOGRAM COMPLETE
Area-P 1/2: 3.53 cm2
Calc EF: 53.4 %
Height: 70 in
S' Lateral: 4 cm
Single Plane A2C EF: 56.1 %
Single Plane A4C EF: 58.5 %
Weight: 2832 oz

## 2020-11-15 LAB — LIPID PANEL
Cholesterol: 177 mg/dL (ref 0–200)
HDL: 64 mg/dL (ref >40)
LDL Cholesterol: 49 mg/dL (ref 0–99)
Total CHOL/HDL Ratio: 2.8 RATIO
Triglycerides: 318 mg/dL — ABNORMAL HIGH (ref <150)
VLDL: 64 mg/dL — ABNORMAL HIGH (ref 0–40)

## 2020-11-15 LAB — CBC
HCT: 41 % (ref 39.0–52.0)
Hemoglobin: 14.2 g/dL (ref 13.0–17.0)
MCH: 33.2 pg (ref 26.0–34.0)
MCHC: 34.6 g/dL (ref 30.0–36.0)
MCV: 95.8 fL (ref 80.0–100.0)
Platelets: 120 10*3/uL — ABNORMAL LOW (ref 150–400)
RBC: 4.28 MIL/uL (ref 4.22–5.81)
RDW: 12.5 % (ref 11.5–15.5)
WBC: 6.1 10*3/uL (ref 4.0–10.5)
nRBC: 0 % (ref 0.0–0.2)

## 2020-11-15 LAB — BASIC METABOLIC PANEL
Anion gap: 8 (ref 5–15)
BUN: 8 mg/dL (ref 6–20)
CO2: 24 mmol/L (ref 22–32)
Calcium: 8.8 mg/dL — ABNORMAL LOW (ref 8.9–10.3)
Chloride: 102 mmol/L (ref 98–111)
Creatinine, Ser: 0.81 mg/dL (ref 0.61–1.24)
GFR, Estimated: >60 mL/min (ref >60)
Glucose, Bld: 126 mg/dL — ABNORMAL HIGH (ref 70–99)
Potassium: 3.3 mmol/L — ABNORMAL LOW (ref 3.5–5.1)
Sodium: 134 mmol/L — ABNORMAL LOW (ref 135–145)

## 2020-11-15 LAB — HEMOGLOBIN A1C
Hgb A1c MFr Bld: 5 % (ref 4.8–5.6)
Mean Plasma Glucose: 96.8 mg/dL

## 2020-11-15 LAB — POCT ACTIVATED CLOTTING TIME
Activated Clotting Time: 309 seconds
Activated Clotting Time: 428 seconds

## 2020-11-15 SURGERY — LEFT HEART CATH AND CORONARY ANGIOGRAPHY
Anesthesia: LOCAL

## 2020-11-15 MED ORDER — SODIUM CHLORIDE 0.9% FLUSH: 3.0000 mL | Freq: Two times a day (BID) | INTRAVENOUS | Status: DC

## 2020-11-15 MED ORDER — TICAGRELOR 90 MG PO TABS
ORAL_TABLET | ORAL | Status: AC
  Filled 2020-11-15: qty 2

## 2020-11-15 MED ORDER — TICAGRELOR 90 MG PO TABS: 90.0000 mg | ORAL_TABLET | Freq: Two times a day (BID) | ORAL | 11 refills | Status: DC

## 2020-11-15 MED ORDER — SODIUM CHLORIDE 0.9 % IV SOLN
250.0000 mL | INTRAVENOUS | Status: DC | PRN
Start: 2020-11-15 — End: 2020-11-16

## 2020-11-15 MED ORDER — FENOFIBRATE 160 MG PO TABS
160.0000 mg | ORAL_TABLET | Freq: Every day | ORAL | Status: DC
  Administered 2020-11-15: 160 mg via ORAL
  Filled 2020-11-15: qty 1

## 2020-11-15 MED ORDER — HYDRALAZINE HCL 20 MG/ML IJ SOLN: 10.0000 mg | INTRAMUSCULAR | Status: DC | PRN

## 2020-11-15 MED ORDER — ACETAMINOPHEN 325 MG PO TABS: 650.0000 mg | ORAL_TABLET | ORAL | Status: DC | PRN

## 2020-11-15 MED ORDER — LOSARTAN POTASSIUM 50 MG PO TABS: 50.0000 mg | ORAL_TABLET | Freq: Every day | ORAL | 6 refills | Status: DC

## 2020-11-15 MED ORDER — HEPARIN BOLUS VIA INFUSION
2000.0000 [IU] | Freq: Once | INTRAVENOUS | Status: AC
  Administered 2020-11-15: 2000 [IU] via INTRAVENOUS
  Filled 2020-11-15: qty 2000

## 2020-11-15 MED ORDER — FENTANYL CITRATE (PF) 100 MCG/2ML IJ SOLN
INTRAMUSCULAR | Status: DC | PRN
  Administered 2020-11-15 (×2): 25 ug via INTRAVENOUS

## 2020-11-15 MED ORDER — LABETALOL HCL 5 MG/ML IV SOLN: 10.0000 mg | INTRAVENOUS | Status: DC | PRN

## 2020-11-15 MED ORDER — MIDAZOLAM HCL 2 MG/2ML IJ SOLN
INTRAMUSCULAR | Status: AC
  Filled 2020-11-15: qty 2

## 2020-11-15 MED ORDER — SODIUM CHLORIDE 0.9% FLUSH: 3.0000 mL | INTRAVENOUS | Status: DC | PRN

## 2020-11-15 MED ORDER — LIDOCAINE HCL (PF) 1 % IJ SOLN
INTRAMUSCULAR | Status: DC | PRN
  Administered 2020-11-15: 2 mL

## 2020-11-15 MED ORDER — CARVEDILOL 12.5 MG PO TABS: 12.5000 mg | ORAL_TABLET | Freq: Two times a day (BID) | ORAL | 6 refills | Status: DC

## 2020-11-15 MED ORDER — LOSARTAN POTASSIUM 50 MG PO TABS: 50.0000 mg | ORAL_TABLET | Freq: Every day | ORAL | Status: DC

## 2020-11-15 MED ORDER — IOHEXOL 350 MG/ML SOLN
INTRAVENOUS | Status: DC | PRN
  Administered 2020-11-15: 115 mL

## 2020-11-15 MED ORDER — VERAPAMIL HCL 2.5 MG/ML IV SOLN
INTRAVENOUS | Status: DC | PRN
  Administered 2020-11-15: 10 mL via INTRA_ARTERIAL

## 2020-11-15 MED ORDER — HEPARIN (PORCINE) IN NACL 1000-0.9 UT/500ML-% IV SOLN
INTRAVENOUS | Status: DC | PRN
  Administered 2020-11-15 (×2): 500 mL

## 2020-11-15 MED ORDER — FENOFIBRATE 160 MG PO TABS: 160.0000 mg | ORAL_TABLET | Freq: Every day | ORAL | 6 refills | Status: DC

## 2020-11-15 MED ORDER — LIDOCAINE HCL (PF) 1 % IJ SOLN
INTRAMUSCULAR | Status: AC
  Filled 2020-11-15: qty 30

## 2020-11-15 MED ORDER — NITROGLYCERIN 1 MG/10 ML FOR IR/CATH LAB
INTRA_ARTERIAL | Status: AC
  Filled 2020-11-15: qty 10

## 2020-11-15 MED ORDER — TICAGRELOR 90 MG PO TABS: 90.0000 mg | ORAL_TABLET | Freq: Two times a day (BID) | ORAL | Status: DC

## 2020-11-15 MED ORDER — POTASSIUM CHLORIDE CRYS ER 20 MEQ PO TBCR
40.0000 meq | EXTENDED_RELEASE_TABLET | ORAL | Status: AC
  Administered 2020-11-15: 40 meq via ORAL
  Filled 2020-11-15: qty 2

## 2020-11-15 MED ORDER — NITROGLYCERIN 1 MG/10 ML FOR IR/CATH LAB
INTRA_ARTERIAL | Status: DC | PRN
  Administered 2020-11-15 (×4): 200 ug via INTRACORONARY

## 2020-11-15 MED ORDER — SODIUM CHLORIDE 0.9 % IV SOLN: INTRAVENOUS | Status: AC

## 2020-11-15 MED ORDER — ONDANSETRON HCL 4 MG/2ML IJ SOLN: 4.0000 mg | Freq: Four times a day (QID) | INTRAMUSCULAR | Status: DC | PRN

## 2020-11-15 MED ORDER — MIDAZOLAM HCL 2 MG/2ML IJ SOLN
INTRAMUSCULAR | Status: DC | PRN
  Administered 2020-11-15: 2 mg via INTRAVENOUS
  Administered 2020-11-15: 1 mg via INTRAVENOUS

## 2020-11-15 MED ORDER — ASPIRIN 81 MG PO CHEW: 81.0000 mg | CHEWABLE_TABLET | Freq: Every day | ORAL | Status: DC

## 2020-11-15 MED ORDER — HEPARIN SODIUM (PORCINE) 1000 UNIT/ML IJ SOLN
INTRAMUSCULAR | Status: AC
  Filled 2020-11-15: qty 1

## 2020-11-15 MED ORDER — TICAGRELOR 90 MG PO TABS
ORAL_TABLET | ORAL | Status: DC | PRN
  Administered 2020-11-15: 180 mg via ORAL

## 2020-11-15 MED ORDER — NITROGLYCERIN 0.4 MG SL SUBL: 0.4000 mg | SUBLINGUAL_TABLET | SUBLINGUAL | 1 refills | Status: DC | PRN

## 2020-11-15 MED ORDER — HEPARIN SODIUM (PORCINE) 1000 UNIT/ML IJ SOLN
INTRAMUSCULAR | Status: DC | PRN
  Administered 2020-11-15: 6000 [IU] via INTRAVENOUS

## 2020-11-15 MED ORDER — HEPARIN (PORCINE) IN NACL 1000-0.9 UT/500ML-% IV SOLN
INTRAVENOUS | Status: AC
  Filled 2020-11-15: qty 1000

## 2020-11-15 MED ORDER — VERAPAMIL HCL 2.5 MG/ML IV SOLN
INTRAVENOUS | Status: AC
  Filled 2020-11-15: qty 2

## 2020-11-15 MED ORDER — FENTANYL CITRATE (PF) 100 MCG/2ML IJ SOLN
INTRAMUSCULAR | Status: AC
  Filled 2020-11-15: qty 2

## 2020-11-15 SURGICAL SUPPLY — 20 items
BALLN SAPPHIRE 2.0X15 (BALLOONS) ×2
BALLN SAPPHIRE ~~LOC~~ 3.25X15 (BALLOONS) ×1 IMPLANT
BALLOON SAPPHIRE 2.0X15 (BALLOONS) IMPLANT
CATH 5FR JL3.5 JR4 ANG PIG MP (CATHETERS) ×1 IMPLANT
CATH LAUNCHER 6FR JR4 (CATHETERS) ×1 IMPLANT
DEVICE RAD COMP TR BAND LRG (VASCULAR PRODUCTS) ×1 IMPLANT
GLIDESHEATH SLEND SS 6F .021 (SHEATH) ×1 IMPLANT
GUIDEWIRE INQWIRE 1.5J.035X260 (WIRE) IMPLANT
INQWIRE 1.5J .035X260CM (WIRE) ×2
KIT ENCORE 26 ADVANTAGE (KITS) ×1 IMPLANT
KIT HEART LEFT (KITS) ×2 IMPLANT
KIT HEMO VALVE WATCHDOG (MISCELLANEOUS) ×1 IMPLANT
PACK CARDIAC CATHETERIZATION (CUSTOM PROCEDURE TRAY) ×2 IMPLANT
SHEATH PROBE COVER 6X72 (BAG) ×1 IMPLANT
STENT RESOLUTE ONYX 2.75X38 (Permanent Stent) ×1 IMPLANT
STENT RESOLUTE ONYX 3.0X8 (Permanent Stent) ×1 IMPLANT
TRANSDUCER W/STOPCOCK (MISCELLANEOUS) ×2 IMPLANT
TUBING CIL FLEX 10 FLL-RA (TUBING) ×2 IMPLANT
WIRE ASAHI PROWATER 180CM (WIRE) ×1 IMPLANT
WIRE HI TORQ BMW 190CM (WIRE) ×1 IMPLANT

## 2020-11-15 NOTE — Progress Notes (Signed)
[id number] Discussed importance of brilinta with stent. Reviewed NTG use, heart healthy diet given, walking guidelines for ex, and CRP 2. Referred to [place] program. Pt has been working on tobacco cessation. Encouraged to call 1800quitnow if needed. Pt is using nicotine patches at this time and making good strides toward cessation. Luetta Nutting RN BSN 11/15/2020 3:13 PM

## 2020-11-15 NOTE — Plan of Care (Signed)
  Problem: Education: Goal: Knowledge of General Education information will improve Description Including pain rating scale, medication(s)/side effects and non-pharmacologic comfort measures Outcome: Progressing   

## 2020-11-15 NOTE — Progress Notes (Signed)
ANTICOAGULATION CONSULT NOTE  Pharmacy Consult for heparin Indication: chest pain/ACS  No Known Allergies  Patient Measurements: Height: 5\' 10"  (177.8 cm) Weight: 81.9 kg (180 lb 8 oz) IBW/kg (Calculated) : 73 Heparin Dosing Weight: 81.6 kg  Vital Signs: Temp: 98.7 F (37.1 C) (03/15 2357) Temp Source: Oral (03/15 2357) BP: 153/92 (03/15 2357) Pulse Rate: 78 (03/15 2357)  Labs: Recent Labs    11/14/20 1257 11/14/20 1541 11/15/20 0012  HGB 15.3  --  14.2  HCT 43.8  --  41.0  PLT 135*  --  120*  HEPARINUNFRC  --   --  0.12*  CREATININE 0.82  --  0.81  TROPONINIHS 10 8  --     Estimated Creatinine Clearance: 103.9 mL/min (by C-G formula based on SCr of 0.81 mg/dL).  Assessment: 60 y.o. male with chest pain for heparin   Goal of Therapy:  Heparin level 0.3-0.7 units/ml Monitor platelets by anticoagulation protocol: Yes   Plan:  Heparin 2000 units IV bolus, then increase heparin  1300 units/hr  Follow up after cath today   60, PharmD, BCPS

## 2020-11-15 NOTE — Progress Notes (Addendum)
 Progress Note  Patient Name: Barry Gutierrez Date of Encounter: 11/15/2020  [facility] Cardiologist: Gayatri A Acharya, MD   Subjective   No SOB  Inpatient Medications    Scheduled Meds: . aspirin EC  81 mg Oral Daily  . carvedilol  12.5 mg Oral BID WC  . losartan  25 mg Oral Daily  . rosuvastatin  40 mg Oral Daily  . sodium chloride flush  3 mL Intravenous Q12H   Continuous Infusions: . sodium chloride    . sodium chloride 1 mL/kg/hr (11/15/20 0518)  . heparin 1,300 Units/hr (11/15/20 0943)   PRN Meds: sodium chloride, acetaminophen, nitroGLYCERIN, ondansetron (ZOFRAN) IV, sodium chloride flush   Vital Signs    Vitals:   11/14/20 2049 11/14/20 2357 11/15/20 0315 11/15/20 0805  BP: (!) 164/84 (!) 153/92 132/84 (!) 166/84  Pulse: 89 78 76 70  Resp: 20 17 19 19  Temp: 99.5 F (37.5 C) 98.7 F (37.1 C) 98.3 F (36.8 C) 98.2 F (36.8 C)  TempSrc: Oral Oral Oral Oral  SpO2: 99% 98% 100% 99%  Weight: 81.9 kg  80.3 kg   Height: 5' 10" (1.778 m)       Intake/Output Summary (Last 24 hours) at 11/15/2020 0949 Last data filed at 11/15/2020 0600 Gross per 24 hour  Intake 247.78 ml  Output 300 ml  Net -52.22 ml   Last 3 Weights 11/15/2020 11/14/2020 11/14/2020  Weight (lbs) 177 lb 180 lb 8 oz 180 lb  Weight (kg) 80.287 kg 81.874 kg 81.647 kg      Telemetry    SR - Personally Reviewed  ECG    SR with Qtc 480 ms no acute ST changes - Personally Reviewed  Physical Exam  Exam per Dr. Jackelyne Sayer GEN: No acute distress.   Neck: No JVD Cardiac: RRR, no murmurs, rubs, or gallops.  Respiratory: Clear to auscultation bilaterally. GI: Soft, nontender, non-distended  MS: No edema; No deformity. Neuro:  Nonfocal  Psych: Normal affect   Labs    High Sensitivity Troponin:   Recent Labs  Lab 11/14/20 1257 11/14/20 1541  TROPONINIHS 10 8      Chemistry Recent Labs  Lab 11/14/20 1257 11/15/20 0012  NA 135 134*  K 3.6 3.3*  CL 104 102  CO2 22 24  GLUCOSE  103* 126*  BUN 7 8  CREATININE 0.82 0.81  CALCIUM 9.2 8.8*  GFRNONAA >60 >60  ANIONGAP 9 8     Hematology Recent Labs  Lab 11/14/20 1257 11/15/20 0012  WBC 5.6 6.1  RBC 4.57 4.28  HGB 15.3 14.2  HCT 43.8 41.0  MCV 95.8 95.8  MCH 33.5 33.2  MCHC 34.9 34.6  RDW 12.5 12.5  PLT 135* 120*    BNPNo results for input(s): BNP, PROBNP in the last 168 hours.   DDimer No results for input(s): DDIMER in the last 168 hours.   Radiology    DG Chest 2 View  Result Date: 11/14/2020 CLINICAL DATA:  Venous of breath, dizziness, fatigue, history coronary artery disease post MI, smoker, hypertension EXAM: CHEST - 2 VIEW COMPARISON:  06/09/2020 FINDINGS: Normal heart size, mediastinal contours, and pulmonary vascularity. Lungs clear. No pulmonary infiltrate, pleural effusion, or pneumothorax. Atherosclerotic calcifications aorta. Bones unremarkable. IMPRESSION: No acute abnormalities. Aortic Atherosclerosis (ICD10-I70.0). Electronically Signed   By: Mark  Boles M.D.   On: 11/14/2020 13:44    Cardiac Studies   Echo and cardiac cath today  Patient Profile     59 y.o. male history   of CAD, non-STEMI in December 2020 with cardiac cath showing mid RCA lesion is 100% stenosed. Prox RCA lesion is 40% stenosed. Ost Cx to Prox Cx lesion is 40% stenosed.  It was concluded that it is severe one-vessel coronary artery disease with occluded mid right coronary artery with good left-to-right collaterals. This is likely chronic over acute on chronic.   Assessment & Plan    Unstable angina for cardiac cath today neg troponin   CAD with occluded mid RCA with left-to-right collaterals, no worsening symptoms, we will plan for cardiac catheterization tomorrow, start heparin drip, continue aspirin atorvastatin 80 mg daily, increase carvedilol to 12.5 mg daily, add losartan 25 mg daily  HTN see above  HLD on crestor, for now lipitor 80  LDL 49  Hypokalemia replaced with 40 meq stat  For questions or  updates, please contact [facility] Please consult [email or web address] for contact info under     Signed, Nada Boozer, NP  11/15/2020, 9:49 AM    The patient was seen, examined and discussed with Nada Boozer, NP and I agree with the above.   Cath scheduled for today, crea normal, continue heparin drip for unstable angina, BP remains elevated, I will increase losartan to 50 mg po daily. Add fenofibrate for TG of 318.   Tobias Alexander, MD 11/15/2020

## 2020-11-15 NOTE — Discharge Summary (Addendum)
Discharge Summary    Patient ID: Barry Gutierrez MRN: [id number]; DOB: 09-Feb-1961  Admit date: 11/14/2020 Discharge date: 11/15/2020  PCP:  Patient, No Pcp Per   [facility]  Cardiologist:  Parke Poisson, MD    Discharge Diagnoses    Principal Problem:   Unstable angina [facility]) Active Problems:   Essential hypertension   Dyslipidemia, goal LDL below 70   CAD (coronary artery disease)   S/P angioplasty with stent DES to RCA 11/15/20   Hypokalemia   Diagnostic Studies/Procedures    Echo 11/15/20 IMPRESSIONS    1. Left ventricular ejection fraction, by estimation, is 55 to 60%. Left  ventricular ejection fraction by 3D volume is 53 %. The left ventricle has  normal function. The left ventricle has no regional wall motion  abnormalities. Left ventricular diastolic  parameters are consistent with Grade I diastolic dysfunction (impaired  relaxation). The average left ventricular global longitudinal strain is  -18.8 %. The global longitudinal strain is normal.  2. Right ventricular systolic function is normal. The right ventricular  size is normal. Tricuspid regurgitation signal is inadequate for assessing  PA pressure.  3. The mitral valve is grossly normal. Trivial mitral valve  regurgitation. No evidence of mitral stenosis.  4. The aortic valve is tricuspid. Aortic valve regurgitation is not  visualized. No aortic stenosis is present.  5. The inferior vena cava is normal in size with greater than 50%  respiratory variability, suggesting right atrial pressure of 3 mmHg.   Comparison(s): No significant change from prior study.   FINDINGS  Left Ventricle: Left ventricular ejection fraction, by estimation, is 55  to 60%. Left ventricular ejection fraction by 3D volume is 53 %. The left  ventricle has normal function. The left ventricle has no regional wall  motion abnormalities. The average  left ventricular global longitudinal strain is  -18.8 %. The global  longitudinal strain is normal. The left ventricular internal cavity size  was normal in size. There is no left ventricular hypertrophy. Left  ventricular diastolic parameters are consistent  with Grade I diastolic dysfunction (impaired relaxation).   Right Ventricle: The right ventricular size is normal. No increase in  right ventricular wall thickness. Right ventricular systolic function is  normal. Tricuspid regurgitation signal is inadequate for assessing PA  pressure.   Left Atrium: Left atrial size was normal in size.   Right Atrium: Right atrial size was normal in size. Prominent Crista  terminalis.   Pericardium: Trivial pericardial effusion is present.   Mitral Valve: The mitral valve is grossly normal. Trivial mitral valve  regurgitation. No evidence of mitral valve stenosis.   Tricuspid Valve: The tricuspid valve is grossly normal. Tricuspid valve  regurgitation is trivial. No evidence of tricuspid stenosis.   Aortic Valve: The aortic valve is tricuspid. Aortic valve regurgitation is  not visualized. No aortic stenosis is present.   Pulmonic Valve: The pulmonic valve was grossly normal. Pulmonic valve  regurgitation is not visualized. No evidence of pulmonic stenosis.   Aorta: The aortic root and ascending aorta are structurally normal, with  no evidence of dilitation.   Venous: The right upper pulmonary vein is normal. The inferior vena cava  is normal in size with greater than 50% respiratory variability,  suggesting right atrial pressure of 3 mmHg.   IAS/Shunts: The interatrial septum appears to be lipomatous. The atrial  septum is grossly normal.    cardiac cath 11/15/20   Ost Cx to Prox Cx lesion is  40% stenosed.  Mid RCA lesion is 90% stenosed. In 2020, this lesion was subtotally occluded. Flow has improved since that time.  A drug-eluting stent was successfully placed using a STENT RESOLUTE ONYX [id number]. A drug-eluting stent was  successfully placed using a STENT RESOLUTE ONYX 3.0X8 overlapping more proximally., postdilated to 3.3 mm.  Post intervention, there is a 0% residual stenosis.  The left ventricular systolic function is normal.  LV end diastolic pressure is normal.  The left ventricular ejection fraction is 55-65% by visual estimate.  There is no aortic valve stenosis.  Prox RCA to Mid RCA lesion is 70% stenosed.  A drug-eluting stent was successfully placed using a STENT RESOLUTE ONYX 3.0X8.   Continue dual antiplatelet therapy for 12 months.  If there is a need to change Brilinta to clopidogrel, would start with 300 mg x 1 followed by 75 mg daily.   OK to discharge later today as his EF is normal and his troponins were negative.  Will try to arrange.  Diagnostic Dominance: Right    Intervention  Intervention         History of Present Illness     Barry Gutierrez is a 60 y.o. male with hx of CAD, HTN, HLD and tobacco smoker (quit recently) presented for CP evaluation.   Hx of NSTEMI 08/2019 cardiac cath showing mid RCA lesion is 100% stenosed. Prox RCA lesion is 40% stenosed. Ost Cx to Prox Cx lesion is 40% stenosed.  It was concluded that it is severe one-vessel coronary artery disease with occluded mid right coronary artery with good left-to-right collaterals.  Pt has had issues since that cath with exertional chest pain.  He is active but has not been his normal.  Over last several weeks he had worsening dyspnea with exertional chest pain and diaphoretic episodes, and presyncope episodes. With increase of symptoms he presented to ER 11/14/20.   EKG in ER unchanged.  BP was elevated and troponin neg.  He stopped tobacco 9 months ago but continues to vape.  He uses nicotine patches.   He was admitted for treatment of angina and cardiac cath.   Hospital Course     Consultants: none   Pt did well overnight on IV heparin, and underwent cardiac cath today.  Pt rec'd stent to RCA DES,  tolerated procedure well.  He is now on Brilinta and asa.  He will be discharged from short stay. Increased Coreg to 12.5mg  BID and added Losartan  qd for elevated BP.  Replaced hypokalemia. Echo showed LVEF of 55-60%, and grade 1 DD.  Radial cath site without hematoma.   The patient been seen by Dr. Eldridge Dace and Dr. Delton See today and deemed ready for discharge home. All follow-up appointments have been scheduled. Discharge medications are listed below.   Did the patient have an acute coronary syndrome (MI, NSTEMI, STEMI, etc) this admission?:  Yes                               AHA/ACC Clinical Performance & Quality Measures: 1. Aspirin prescribed? - Yes 2. ADP Receptor Inhibitor (Plavix/Clopidogrel, Brilinta/Ticagrelor or Effient/Prasugrel) prescribed (includes medically managed patients)? - Yes 3. Beta Blocker prescribed? - Yes 4. High Intensity Statin (Lipitor 40-80mg  or Crestor 20-40mg ) prescribed? - Yes 5. EF assessed during THIS hospitalization? - Yes 6. For EF <40%, was ACEI/ARB prescribed? - Not Applicable (EF >/= 40%) 7. For EF <40%, Aldosterone Antagonist (Spironolactone or Eplerenone)  prescribed? - Not Applicable (EF >/= 40%) 8. Cardiac Rehab Phase II ordered (including medically managed patients)? - Yes   Discharge Vitals Blood pressure (!) 141/73, pulse 83, temperature 98.5 F (36.9 C), temperature source Oral, resp. rate 15, height  (1.778 m), weight 80.3 kg, SpO2 100 %.  Filed Weights   11/14/20 1255 11/14/20 2049 11/15/20 0315  Weight: 81.6 kg 81.9 kg 80.3 kg    Labs & Radiologic Studies    CBC Recent Labs    11/14/20 1257 11/15/20 0012  WBC 5.6 6.1  HGB 15.3 14.2  HCT 43.8 41.0  MCV 95.8 95.8  PLT 135* 120*   Basic Metabolic Panel Recent Labs    16/10/96 1257 11/15/20 0012  NA 135 134*  K 3.6 3.3*  CL 104 102  CO2 22 24  GLUCOSE 103* 126*  BUN 7 8  CREATININE 0.82 0.81  CALCIUM 9.2 8.8*   High Sensitivity Troponin:   Recent Labs  Lab  11/14/20 1257 11/14/20 1541  TROPONINIHS 10 8    Hemoglobin A1C Recent Labs    11/15/20 0014  HGBA1C 5.0   Fasting Lipid Panel Recent Labs    11/15/20 0014  CHOL 177  HDL 64  LDLCALC 49  TRIG 318*  CHOLHDL 2.8  _____________  DG Chest 2 View  Result Date: 11/14/2020 CLINICAL DATA:  Venous of breath, dizziness, fatigue, history coronary artery disease post MI, smoker, hypertension EXAM: CHEST - 2 VIEW COMPARISON:  06/09/2020 FINDINGS: Normal heart size, mediastinal contours, and pulmonary vascularity. Lungs clear. No pulmonary infiltrate, pleural effusion, or pneumothorax. Atherosclerotic calcifications aorta. Bones unremarkable. IMPRESSION: No acute abnormalities. Aortic Atherosclerosis (ICD10-I70.0). Electronically Signed   By: Ulyses Southward M.D.   On: 11/14/2020 13:44   CARDIAC CATHETERIZATION  Result Date: 11/15/2020  Ost Cx to Prox Cx lesion is 40% stenosed.  Mid RCA lesion is 90% stenosed. In 2020, this lesion was subtotally occluded. Flow has improved since that time.  A drug-eluting stent was successfully placed using a STENT RESOLUTE ONYX [id number]. A drug-eluting stent was successfully placed using a STENT RESOLUTE ONYX 3.0X8 overlapping more proximally., postdilated to 3.3 mm.  Post intervention, there is a 0% residual stenosis.  The left ventricular systolic function is normal.  LV end diastolic pressure is normal.  The left ventricular ejection fraction is 55-65% by visual estimate.  There is no aortic valve stenosis.  Prox RCA to Mid RCA lesion is 70% stenosed.  A drug-eluting stent was successfully placed using a STENT RESOLUTE ONYX 3.0X8.  Continue dual antiplatelet therapy for 12 months.  If there is a need to change Brilinta to clopidogrel, would start with 300 mg x 1 followed by 75 mg daily. OK to discharge later today as his EF is normal and his troponins were negative.  Will try to arrange.   ECHOCARDIOGRAM COMPLETE  Result Date: 11/15/2020    ECHOCARDIOGRAM  REPORT   Patient Name:   Barry Gutierrez Date of Exam: 11/15/2020 Medical Rec #:  [id number]        Height:       70.0 in Accession #:    [id number]       Weight:       177.0 lb Date of Birth:  1960-12-30        BSA:          1.982 m Patient Age:    59 years         BP:  166/84 mmHg Patient Gender: M                HR:           73 bpm. Exam Location:  Inpatient Procedure: 2D Echo, 3D Echo, Cardiac Doppler, Color Doppler and Strain Analysis Indications:    R07.9* Chest pain, unspecified  History:        Patient has prior history of Echocardiogram examinations, most                 recent 08/01/2019. CAD, Previous Myocardial Infarction and Acute                 MI, Signs/Symptoms:Chest Pain; Risk Factors:Dyslipidemia and                 Current Smoker. ETOH.  Sonographer:    Sheralyn Boatman RDCS Referring Phys: [id number] Corrin Parker  Sonographer Comments: Global longitudinal strain was attempted. IMPRESSIONS  1. Left ventricular ejection fraction, by estimation, is 55 to 60%. Left ventricular ejection fraction by 3D volume is 53 %. The left ventricle has normal function. The left ventricle has no regional wall motion abnormalities. Left ventricular diastolic  parameters are consistent with Grade I diastolic dysfunction (impaired relaxation). The average left ventricular global longitudinal strain is -18.8 %. The global longitudinal strain is normal.  2. Right ventricular systolic function is normal. The right ventricular size is normal. Tricuspid regurgitation signal is inadequate for assessing PA pressure.  3. The mitral valve is grossly normal. Trivial mitral valve regurgitation. No evidence of mitral stenosis.  4. The aortic valve is tricuspid. Aortic valve regurgitation is not visualized. No aortic stenosis is present.  5. The inferior vena cava is normal in size with greater than 50% respiratory variability, suggesting right atrial pressure of 3 mmHg. Comparison(s): No significant change from prior study.  FINDINGS  Left Ventricle: Left ventricular ejection fraction, by estimation, is 55 to 60%. Left ventricular ejection fraction by 3D volume is 53 %. The left ventricle has normal function. The left ventricle has no regional wall motion abnormalities. The average left ventricular global longitudinal strain is -18.8 %. The global longitudinal strain is normal. The left ventricular internal cavity size was normal in size. There is no left ventricular hypertrophy. Left ventricular diastolic parameters are consistent  with Grade I diastolic dysfunction (impaired relaxation). Right Ventricle: The right ventricular size is normal. No increase in right ventricular wall thickness. Right ventricular systolic function is normal. Tricuspid regurgitation signal is inadequate for assessing PA pressure. Left Atrium: Left atrial size was normal in size. Right Atrium: Right atrial size was normal in size. Prominent Crista terminalis. Pericardium: Trivial pericardial effusion is present. Mitral Valve: The mitral valve is grossly normal. Trivial mitral valve regurgitation. No evidence of mitral valve stenosis. Tricuspid Valve: The tricuspid valve is grossly normal. Tricuspid valve regurgitation is trivial. No evidence of tricuspid stenosis. Aortic Valve: The aortic valve is tricuspid. Aortic valve regurgitation is not visualized. No aortic stenosis is present. Pulmonic Valve: The pulmonic valve was grossly normal. Pulmonic valve regurgitation is not visualized. No evidence of pulmonic stenosis. Aorta: The aortic root and ascending aorta are structurally normal, with no evidence of dilitation. Venous: The right upper pulmonary vein is normal. The inferior vena cava is normal in size with greater than 50% respiratory variability, suggesting right atrial pressure of 3 mmHg. IAS/Shunts: The interatrial septum appears to be lipomatous. The atrial septum is grossly normal.  LEFT VENTRICLE PLAX 2D LVIDd:  5.50 cm         Diastology  LVIDs:         4.00 cm         LV e' medial:    4.03 cm/s LV PW:         1.10 cm         LV E/e' medial:  19.1 LV IVS:        1.20 cm         LV e' lateral:   4.68 cm/s LVOT diam:     1.90 cm         LV E/e' lateral: 16.5 LV SV:         64 LV SV Index:   32              2D LVOT Area:     2.84 cm        Longitudinal                                Strain                                2D Strain GLS  -18.8 % LV Volumes (MOD)               Avg: LV vol d, MOD    114.0 ml A2C:                           3D Volume EF LV vol d, MOD    90.3 ml       LV 3D EF:    Left A4C:                                        ventricular LV vol s, MOD    50.0 ml                    ejection A2C:                                        fraction by LV vol s, MOD    37.5 ml                    3D volume A4C:                                        is 53 %. LV SV MOD A2C:   64.0 ml LV SV MOD A4C:   90.3 ml LV SV MOD BP:    56.0 ml       3D Volume EF:                                3D EF:        53 % RIGHT VENTRICLE             IVC RV S prime:     10.04 cm/s  IVC diam: 1.80 cm TAPSE (M-mode): 1.9 cm LEFT  ATRIUM             Index       RIGHT ATRIUM           Index LA diam:        4.10 cm 2.07 cm/m  RA Area:     13.10 cm LA Vol (A2C):   46.9 ml 23.67 ml/m RA Volume:   26.20 ml  13.22 ml/m LA Vol (A4C):   34.4 ml 17.36 ml/m LA Biplane Vol: 42.4 ml 21.40 ml/m  AORTIC VALVE LVOT Vmax:   107.00 cm/s LVOT Vmean:  75.500 cm/s LVOT VTI:    0.225 m  AORTA Ao Root diam: 3.10 cm Ao Asc diam:  3.20 cm MITRAL VALVE MV Area (PHT): 3.53 cm     SHUNTS MV Decel Time: 215 msec     Systemic VTI:  0.22 m MV E velocity: 77.10 cm/s   Systemic Diam: 1.90 cm MV A velocity: 127.00 cm/s MV E/A ratio:  0.61 Lennie Odor MD Electronically signed by Lennie Odor MD Signature Date/Time: 11/15/2020/12:14:06 PM    Final    Disposition   Pt is being discharged home today in good condition.  Follow-up Plans & Appointments   Call [facility] at  [phone] if any bleeding, swelling or drainage at cath site.  May shower, no tub baths for 48 hours for groin sticks. No lifting over 5 pounds for 3 days.  No Driving for 3 days  Heart Healthy diet  we adjusted your medications please note changes  Take asprin and brilinta daily, stopping could cause a heart attack.   Discharge Instructions    Amb Referral to Cardiac Rehabilitation   Complete by: As directed    Diagnosis: Coronary Stents   After initial evaluation and assessments completed: Virtual Based Care may be provided alone or in conjunction with Phase 2 Cardiac Rehab based on patient barriers.: Yes      Discharge Medications   Allergies as of 11/15/2020   No Known Allergies     Medication List    TAKE these medications   acetaminophen 325 MG tablet Commonly known as: TYLENOL Take 325-650 mg by mouth every 6 (six) hours as needed for mild pain (for pain).   aspirin 81 MG EC tablet Take 1 tablet (81 mg total) by mouth daily.   carvedilol 12.5 MG tablet Commonly known as: COREG Take 1 tablet (12.5 mg total) by mouth 2 (two) times daily with a meal. What changed:   medication strength  See the new instructions.   fenofibrate 160 MG tablet Take 1 tablet (160 mg total) by mouth daily. Start taking on: November 16, 2020   losartan 50 MG tablet Commonly known as: COZAAR Take 1 tablet (50 mg total) by mouth daily. Start taking on: November 16, 2020   nitroGLYCERIN 0.4 MG SL tablet Commonly known as: NITROSTAT Place 1 tablet (0.4 mg total) under the tongue every 5 (five) minutes x 3 doses as needed for chest pain.   rosuvastatin 40 MG tablet Commonly known as: CRESTOR Take 1 tablet (40 mg total) by mouth daily.   ticagrelor 90 MG Tabs tablet Commonly known as: BRILINTA Take 1 tablet (90 mg total) by mouth 2 (two) times daily.          Outstanding Labs/Studies   BMP  Duration of Discharge Encounter   Greater than 30 minutes including physician  time.  Lorelei Pont, PA 11/15/2020, 4:36 PM   I have examined the  patient and reviewed assessment and plan and discussed with patient.  Agree with above as stated.  Right radial site stable post PCI.  I stressed the importance of dual antiplatelet therapy to prevent stent thrombosis.Marland Kitchen.  He continues to vape in his attempts to stop smoking.  I would advise that he stop this as well.  He will need long-term lipid-lowering therapy, healthy diet and regular exercise.  Since his troponin was negative and his LV function was normal and cath was done from the radial approach, we made the decision that he could go home today.  Lance MussJayadeep Youcef Klas

## 2020-11-15 NOTE — Progress Notes (Signed)
  Echocardiogram 2D Echocardiogram has been performed.  Janalyn Harder 11/15/2020, 11:03 AM

## 2020-11-15 NOTE — Plan of Care (Signed)

## 2020-11-15 NOTE — Progress Notes (Signed)
Up and walked and tolerated well; no c/o pain 

## 2020-11-15 NOTE — Discharge Instructions (Signed)
Call [facility] at [phone] if any bleeding, swelling or drainage at cath site.  May shower, no tub baths for 48 hours for groin sticks. No lifting over 5 pounds for 3 days.  No Driving for 3 days  Heart Healthy diet  we adjusted your medications please note changes  Take asprin and brilinta daily, stopping could cause a heart attack.     Radial Site Care  ELEVATE WRIST 24 HOURS   This sheet gives you information about how to care for yourself after your procedure. Your health care provider may also give you more specific instructions. If you have problems or questions, contact your health care provider. What can I expect after the procedure? After the procedure, it is common to have:  Bruising and tenderness at the catheter insertion area. Follow these instructions at home: Medicines  Take over-the-counter and prescription medicines only as told by your health care provider. Insertion site care  Follow instructions from your health care provider about how to take care of your insertion site. Make sure you: ? Wash your hands with soap and water before you change your bandage (dressing). If soap and water are not available, use hand sanitizer. ? Change your dressing as told by your health care provider. ? Leave stitches (sutures), skin glue, or adhesive strips in place. These skin closures may need to stay in place for 2 weeks or longer. If adhesive strip edges start to loosen and curl up, you may trim the loose edges. Do not remove adhesive strips completely unless your health care provider tells you to do that.  Check your insertion site every day for signs of infection. Check for: ? Redness, swelling, or pain. ? Fluid or blood. ? Pus or a bad smell. ? Warmth.  Do not take baths, swim, or use a hot tub until your health care provider approves.  You may shower 24-48 hours after the procedure, or as directed by your health care provider. ? Remove the  dressing and gently wash the site with plain soap and water. ? Pat the area dry with a clean towel. ? Do not rub the site. That could cause bleeding.  Do not apply powder or lotion to the site. Activity  For 24 hours after the procedure, or as directed by your health care provider: ? Do not flex or bend the affected arm. ? Do not push or pull heavy objects with the affected arm. ? Do not drive yourself home from the hospital or clinic. You may drive 24 hours after the procedure unless your health care provider tells you not to. ? Do not operate machinery or power tools.  Do not lift anything that is heavier than 10 lb (4.5 kg), or the limit that you are told, until your health care provider says that it is safe.  Ask your health care provider when it is okay to: ? Return to work or school. ? Resume usual physical activities or sports. ? Resume sexual activity.   General instructions  If the catheter site starts to bleed, raise your arm and put firm pressure on the site. If the bleeding does not stop, get help right away. This is a medical emergency.  If you went home on the same day as your procedure, a responsible adult should be with you for the first 24 hours after you arrive home.  Keep all follow-up visits as told by your health care provider. This is important. Contact a health care provider  if:  You have a fever.  You have redness, swelling, or yellow drainage around your insertion site. Get help right away if:  You have unusual pain at the radial site.  The catheter insertion area swells very fast.  The insertion area is bleeding, and the bleeding does not stop when you hold steady pressure on the area.  Your arm or hand becomes pale, cool, tingly, or numb. These symptoms may represent a serious problem that is an emergency. Do not wait to see if the symptoms will go away. Get medical help right away. Call your local emergency services (911 in the [place] Do not drive  yourself to the hospital. Summary  After the procedure, it is common to have bruising and tenderness at the site.  Follow instructions from your health care provider about how to take care of your radial site wound. Check the wound every day for signs of infection.  Do not lift anything that is heavier than 10 lb (4.5 kg), or the limit that you are told, until your health care provider says that it is safe. This information is not intended to replace advice given to you by your health care provider. Make sure you discuss any questions you have with your health care provider. Document Revised: 09/24/2017 Document Reviewed: 09/24/2017 Elsevier Patient Education  2021 [employer].

## 2020-11-15 NOTE — Interval H&P Note (Signed)
Cath Lab Visit (complete for each Cath Lab visit)  Clinical Evaluation Leading to the Procedure:   ACS: Yes.    Non-ACS:    Anginal Classification: CCS IV  Anti-ischemic medical therapy: Minimal Therapy (1 class of medications)  Non-Invasive Test Results: No non-invasive testing performed  Prior CABG: No previous CABG  Known CTO of RCA.    History and Physical Interval Note:  11/15/2020 12:39 PM  Barry Gutierrez  has presented today for surgery, with the diagnosis of unstable angina.  The various methods of treatment have been discussed with the patient and family. After consideration of risks, benefits and other options for treatment, the patient has consented to  Procedure(s): LEFT HEART CATH AND CORONARY ANGIOGRAPHY (N/A) as a surgical intervention.  The patient's history has been reviewed, patient examined, no change in status, stable for surgery.  I have reviewed the patient's chart and labs.  Questions were answered to the patient's satisfaction.     Lance Muss

## 2020-11-15 NOTE — H&P (View-Only) (Signed)
Progress Note  Patient Name: Barry Gutierrez Date of Encounter: 11/15/2020  [facility] Cardiologist: Parke Poisson, MD   Subjective   No SOB  Inpatient Medications    Scheduled Meds: . aspirin EC  81 mg Oral Daily  . carvedilol  12.5 mg Oral BID WC  . losartan  25 mg Oral Daily  . rosuvastatin  40 mg Oral Daily  . sodium chloride flush  3 mL Intravenous Q12H   Continuous Infusions: . sodium chloride    . sodium chloride 1 mL/kg/hr (11/15/20 0518)  . heparin 1,300 Units/hr (11/15/20 0943)   PRN Meds: sodium chloride, acetaminophen, nitroGLYCERIN, ondansetron (ZOFRAN) IV, sodium chloride flush   Vital Signs    Vitals:   11/14/20 2049 11/14/20 2357 11/15/20 0315 11/15/20 0805  BP: (!) 164/84 (!) 153/92 132/84 (!) 166/84  Pulse: 89 78 76 70  Resp: 20 17 19 19   Temp: 99.5 F (37.5 C) 98.7 F (37.1 C) 98.3 F (36.8 C) 98.2 F (36.8 C)  TempSrc: Oral Oral Oral Oral  SpO2: 99% 98% 100% 99%  Weight: 81.9 kg  80.3 kg   Height: 5\' 10"  (1.778 m)       Intake/Output Summary (Last 24 hours) at 11/15/2020 0949 Last data filed at 11/15/2020 0600 Gross per 24 hour  Intake 247.78 ml  Output 300 ml  Net -52.22 ml   Last 3 Weights 11/15/2020 11/14/2020 11/14/2020  Weight (lbs) 177 lb 180 lb 8 oz 180 lb  Weight (kg) 80.287 kg 81.874 kg 81.647 kg      Telemetry    SR - Personally Reviewed  ECG    SR with Qtc 480 ms no acute ST changes - Personally Reviewed  Physical Exam  Exam per Dr. 11/16/2020 GEN: No acute distress.   Neck: No JVD Cardiac: RRR, no murmurs, rubs, or gallops.  Respiratory: Clear to auscultation bilaterally. GI: Soft, nontender, non-distended  MS: No edema; No deformity. Neuro:  Nonfocal  Psych: Normal affect   Labs    High Sensitivity Troponin:   Recent Labs  Lab 11/14/20 1257 11/14/20 1541  TROPONINIHS 10 8      Chemistry Recent Labs  Lab 11/14/20 1257 11/15/20 0012  NA 135 134*  K 3.6 3.3*  CL 104 102  CO2 22 24  GLUCOSE  103* 126*  BUN 7 8  CREATININE 0.82 0.81  CALCIUM 9.2 8.8*  GFRNONAA >60 >60  ANIONGAP 9 8     Hematology Recent Labs  Lab 11/14/20 1257 11/15/20 0012  WBC 5.6 6.1  RBC 4.57 4.28  HGB 15.3 14.2  HCT 43.8 41.0  MCV 95.8 95.8  MCH 33.5 33.2  MCHC 34.9 34.6  RDW 12.5 12.5  PLT 135* 120*    BNPNo results for input(s): BNP, PROBNP in the last 168 hours.   DDimer No results for input(s): DDIMER in the last 168 hours.   Radiology    DG Chest 2 View  Result Date: 11/14/2020 CLINICAL DATA:  Venous of breath, dizziness, fatigue, history coronary artery disease post MI, smoker, hypertension EXAM: CHEST - 2 VIEW COMPARISON:  06/09/2020 FINDINGS: Normal heart size, mediastinal contours, and pulmonary vascularity. Lungs clear. No pulmonary infiltrate, pleural effusion, or pneumothorax. Atherosclerotic calcifications aorta. Bones unremarkable. IMPRESSION: No acute abnormalities. Aortic Atherosclerosis (ICD10-I70.0). Electronically Signed   By: 11/16/2020 M.D.   On: 11/14/2020 13:44    Cardiac Studies   Echo and cardiac cath today  Patient Profile     60 y.o. male history  of CAD, non-STEMI in December 2020 with cardiac cath showing mid RCA lesion is 100% stenosed. Prox RCA lesion is 40% stenosed. Ost Cx to Prox Cx lesion is 40% stenosed.  It was concluded that it is severe one-vessel coronary artery disease with occluded mid right coronary artery with good left-to-right collaterals. This is likely chronic over acute on chronic.   Assessment & Plan    Unstable angina for cardiac cath today neg troponin   CAD with occluded mid RCA with left-to-right collaterals, no worsening symptoms, we will plan for cardiac catheterization tomorrow, start heparin drip, continue aspirin atorvastatin 80 mg daily, increase carvedilol to 12.5 mg daily, add losartan 25 mg daily  HTN see above  HLD on crestor, for now lipitor 80  LDL 49  Hypokalemia replaced with 40 meq stat  For questions or  updates, please contact [facility] Please consult [email or web address] for contact info under     Signed, Nada Boozer, NP  11/15/2020, 9:49 AM    The patient was seen, examined and discussed with Nada Boozer, NP and I agree with the above.   Cath scheduled for today, crea normal, continue heparin drip for unstable angina, BP remains elevated, I will increase losartan to 50 mg po daily. Add fenofibrate for TG of 318.   Tobias Alexander, MD 11/15/2020

## 2020-11-16 ENCOUNTER — Encounter (HOSPITAL_COMMUNITY): Payer: Self-pay | Admitting: Interventional Cardiology

## 2020-11-20 ENCOUNTER — Telehealth (HOSPITAL_COMMUNITY): Payer: Self-pay

## 2020-11-20 NOTE — Telephone Encounter (Signed)
Pt insurance is active and benefits verified through [employer] Co-pay 0, DED $1,400/$13.20 met, out of pocket $2,600/$13.20 met, co-insurance 10%. no pre-authorization required. Passport, 11/20/2020_0 :01pm, REF# [id number]  Will contact patient to see if he is interested in the Cardiac Rehab Program. If interested, patient will need to complete follow up appt. Once completed, patient will be contacted for scheduling upon review by the RN Navigator.

## 2020-11-29 ENCOUNTER — Ambulatory Visit: Payer: BC Managed Care – PPO | Admitting: Medical

## 2020-11-30 ENCOUNTER — Ambulatory Visit (INDEPENDENT_AMBULATORY_CARE_PROVIDER_SITE_OTHER): Payer: BC Managed Care – PPO | Admitting: Internal Medicine

## 2020-11-30 ENCOUNTER — Other Ambulatory Visit: Payer: Self-pay

## 2020-11-30 VITALS — BP 142/78 | HR 70 | Ht 70.0 in | Wt 184.0 lb

## 2020-11-30 DIAGNOSIS — I10 Essential (primary) hypertension: Secondary | ICD-10-CM | POA: Diagnosis not present

## 2020-11-30 DIAGNOSIS — I200 Unstable angina: Secondary | ICD-10-CM | POA: Diagnosis not present

## 2020-11-30 DIAGNOSIS — E781 Pure hyperglyceridemia: Secondary | ICD-10-CM

## 2020-11-30 DIAGNOSIS — I2510 Atherosclerotic heart disease of native coronary artery without angina pectoris: Secondary | ICD-10-CM | POA: Diagnosis not present

## 2020-11-30 DIAGNOSIS — Z95820 Peripheral vascular angioplasty status with implants and grafts: Secondary | ICD-10-CM | POA: Diagnosis not present

## 2020-11-30 MED ORDER — LOSARTAN POTASSIUM 50 MG PO TABS: 75.0000 mg | ORAL_TABLET | Freq: Every day | ORAL | 3 refills | Status: DC

## 2020-11-30 NOTE — Progress Notes (Signed)
Cardiology Office Note:    Date:  11/30/2020   ID:  Barry Gutierrez, DOB 04-08-1961, MRN [id number]  PCP:  Patient, No Pcp Per (Inactive)  Cardiologist:  Parke Poisson, MD  Electrophysiologist:  None   Referring MD: No ref. provider found   Chief Complaint/Reason for Referral: Unstable angina status post PCI to the RCA  History of Present Illness:    Barry Gutierrez is a 60 y.o. male with a history of CAD and prior NSTEMI with occluded RCA, hypertension, hyperlipidemia, recently quit tobacco use, and alcohol abuse.  Presents for follow-up after unstable angina requiring coronary catheterization PCI to the RCA which had somewhat recanalized but was subtotally occluded.  He received 2 stents to the RCA in overlapping fashion, proximal and mid RCA.  He is tolerating dual antiplatelet therapy but is taking a full strength 325 mg aspirin in addition to taking additional aspirin throughout the day for headaches.  I have cautioned him against taking anything more than 81 mg aspirin in conjunction with Brilinta. I have instructed the patient that dual antiplatelet therapy should be taken for 1 year without interruption.  We have discussed the consequences of interrupted dual antiplatelet therapy and the risk for in-stent thrombosis.  He is tolerating Brilinta well without bleeding, bruising, or shortness of breath.  He is otherwise tolerating beta-blocker, ARB, and statin without issue.  We reviewed his lipid panel which shows hypertriglyceridemia.  He asked if this was related to alcohol use.  We had a long discussion about the dangers of excessive alcohol consumption in the setting of dual antiplatelet therapy, statin use, and overall health.  He will attempt to reduce his alcohol consumption by himself.  I have offered him the support of social work in our office.  He has only had to take nitroglycerin once since his PCI, and overall has no significant chest pain.  He continues to have a  left-sided headache.  I have recommended that he seek out PCP and neurology evaluation for this.  He had not yet tried Tylenol.    Past Medical History:  Diagnosis Date  . Allergy   . MI (myocardial infarction) (HCC)   . S/P angioplasty with stent DES to RCA 11/15/20 11/15/2020    Past Surgical History:  Procedure Laterality Date  . CORONARY STENT INTERVENTION N/A 11/15/2020   Procedure: CORONARY STENT INTERVENTION;  Surgeon: Corky Crafts, MD;  Location: [facility]VASIVE CV LAB;  Service: Cardiovascular;  Laterality: N/A;  . LEFT HEART CATH AND CORONARY ANGIOGRAPHY N/A 08/04/2019   Procedure: LEFT HEART CATH AND CORONARY ANGIOGRAPHY;  Surgeon: Iran Ouch, MD;  Location: [facility] INVASIVE CV LAB;  Service: Cardiovascular;  Laterality: N/A;  . LEFT HEART CATH AND CORONARY ANGIOGRAPHY N/A 11/15/2020   Procedure: LEFT HEART CATH AND CORONARY ANGIOGRAPHY;  Surgeon: Corky Crafts, MD;  Location: [facility] INVASIVE CV LAB;  Service: Cardiovascular;  Laterality: N/A;    Current Medications: Current Meds  Medication Sig  . acetaminophen (TYLENOL) 325 MG tablet Take 325-650 mg by mouth every 6 (six) hours as needed for mild pain (for pain).  Marland Kitchen aspirin EC 81 MG EC tablet Take 1 tablet (81 mg total) by mouth daily.  . carvedilol (COREG) 12.5 MG tablet Take 1 tablet (12.5 mg total) by mouth 2 (two) times daily with a meal.  . fenofibrate 160 MG tablet Take 1 tablet (160 mg total) by mouth daily.  . nitroGLYCERIN (NITROSTAT) 0.4 MG SL tablet Place 1 tablet (0.4 mg total)  under the tongue every 5 (five) minutes x 3 doses as needed for chest pain.  . rosuvastatin (CRESTOR) 40 MG tablet Take 1 tablet (40 mg total) by mouth daily.  . ticagrelor (BRILINTA) 90 MG TABS tablet Take 1 tablet (90 mg total) by mouth 2 (two) times daily.  . [DISCONTINUED] losartan (COZAAR) 50 MG tablet Take 1 tablet (50 mg total) by mouth daily.     Allergies:   Patient has no known allergies.   Social History   Tobacco  Use  . Smoking status: Current Every Day Smoker    Packs/day: 2.00    Types: Cigarettes  . Smokeless tobacco: Never Used  Substance Use Topics  . Alcohol use: Yes  . Drug use: Yes    Types: Marijuana    Comment: every other month or so      Family History: The patient's family history includes CAD in his father and mother; Cancer in his mother; Coronary artery disease in his father and mother; Diabetes in his father.  ROS:   Please see the history of present illness.    All other systems reviewed and are negative.  EKGs/Labs/Other Studies Reviewed:    The following studies were reviewed today:  EKG: Sinus rhythm, LVH criteria  I have independently reviewed the images from left heart cath 11/15/2020.  Recent Labs: 11/15/2020: BUN 8; Creatinine, Ser 0.81; Hemoglobin 14.2; Platelets 120; Potassium 3.3; Sodium 134  Recent Lipid Panel    Component Value Date/Time   CHOL 177 11/15/2020 0014   TRIG 318 (H) 11/15/2020 0014   HDL 64 11/15/2020 0014   CHOLHDL 2.8 11/15/2020 0014   VLDL 64 (H) 11/15/2020 0014   LDLCALC 49 11/15/2020 0014    Physical Exam:    VS:  BP (!) 142/78   Pulse 70   Ht 5\' 10"  (1.778 m)   Wt 184 lb (83.5 kg)   BMI 26.40 kg/m     Wt Readings from Last 5 Encounters:  11/30/20 184 lb (83.5 kg)  11/15/20 177 lb (80.3 kg)  09/21/20 187 lb 6.4 oz (85 kg)  06/09/20 180 lb (81.6 kg)  11/14/19 195 lb (88.5 kg)    Constitutional: No acute distress Eyes: sclera non-icteric, normal conjunctiva and lids ENMT: normal dentition, moist mucous membranes Cardiovascular: regular rhythm, normal rate, no murmurs. S1 and S2 normal. Radial pulses normal bilaterally. No jugular venous distention.  Respiratory: clear to auscultation bilaterally GI : normal bowel sounds, soft and nontender. No distention.   MSK: extremities warm, well perfused. No edema.  NEURO: grossly nonfocal exam, moves all extremities. PSYCH: alert and oriented x 3, normal mood and affect.    ASSESSMENT:    1. Unstable angina (HCC)   2. S/P angioplasty with stent   3. Coronary artery disease involving native coronary artery of native heart without angina pectoris   4. Essential hypertension   5. Hypertriglyceridemia    PLAN:    Unstable angina (HCC) S/P angioplasty with stent - Plan: EKG 12-Lead Coronary artery disease involving native coronary artery of native  heart without angina pectoris -Continue dual antiplatelet therapy. I have instructed the patient that dual antiplatelet therapy should be taken for 1 year without interruption.  We have discussed the consequences of interrupted dual antiplatelet therapy and the risk for in-stent thrombosis.  -Do not take more than 81 mg of aspirin daily in combination with Brilinta. -Continue beta-blocker, ARB, statin  Essential hypertension -blood pressure mildly elevated today and was elevated in the hospital as well.  Increase losartan to 75 mg daily.  Hypertriglyceridemia-discussed alcohol use in detail.  He is on statin and fibrate.  We will recheck lipids at 3 months from last lipid panel, he can hopefully cut down on his alcohol consumption significantly by that time.  Total time of encounter: 30 minutes total time of encounter, including 20 minutes spent in face-to-face patient care on the date of this encounter. This time includes coordination of care and counseling regarding above mentioned problem list. Remainder of non-face-to-face time involved reviewing chart documents/testing relevant to the patient encounter and documentation in the medical record. I have independently reviewed documentation from referring provider.   [name], MD, [facility]  [facility]    Medication Adjustments/Labs and Tests Ordered: Current medicines are reviewed at length with the patient today.  Concerns regarding medicines are outlined above.   Orders Placed This Encounter  Procedures  . EKG 12-Lead    Meds ordered  this encounter  Medications  . losartan (COZAAR) 50 MG tablet    Sig: Take 1.5 tablets (75 mg total) by mouth daily.    Dispense:  45 tablet    Refill:  3    Patient Instructions  Medication Instructions:  INCREASE LOSARTAN TO 75mg  (1.5 TABLETS) DAILY *If you need a refill on your cardiac medications before your next appointment, please call your pharmacy*  Follow-Up: At [facility], you and your health needs are our priority.  As part of our continuing mission to provide you with exceptional heart care, we have created designated Provider Care Teams.  These Care Teams include your primary Cardiologist (physician) and Advanced Practice Providers (APPs -  Physician Assistants and Nurse Practitioners) who all work together to provide you with the care you need, when you need it.  Your next appointment:   6 week(s)  The format for your next appointment:   In Person  Provider:   [facility], MD  Other Instructions PLEASE TAKE 81mg  ASPIRIN DAILY FOR YOUR STENT, AND PLEASE CONTINUE TO TAKE BRILLINTA 90mg  TWICE DAILY FOR 1 YEAR.

## 2020-11-30 NOTE — Patient Instructions (Addendum)
Medication Instructions:  INCREASE LOSARTAN TO 75mg  (1.5 TABLETS) DAILY *If you need a refill on your cardiac medications before your next appointment, please call your pharmacy*  Follow-Up: At [facility], you and your health needs are our priority.  As part of our continuing mission to provide you with exceptional heart care, we have created designated Provider Care Teams.  These Care Teams include your primary Cardiologist (physician) and Advanced Practice Providers (APPs -  Physician Assistants and Nurse Practitioners) who all work together to provide you with the care you need, when you need it.  Your next appointment:   6 week(s)  The format for your next appointment:   In Person  Provider:   [facility], MD  Other Instructions PLEASE TAKE 81mg  ASPIRIN DAILY FOR YOUR STENT, AND PLEASE CONTINUE TO TAKE BRILLINTA 90mg  TWICE DAILY FOR 1 YEAR.

## 2020-12-11 ENCOUNTER — Telehealth: Payer: Self-pay | Admitting: Internal Medicine

## 2020-12-11 NOTE — Telephone Encounter (Signed)
*  STAT* If patient is at the pharmacy, call can be transferred to refill team.   1. Which medications need to be refilled? (please list name of each medication and dose if known)  ticagrelor (BRILINTA) 90 MG TABS tablet fenofibrate 160 MG tablet  2. Which pharmacy/location (including street and city if local pharmacy) is medication to be sent to? [id number] - [place], [place] - [address] AT [facility] [address]  3. Do they need a 30 day or 90 day supply? 90 day  Patient is out of medication

## 2020-12-14 ENCOUNTER — Telehealth: Payer: Self-pay | Admitting: Internal Medicine

## 2020-12-14 MED ORDER — CARVEDILOL 12.5 MG PO TABS: 12.5000 mg | ORAL_TABLET | Freq: Two times a day (BID) | ORAL | 3 refills | Status: DC

## 2020-12-14 MED ORDER — TICAGRELOR 90 MG PO TABS: 90.0000 mg | ORAL_TABLET | Freq: Two times a day (BID) | ORAL | 3 refills | Status: DC

## 2020-12-14 MED ORDER — FENOFIBRATE 160 MG PO TABS: ORAL_TABLET | Freq: Every day | ORAL | 3 refills | Status: DC

## 2020-12-14 NOTE — Telephone Encounter (Signed)
*  STAT* If patient is at the pharmacy, call can be transferred to refill team.   1. Which medications need to be refilled? (please list name of each medication and dose if known) carvedilol (COREG) 12.5 MG tablet fenofibrate 160 MG tablet relor (BRILINTA) 90 MG TABS tablet  2. Which pharmacy/location (including street and city if local pharmacy) is medication to be sent to? [facility] #[id number] - [place], [place] - [address] AT [facility] OF [address]  3. Do they need a 30 day or 90 day supply? 90 day supply

## 2021-01-15 DIAGNOSIS — Z23 Encounter for immunization: Secondary | ICD-10-CM | POA: Diagnosis not present

## 2021-01-15 DIAGNOSIS — L03113 Cellulitis of right upper limb: Secondary | ICD-10-CM | POA: Diagnosis not present

## 2021-01-15 IMAGING — DX DG CHEST 1V PORT
1 series · 1 of 1 positions shown · non-contrast
Comparison: 08/02/2019.

CLINICAL DATA: COVID.  Body aches.  Shortness of breath.  Cough.

EXAM:
PORTABLE CHEST 1 VIEW

[chest ap]
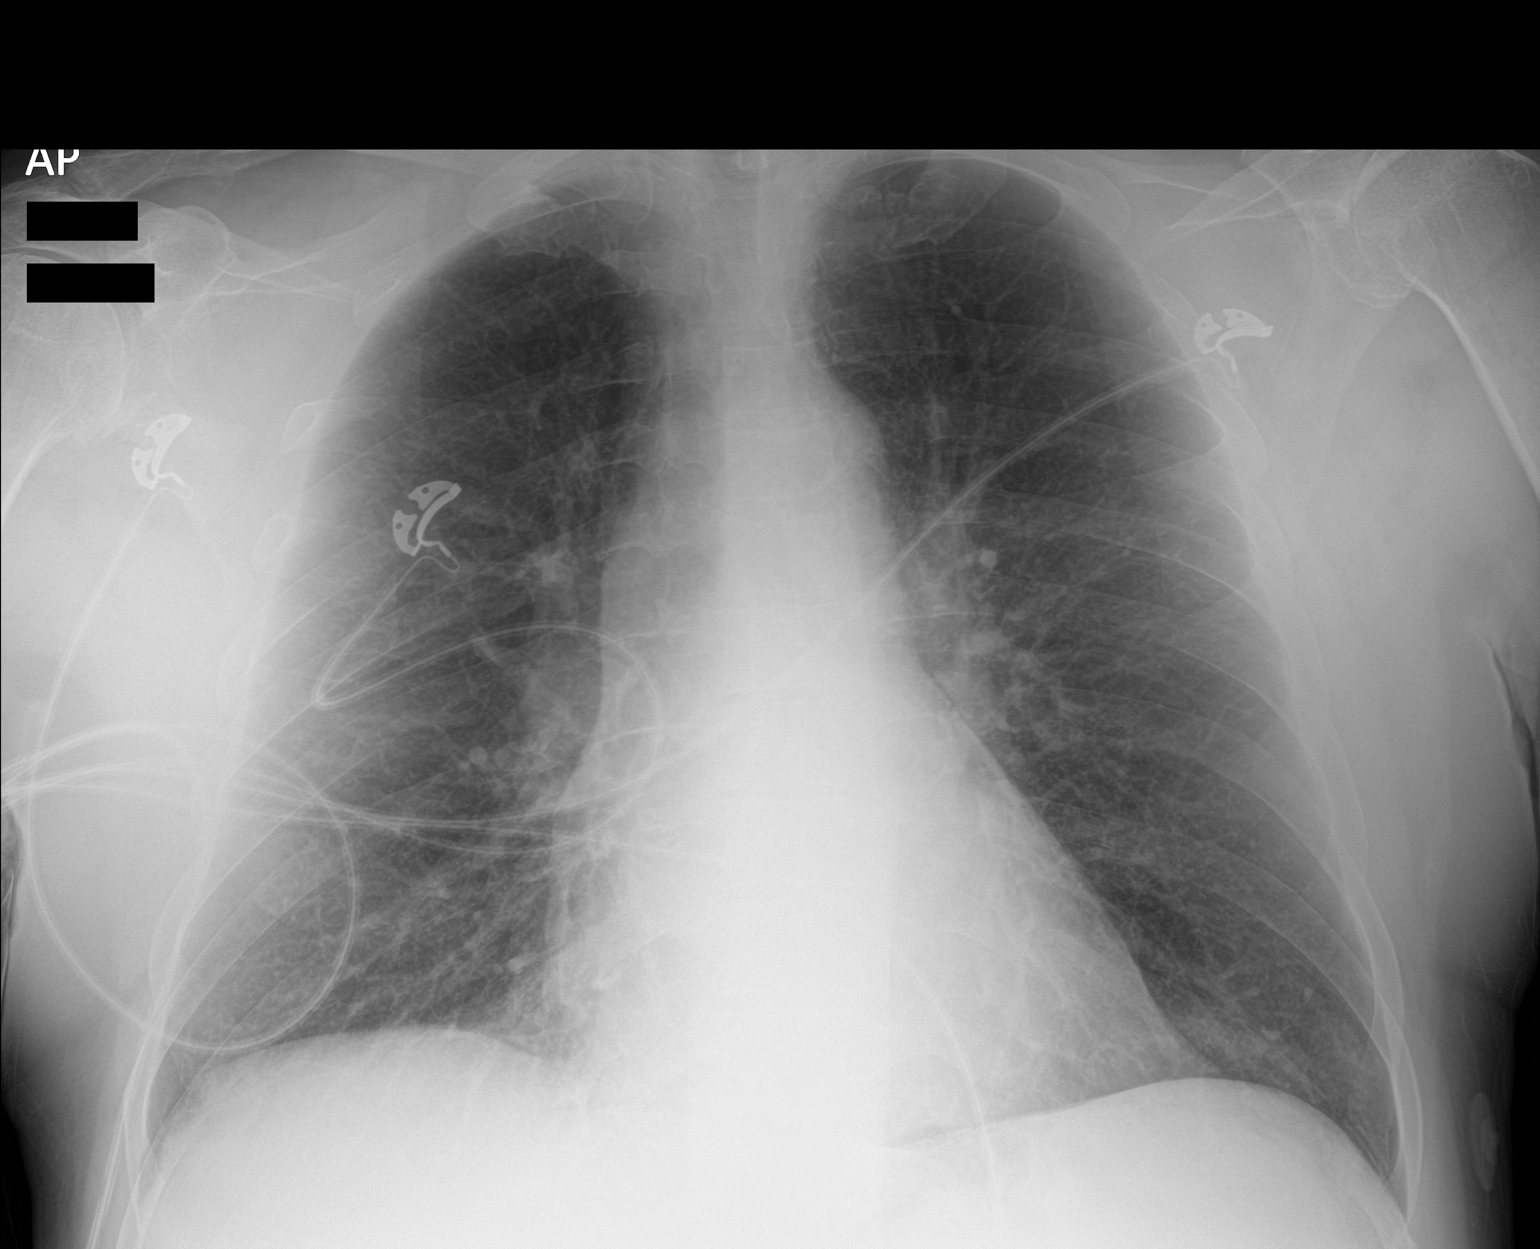

[1 of 1 positions shown; findings below may reference images not displayed]

FINDINGS: Heart size normal. No focal infiltrate. Mild bibasilar subsegmental
atelectasis. No pleural effusion or pneumothorax. Degenerative
change thoracic spine.
IMPRESSION: Mild bibasilar subsegmental atelectasis. No acute infiltrate noted.
Chest is stable from prior study of 08/02/2019.

## 2021-02-23 IMAGING — DX DG CHEST 1V PORT
1 series · 1 of 1 positions shown · non-contrast
Comparison: 09/21/2019

CLINICAL DATA: Syncope, possible overdose

EXAM:
PORTABLE CHEST 1 VIEW

[chest ap]
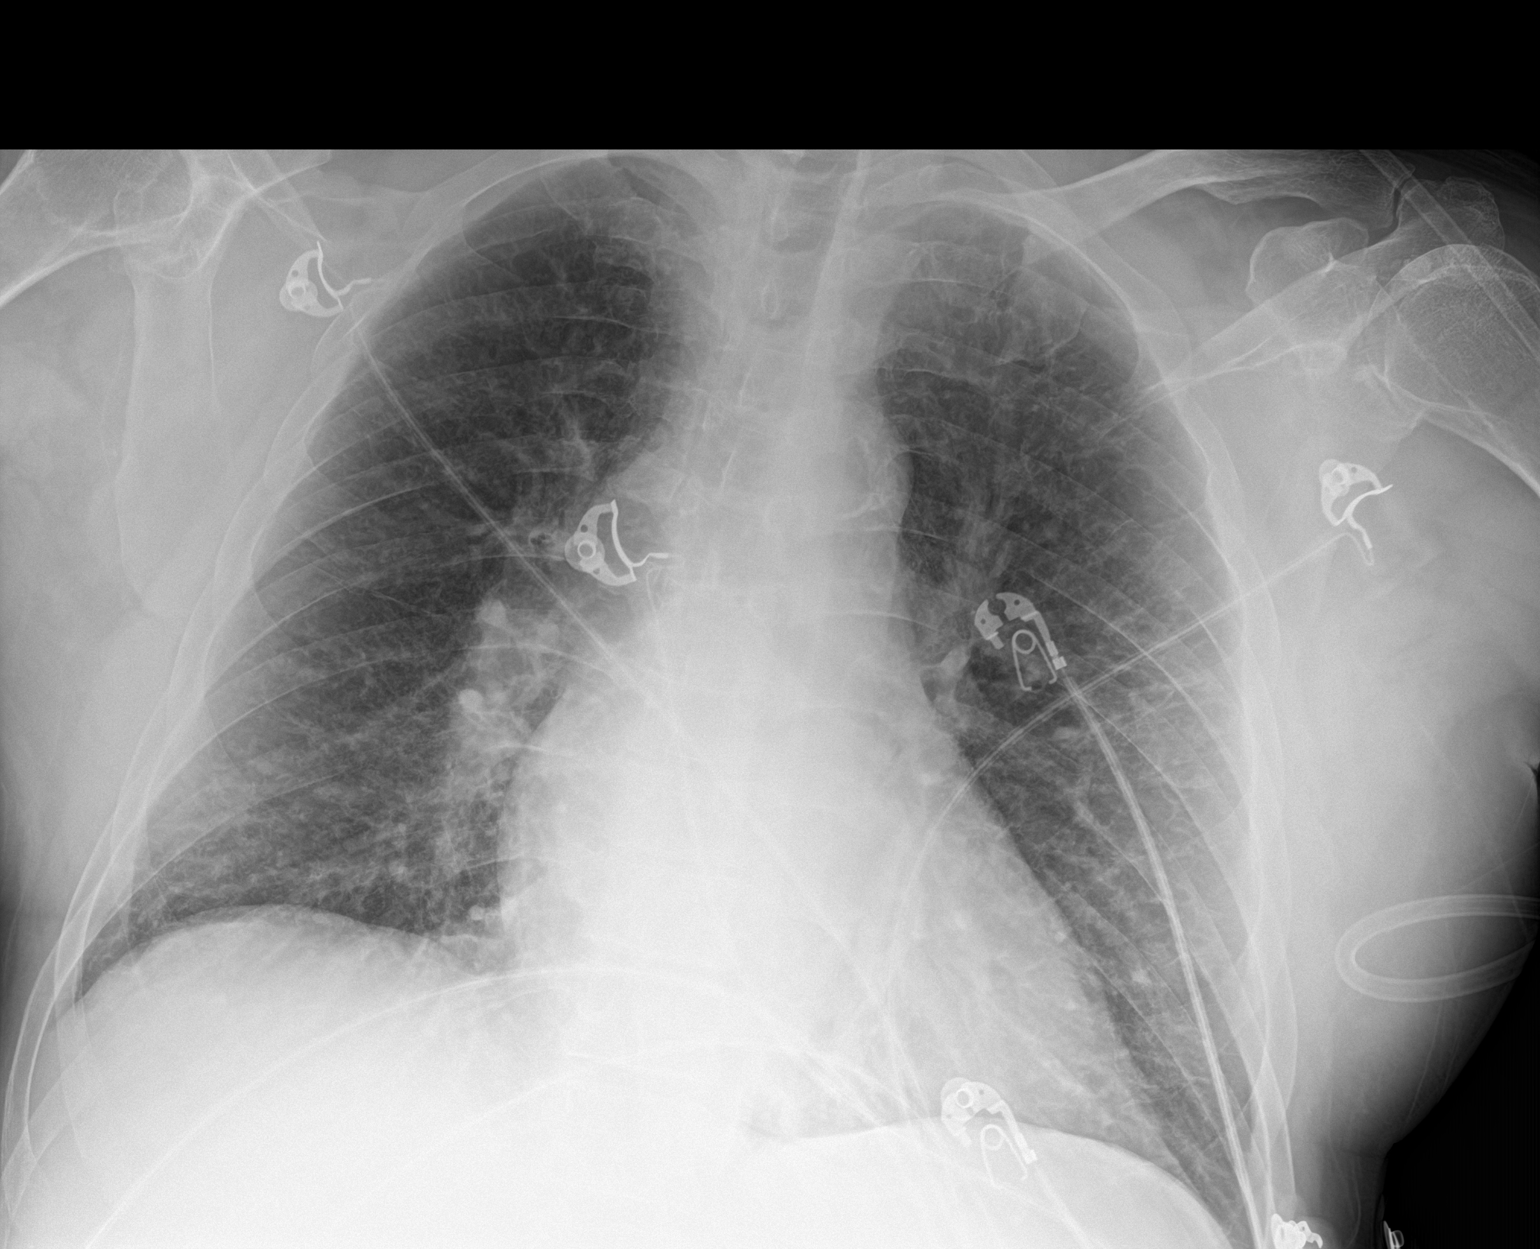

[1 of 1 positions shown; findings below may reference images not displayed]

FINDINGS: Single frontal view of the chest demonstrates an unremarkable
cardiac silhouette. Chronic diffuse interstitial prominence without
airspace disease, effusion, or pneumothorax. No acute bony
abnormalities.
IMPRESSION: 1. Chronic interstitial lung disease.  No acute process.

## 2021-02-27 ENCOUNTER — Ambulatory Visit: Payer: BC Managed Care – PPO | Admitting: Internal Medicine

## 2021-03-26 NOTE — Progress Notes (Signed)
Cardiology Office Note:    Date:  03/28/2021   ID:  Kinnie Scales Leonardo, DOB [birth year], MRN [id number]  PCP:  Patient, No Pcp Per (Inactive)  Cardiologist:  Parke Poisson, MD  Electrophysiologist:  None   Referring MD: No ref. provider found     Chief Complaint/Reason for Referral: Unstable angina status post PCI to the RCA  History of Present Illness:    Barry Gutierrez is a 60 y.o. male with a history of CAD and prior NSTEMI with occluded RCA, hypertension, hyperlipidemia, recently quit tobacco use, and alcohol abuse.  Presents for follow-up after unstable angina requiring coronary catheterization PCI to the RCA which had somewhat recanalized but was subtotally occluded.  He received 2 stents to the RCA in overlapping fashion, proximal and mid RCA.  Denies chest pain since her last visit.  Does endorse shortness of breath even while lying down at night.  We discussed that this could be a Brilinta side effect and discussed transitioning to Plavix.  We will plan for 300 mg load tomorrow and initiating 75 mg of Plavix starting 03/30/2021.  I have emphasized no missed doses is critical.  We will continue Plavix and aspirin and dual antiplatelet therapy until at least March 2023.  I have asked him to call or write in after a few weeks to see if his shortness of breath is improved.  If not we may need to consider other sources of shortness of breath.  He has not been consistently taking his aspirin since he ran out.  I emphasized that dual antiplatelet therapy in the first year is critical particularly with overlapping stents and multiple MIs.  I have encouraged him to resume aspirin and we will provide samples for 81 mg aspirin today.  He otherwise endorses fatigue and does not follow with a primary care doctor.  I have offered to perform basic laboratory studies to this effect but have encouraged him to establish with community health and wellness for primary care as one of our trusted health  partners.  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  He notes that he is fatigued more so and feels this could be due to deconditioning.  He has quit smoking and decreased his drinking to what he feels is a manageable level.  I have encouraged him to continue lifestyle modification.  He eats quite a bit of salty food and we have discussed impact of salt on blood pressure and volume retention.   The patient denies chest pain, chest pressure, palpitations, PND, orthopnea, or leg swelling. Denies cough, fever, chills. Denies nausea, vomiting. Denies syncope or presyncope. Denies dizziness or lightheadedness. Denies snoring.   Past Medical History:  Diagnosis Date   Allergy    MI (myocardial infarction) (HCC)    S/P angioplasty with stent DES to RCA 11/15/20 11/15/2020    Past Surgical History:  Procedure Laterality Date   CORONARY STENT INTERVENTION N/A 11/15/2020   Procedure: CORONARY STENT INTERVENTION;  Surgeon: Corky Crafts, MD;  [facility] INVASIVE CV LAB;  Service: Cardiovascular;  Laterality: N/A;   LEFT HEART CATH AND CORONARY ANGIOGRAPHY N/A 08/04/2019   Procedure: LEFT HEART CATH AND CORONARY ANGIOGRAPHY;  Surgeon: Iran Ouch, MD;  Location: [facility] INVASIVE CV LAB;  Service: Cardiovascular;  Laterality: N/A;   LEFT HEART CATH AND CORONARY ANGIOGRAPHY N/A 11/15/2020   Procedure: LEFT HEART CATH AND CORONARY ANGIOGRAPHY;  Surgeon: Corky Crafts, MD;  Location: [facility]  INVASIVE CV LAB;  Service: Cardiovascular;  Laterality: N/A;    Current Medications: Current Meds  Medication Sig   acetaminophen (TYLENOL) 325 MG tablet Take 325-650 mg by mouth every 6 (six) hours as needed for mild pain (for pain).   aspirin EC 81 MG EC tablet Take 1 tablet (81 mg total) by mouth daily.   carvedilol (COREG) 12.5 MG tablet Take 1 tablet (12.5 mg total) by mouth 2 (two) times daily with a meal.   fenofibrate 160 MG tablet Take 1 tablet (160  mg total) by mouth daily.   losartan (COZAAR) 50 MG tablet TAKE 1 TABLET (50 MG TOTAL) BY MOUTH DAILY.   nitroGLYCERIN (NITROSTAT) 0.4 MG SL tablet Place 1 tablet (0.4 mg total) under the tongue every 5 (five) minutes x 3 doses as needed for chest pain.   rosuvastatin (CRESTOR) 40 MG tablet Take 1 tablet (40 mg total) by mouth daily.   ticagrelor (BRILINTA) 90 MG TABS tablet Take 1 tablet (90 mg total) by mouth 2 (two) times daily.     Allergies:   Patient has no known allergies.   Social History   Tobacco Use   Smoking status: Every Day    Packs/day: 2.00    Types: Cigarettes   Smokeless tobacco: Never  Substance Use Topics   Alcohol use: Yes   Drug use: Yes    Types: Marijuana    Comment: every other month or so      Family History: The patient's family history includes CAD in his father and mother; Cancer in his mother; Coronary artery disease in his father and mother; Diabetes in his father.  ROS:   Please see the history of present illness.    (+) All other systems reviewed and are negative.  EKGs/Labs/Other Studies Reviewed:    The following studies were reviewed today:  EKG:  03/28/2021: Normal sinus rhythm rate 67, probable LVH criteria 11/30/2020: Sinus rhythm, LVH criteria I have independently reviewed the images from left heart cath 11/15/2020.  Recent Labs: 11/15/2020: BUN 8; Creatinine, Ser 0.81; Hemoglobin 14.2; Platelets 120; Potassium 3.3; Sodium 134  Recent Lipid Panel    Component Value Date/Time   CHOL 177 11/15/2020 0014   TRIG 318 (H) 11/15/2020 0014   HDL 64 11/15/2020 0014   CHOLHDL 2.8 11/15/2020 0014   VLDL 64 (H) 11/15/2020 0014   LDLCALC 49 11/15/2020 0014    Physical Exam:    VS:  BP 128/78   Pulse 67   Ht 5\' 10"  (1.778 m)   Wt 197 lb 12.8 oz (89.7 kg)   SpO2 97%   BMI 28.38 kg/m     Wt Readings from Last 5 Encounters:  03/28/21 197 lb 12.8 oz (89.7 kg)  11/30/20 184 lb (83.5 kg)  11/15/20 177 lb (80.3 kg)  09/21/20 187 lb  6.4 oz (85 kg)  06/09/20 180 lb (81.6 kg)    Constitutional: No acute distress Eyes: sclera non-icteric, normal conjunctiva and lids ENMT: normal dentition, moist mucous membranes Cardiovascular: regular rhythm, normal rate, no murmurs. S1 and S2 normal. Radial pulses normal bilaterally. No jugular venous distention.  Respiratory: clear to auscultation bilaterally GI : normal bowel sounds, soft and nontender. No distention.   MSK: extremities warm, well perfused. No edema.  NEURO: grossly nonfocal exam, moves all extremities. PSYCH: alert and oriented x 3, normal mood and affect.    ASSESSMENT:    1. Unstable angina (HCC)   2. S/P angioplasty with stent   3. NSTEMI (non-ST  elevated myocardial infarction) (HCC)   4. Coronary artery disease involving native coronary artery of native heart without angina pectoris   5. Essential hypertension   6. Hypertriglyceridemia   7. Fatigue, unspecified type   8. SOB (shortness of breath)     PLAN:    Unstable angina (HCC) S/P angioplasty with stent - Plan: EKG 12-Lead Coronary artery disease involving native coronary artery of native  heart without angina pectoris -Continue dual antiplatelet therapy. I have instructed the patient that dual antiplatelet therapy should be taken for 1 year without interruption.  We have discussed the consequences of interrupted dual antiplatelet therapy and the risk for in-stent thrombosis.  -We will switch from Brilinta to Plavix due to shortness of breath.  He will let us know in a few weeks if this has been beneficial. -He has run out of aspirin at home, will provide samples of 81 mg aspirin and I have emphasized continued therapy indefinitely. -Continue beta-blocker, ARB, statin  Essential hypertension -blood pressure stable today, continue carvedilol and losartan.  Hypertriglyceridemia-discussed alcohol use in detail.  He is on statin and fibrate.  He feels his alcohol consumption is under good control  though he has not stopped entirely.  Continues on Crestor 40 mg daily.  We will recheck lipids today.  Fatigue Shortness of breath -We will check basic laboratory studies since he does not have a primary care physician, if abnormal will encourage him to make primary care follow-up as this is necessary for basic health maintenance screening as well.  We will switch from Brilinta to Plavix for the benefit of shortness of breath.  For fatigue would encourage him to continue moderate exercise program since he may be somewhat deconditioned per his report.  Total time of encounter: 30 minutes total time of encounter, including 25 minutes spent in face-to-face patient care on the date of this encounter. This time includes coordination of care and counseling regarding above mentioned problem list. Remainder of non-face-to-face time involved reviewing chart documents/testing relevant to the patient encounter and documentation in the medical record. I have independently reviewed documentation from referring provider.   Weston BrassGayatri Clayborne Divis, MD, [facility] Center[facility] [place]  [facility]    Medication Adjustments/Labs and Tests Ordered: Current medicines are reviewed at length with the patient today.  Concerns regarding medicines are outlined above.   No orders of the defined types were placed in this encounter.   No orders of the defined types were placed in this encounter.   Patient Instructions  Medication Instructions:  TAKE: LAST DOSE OF BRILINTA TONIGHT AND THEN STOP TAKING BRILINTA.   START: PLAVIX- TOMORROW (Thursday) 7/28- TAKE 300mg  PLAVIX (4) TABLETS. AND THEN Friday- START TAKING 75mg  PLAVIX (1) TABLET DAILY THEREAFTER *If you need a refill on your cardiac medications before your next appointment, please call your pharmacy*  Lab Work: CMP, CBC, TSH, LIPIDS- TODAY  If you have labs (blood work) drawn today and your tests are completely normal, you will receive your results only by: MyChart Message  (if you have MyChart) OR A paper copy in the mail If you have any lab test that is abnormal or we need to change your treatment, we will call you to review the results.  Follow-Up: At [facility], you and your health needs are our priority.  As part of our continuing mission to provide you with exceptional heart care, we have created designated Provider Care Teams.  These Care Teams include your primary Cardiologist (physician) and Advanced Practice Providers (APPs -  Physician Assistants and Nurse Practitioners) who all work together to provide you with the care you need, when you need it.  Your next appointment:   3 month(s)  The format for your next appointment:   In Person  Provider:   [name], MD  Other Instructions PLEASE ESTABLISH CARE WITH COMMUNITY HEALTH AND [facility]- [phone]

## 2021-03-28 ENCOUNTER — Other Ambulatory Visit: Payer: Self-pay

## 2021-03-28 ENCOUNTER — Encounter: Payer: Self-pay | Admitting: Internal Medicine

## 2021-03-28 ENCOUNTER — Ambulatory Visit (INDEPENDENT_AMBULATORY_CARE_PROVIDER_SITE_OTHER): Payer: BC Managed Care – PPO | Admitting: Internal Medicine

## 2021-03-28 VITALS — BP 128/78 | HR 67 | Ht 70.0 in | Wt 197.8 lb

## 2021-03-28 DIAGNOSIS — R5383 Other fatigue: Secondary | ICD-10-CM | POA: Diagnosis not present

## 2021-03-28 DIAGNOSIS — I2510 Atherosclerotic heart disease of native coronary artery without angina pectoris: Secondary | ICD-10-CM

## 2021-03-28 DIAGNOSIS — E781 Pure hyperglyceridemia: Secondary | ICD-10-CM

## 2021-03-28 DIAGNOSIS — I10 Essential (primary) hypertension: Secondary | ICD-10-CM | POA: Diagnosis not present

## 2021-03-28 DIAGNOSIS — I214 Non-ST elevation (NSTEMI) myocardial infarction: Secondary | ICD-10-CM

## 2021-03-28 DIAGNOSIS — Z95820 Peripheral vascular angioplasty status with implants and grafts: Secondary | ICD-10-CM | POA: Diagnosis not present

## 2021-03-28 DIAGNOSIS — R0602 Shortness of breath: Secondary | ICD-10-CM

## 2021-03-28 DIAGNOSIS — I200 Unstable angina: Secondary | ICD-10-CM

## 2021-03-28 LAB — COMPREHENSIVE METABOLIC PANEL
ALT: 22 IU/L (ref 0–44)
AST: 30 IU/L (ref 0–40)
Albumin/Globulin Ratio: 1.8 (ref 1.2–2.2)
Albumin: 4.7 g/dL (ref 3.8–4.9)
Alkaline Phosphatase: 46 IU/L (ref 44–121)
BUN/Creatinine Ratio: 8 — ABNORMAL LOW (ref 9–20)
BUN: 13 mg/dL (ref 6–24)
Bilirubin Total: 0.8 mg/dL (ref 0.0–1.2)
CO2: 21 mmol/L (ref 20–29)
Calcium: 9.7 mg/dL (ref 8.7–10.2)
Chloride: 100 mmol/L (ref 96–106)
Creatinine, Ser: 1.66 mg/dL — ABNORMAL HIGH (ref 0.76–1.27)
Globulin, Total: 2.6 g/dL (ref 1.5–4.5)
Glucose: 104 mg/dL — ABNORMAL HIGH (ref 65–99)
Potassium: 4.6 mmol/L (ref 3.5–5.2)
Sodium: 138 mmol/L (ref 134–144)
Total Protein: 7.3 g/dL (ref 6.0–8.5)
eGFR: 47 mL/min/{1.73_m2} — ABNORMAL LOW (ref >59)

## 2021-03-28 LAB — LIPID PANEL
Chol/HDL Ratio: 3.3 ratio (ref 0.0–5.0)
Cholesterol, Total: 219 mg/dL — ABNORMAL HIGH (ref 100–199)
HDL: 67 mg/dL (ref >39)
LDL Chol Calc (NIH): 110 mg/dL — ABNORMAL HIGH (ref 0–99)
Triglycerides: 244 mg/dL — ABNORMAL HIGH (ref 0–149)
VLDL Cholesterol Cal: 42 mg/dL — ABNORMAL HIGH (ref 5–40)

## 2021-03-28 LAB — CBC
Hematocrit: 45.6 % (ref 37.5–51.0)
Hemoglobin: 15.7 g/dL (ref 13.0–17.7)
MCH: 31.4 pg (ref 26.6–33.0)
MCHC: 34.4 g/dL (ref 31.5–35.7)
MCV: 91 fL (ref 79–97)
Platelets: 184 10*3/uL (ref 150–450)
RBC: 5 x10E6/uL (ref 4.14–5.80)
RDW: 13 % (ref 11.6–15.4)
WBC: 5.3 10*3/uL (ref 3.4–10.8)

## 2021-03-28 LAB — TSH: TSH: 3.29 u[IU]/mL (ref 0.450–4.500)

## 2021-03-28 MED ORDER — CLOPIDOGREL BISULFATE 75 MG PO TABS: 75.0000 mg | ORAL_TABLET | Freq: Every day | ORAL | 3 refills | Status: DC

## 2021-03-28 MED ORDER — CLOPIDOGREL BISULFATE 75 MG PO TABS: 300.0000 mg | ORAL_TABLET | Freq: Every day | ORAL | 0 refills | Status: DC

## 2021-03-28 MED ORDER — CLOPIDOGREL BISULFATE 75 MG PO TABS: ORAL_TABLET | ORAL | 0 refills | Status: DC

## 2021-03-28 NOTE — Patient Instructions (Addendum)
Medication Instructions:  TAKE: LAST DOSE OF BRILINTA TONIGHT AND THEN STOP TAKING BRILINTA.   START: PLAVIX- TOMORROW (Thursday) 7/28- TAKE 300mg  PLAVIX (4) TABLETS. AND THEN Friday (03/30/21)- START TAKING 75mg  PLAVIX (1) TABLET DAILY THEREAFTER *If you need a refill on your cardiac medications before your next appointment, please call your pharmacy*  Lab Work: CMP, CBC, TSH, LIPIDS- TODAY  If you have labs (blood work) drawn today and your tests are completely normal, you will receive your results only by: MyChart Message (if you have MyChart) OR A paper copy in the mail If you have any lab test that is abnormal or we need to change your treatment, we will call you to review the results.  Follow-Up: At [facility], you and your health needs are our priority.  As part of our continuing mission to provide you with exceptional heart care, we have created designated Provider Care Teams.  These Care Teams include your primary Cardiologist (physician) and Advanced Practice Providers (APPs -  Physician Assistants and Nurse Practitioners) who all work together to provide you with the care you need, when you need it.  Your next appointment:   3 month(s)  The format for your next appointment:   In Person  Provider:   , MD  Other Instructions PLEASE ESTABLISH CARE WITH COMM[facility]ITY [facility] AND [facility]- [phone]

## 2021-04-04 NOTE — Addendum Note (Signed)
Addended by: Bea Laura B on: 04/04/2021 08:54 AM   Modules accepted: Orders

## 2021-04-19 ENCOUNTER — Other Ambulatory Visit: Payer: Self-pay | Admitting: Internal Medicine

## 2021-04-26 ENCOUNTER — Telehealth: Payer: Self-pay | Admitting: Internal Medicine

## 2021-04-26 DIAGNOSIS — Z79899 Other long term (current) drug therapy: Secondary | ICD-10-CM

## 2021-04-26 NOTE — Telephone Encounter (Signed)
Patient calling to see if Dr. Jacques Navy would sign off on him having short term disability. He states he thinks he went back to work too early and is still having issues.

## 2021-04-26 NOTE — Telephone Encounter (Signed)
Returned call to patient. Patient states that he feels that he went back to work way to early after Left Heart Cath with stent placement to the RCA back in March of this year. Patient states that his issues have worsened and he feels that he needs to take a 4-5 week break, try to rest, and then return to work. Patient reports that he has been experiencing chest pain 2-4 times per week. Patient reports that the other day he had an episode of chest pain that he reports as a sharp stabbing pain in the center of his chest. Patient states that this episode lasted for 15 mins and reports that he did not have any Nitroglycerin on him. Patient states that other instances of chest pain feel like a burning sensation and are relieved with rest and states that those episodes of chest pain. Patient reports that his dizziness is better, but states that he gets very short of breath with changing positions and exertion. Patient states he has to walk a lot outdoors for his job and states that he get very hot and pours sweat. Patient reports he is taking his Plavix, ASA, and the rest of his medications as prescribed and denies any missed doses. Made patient aware of ED precautions should new or worsening symptoms develop. Advised patient to carry Nitroglycerin on him and advised him of instructions for dosing- made patient aware of the importance of this. Advised patient I would forward message to Dr. Jacques Navy for her to review and advise. Patient aware of all instructions and verbalized understanding.

## 2021-04-27 NOTE — Telephone Encounter (Signed)
Returned call to patient, made patient aware of the following recommendations from Dr. Jacques Navy.    Parke Poisson, MD  You 5 hours ago (10:40 AM)   I am sorry to hear he is feeling poorly. I do not complete disability paperwork. He will need to establish with a primary care doctor which I have emphasized over our last few visits to explore options for this based on any new non cardiac symptoms. Please have him call community health and wellness today.   With regard to cardiac symptoms, these are concerning sounding and I would recommend a follow up visit if he is still feeling poorly. With any red flag symptoms of CP or SOB he should present to the ED.   Please arrange follow up with me/DOD/APP next available.  [place]    Also advised patient of recent lab results as follows: Parke Poisson, MD  04/27/2021 10:37 AM EDT Back to Top    CBC and TSH normal in setting of fatigue.  Lipids still poorly controlled - will have to address further, recommend lipid clinic visit. Cr elevated, would hold ARB for 1-2 weeks and recheck Cr at that time.   Patient will hold his Losartan for 1-2 weeks and recheck BMET at that time. Made patient an appointment to see Dr. Jacques Navy on Wednesday 8/31. Patient states he has been unable to establish care with PCP. Offered to give patient information regarding making an appointment but patient states he was driving and couldn't write anything down at this time. Advised patient I would reach out to social work and will provide him with resources regarding PCP at [facility] on 8/31. Patient aware of ED precautions. Order for repeat BMET placed. Patient aware of all instructions and verbalized understanding.

## 2021-04-30 ENCOUNTER — Telehealth: Payer: Self-pay | Admitting: Licensed Clinical Social Worker

## 2021-04-30 NOTE — Telephone Encounter (Signed)
Pt was scheduled for new patient appt w/ PCP office.  Appt is 9/2 at 10:50am w/ Ricky Stabs, NP, at [facility]. Pt aware of this, I will provide him with additional PCP resources should he want to change physicians after his appt on Friday.   Octavio Graves, MSW, LCSW [facility] Heart/Vascular Care Navigation  [phone]

## 2021-04-30 NOTE — Telephone Encounter (Signed)
LCSW received referral to reach out to pt regarding PCP.  Called pt at [phone], no answer- voicemail left.   Octavio Graves, MSW, LCSW [facility] Heart/Vascular Care Navigation  [phone]

## 2021-04-30 NOTE — Progress Notes (Signed)
Heart and Vascular Care Navigation  04/30/2021  Barry Gutierrez 1961/08/05 [id number]  Reason for Referral:  PCP needs Engaged with patient by telephone for initial visit for Heart and Vascular Care Coordination.                                                                                                   Assessment:        LCSW received a call back from pt. I introduced self, role, reason for call. Pt confirmed home address and that he is aware of his upcoming appt with [facility]. He shares that he has not seen a PCP in recent past, he attempted to call [facility] and was told they are not seeing new patients. He is not sure he is going to be able to return soon to his job and was told that he needed to have a doctor complete Short Term Disability paperwork rather than cardiology office. LCSW inquired if pt still has listed [employer]- he shares that he does. He was advised that we can schedule him an appt at one of our other community clinics and that we can provide additional PCP resources during his appointment at [facility] office on Wednesday. He should call his [employer] plan and ensure any PCP appointments are in network. Pt in agreement with this plan, okay with me sending a text to his phone once appointment is scheduled.                               No additional questions/concerns at this time.   HRT/VAS Care Coordination     Patients Home Cardiology Office [facility]   Outpatient Care Team Social Worker   Social Worker Name: Octavio Graves, LCSW, [facility]   Living arrangements for the past 2 months Single Family Home   Patient Current Insurance Scientist, water quality   Patient Has Concern With Paying Medical Bills No   Does Patient Have Prescription Coverage? Yes   Home Assistive Devices/Equipment Eyeglasses       Social History:                                                                             SDOH Screenings   Alcohol Screen: Not on  file  Depression (PHQ2-9): Not on file  Financial Resource Strain: Not on file  Food Insecurity: Not on file  Housing: Not on file  Physical Activity: Not on file  Social Connections: Not on file  Stress: Not on file  Tobacco Use: High Risk   Smoking Tobacco Use: Every Day   Smokeless Tobacco Use: Never  Transportation Needs: Not on file    Follow-up plan:   LCSW reached out to [place], [facility] and she was able  to get pt scheduled for PCP appointment at [facility] with Ricky Stabs, NP, on Friday 9/2 at 10:50am. This was sent to pt phone at [phone]. Pt acknowledged information received. I will provide him with additional reminder during appt Wednesday. Remain available for any additional questions/concerns.

## 2021-05-02 ENCOUNTER — Other Ambulatory Visit: Payer: Self-pay

## 2021-05-02 ENCOUNTER — Ambulatory Visit (INDEPENDENT_AMBULATORY_CARE_PROVIDER_SITE_OTHER): Payer: BC Managed Care – PPO | Admitting: Internal Medicine

## 2021-05-02 ENCOUNTER — Encounter: Payer: Self-pay | Admitting: Internal Medicine

## 2021-05-02 VITALS — BP 140/80 | HR 64 | Ht 70.0 in | Wt 201.2 lb

## 2021-05-02 DIAGNOSIS — Z95820 Peripheral vascular angioplasty status with implants and grafts: Secondary | ICD-10-CM

## 2021-05-02 DIAGNOSIS — I214 Non-ST elevation (NSTEMI) myocardial infarction: Secondary | ICD-10-CM

## 2021-05-02 DIAGNOSIS — R079 Chest pain, unspecified: Secondary | ICD-10-CM | POA: Diagnosis not present

## 2021-05-02 DIAGNOSIS — I25119 Atherosclerotic heart disease of native coronary artery with unspecified angina pectoris: Secondary | ICD-10-CM | POA: Diagnosis not present

## 2021-05-02 DIAGNOSIS — N179 Acute kidney failure, unspecified: Secondary | ICD-10-CM

## 2021-05-02 DIAGNOSIS — I10 Essential (primary) hypertension: Secondary | ICD-10-CM

## 2021-05-02 DIAGNOSIS — E781 Pure hyperglyceridemia: Secondary | ICD-10-CM

## 2021-05-02 DIAGNOSIS — R5383 Other fatigue: Secondary | ICD-10-CM

## 2021-05-02 DIAGNOSIS — R0602 Shortness of breath: Secondary | ICD-10-CM

## 2021-05-02 DIAGNOSIS — Z79899 Other long term (current) drug therapy: Secondary | ICD-10-CM

## 2021-05-02 MED ORDER — ISOSORBIDE MONONITRATE ER 30 MG PO TB24: 30.0000 mg | ORAL_TABLET | Freq: Every day | ORAL | 3 refills | Status: DC

## 2021-05-02 NOTE — Patient Instructions (Addendum)
Medication Instructions:  START: ISOSORBIDE (IMDUR) 30mg  DAILY  *If you need a refill on your cardiac medications before your next appointment, please call your pharmacy*  Testing/Procedures: Your physician has requested that you have an echocardiogram. Echocardiography is a painless test that uses sound waves to create images of your heart. It provides your doctor with information about the size and shape of your heart and how well your heart's chambers and valves are working. You may receive an ultrasound enhancing agent through an IV if needed to better visualize your heart during the echo.This procedure takes approximately one hour. There are no restrictions for this procedure. This will take place at the [address].   Follow-Up: At [facility], you and your health needs are our priority.  As part of our continuing mission to provide you with exceptional heart care, we have created designated Provider Care Teams.  These Care Teams include your primary Cardiologist (physician) and Advanced Practice Providers (APPs -  Physician Assistants and Nurse Practitioners) who all work together to provide you with the care you need, when you need it.  Your next appointment:   3 WEEKS  The format for your next appointment:   In Person  Provider:   [facility], MD  Other Instructions PLEASE SCHEDULE APPOINTMENT WITH PHARM-D FOR LIPID (CHOLESTEROL) MANAGEMENT- REFERRAL HAS BEEN PLACED  PLEASE HAVE BMP DRAWN ON Friday 05/04/21 WITH YOUR MEDICAL DOCTOR

## 2021-05-02 NOTE — Progress Notes (Signed)
Cardiology Office Note:    Date:  05/02/2021   ID:  Barry Gutierrez, DOB Dec 29, 1960, MRN [id number]  PCP:  Patient, No Pcp Per (Inactive)  Cardiologist:  Parke PoissonGayatri A Jericho Alcorn, MD  Electrophysiologist:  None   Referring MD: No ref. provider found   Chief Complaint/Reason for Referral: CAD with PCI to the RCA  History of Present Illness:    Barry Gutierrez is a 60 y.o. male with a history of CAD and prior NSTEMI with occluded RCA, hypertension, hyperlipidemia, recently quit tobacco use, and alcohol abuse.  Presents for follow-up after unstable angina requiring coronary catheterization PCI to the RCA which had somewhat recanalized but was subtotally occluded.  He received 2 stents to the RCA in overlapping fashion, proximal and mid RCA on 11/15/20.  Has now established with a PCP and will see on 05/04/21.  Dizziness improved, unsure why but may be due to coming off of Brilinta.  Orthopnea with laying flat - sitting up makes it better almost immediately.   Switched from brilinta to plavix - this did help some with SOB.   Called into triage with concerns of ongoing chest pain. Sharp stabbing pain in center of his chest. 15 minutes, and did not have nitro on him so could not take to relieve pain. Burning sensation, worse with exertion and improves with rest. Still gets very short of breath when changing positions and with exertion. Gets "very hot and pours sweat".  Sharp chest pain - always there, but sometimes really bad. Sharp very bad pain right in the center. Sits to make pain better. Took a nitro one time and it helped. Did that 1.5 months ago and hasn't taking nitro since.   The patient denies palpitations, PND, orthopnea, or leg swelling. Denies cough, fever, chills. Denies nausea, vomiting. Denies syncope or presyncope. Denies dizziness or lightheadedness.   Past Medical History:  Diagnosis Date   Allergy    MI (myocardial infarction) (HCC)    S/P angioplasty with stent DES to RCA  11/15/20 11/15/2020    Past Surgical History:  Procedure Laterality Date   CORONARY STENT INTERVENTION N/A 11/15/2020   Procedure: CORONARY STENT INTERVENTION;  Surgeon: Corky CraftsVaranasi, Jayadeep S, MD;  [facility];  Service: Cardiovascular;  Laterality: N/A;   LEFT HEART CATH AND CORONARY ANGIOGRAPHY N/A 08/04/2019   Procedure: LEFT HEART CATH AND CORONARY ANGIOGRAPHY;  Surgeon: Iran OuchArida, Muhammad A, MD;  Location: [facility] INVAS[facility] CV LAB;  Service: Cardiovascular;  Laterality: N/A;   LEFT HEART CATH AND CORONARY ANGIOGRAPHY N/A 11/15/2020   Procedure: LEFT HEART CATH AND CORONARY ANGIOGRAPHY;  Surgeon: Corky CraftsVaranasi, Jayadeep S, MD;  [facility];  Service: Cardiovascular;  Laterality: N/A;    Current Medications: Current Meds  Medication Sig   acetaminophen (TYLENOL) 325 MG tablet Take 325-650 mg by mouth every 6 (six) hours as needed for mild pain (for pain).   aspirin EC 81 MG EC tablet Take 1 tablet (81 mg total) by mouth daily.   carvedilol (COREG) 12.5 MG tablet Take 1 tablet (12.5 mg total) by mouth 2 (two) times daily with a meal.   clopidogrel (PLAVIX) 75 MG tablet Take 1 tablet (75 mg total) by mouth daily. Please start 75 mg Daily on 03/30/21.   clopidogrel (PLAVIX) 75 MG tablet Please take 300mg  (4) Tablets as loading dose on Morning of 03/29/21. Then Continue 75mg  Daily Thereafter Starting 03/30/21.   fenofibrate 160 MG tablet Take 1 tablet (160 mg total) by mouth daily.  isosorbide mononitrate (IMDUR) 30 MG 24 hr tablet Take 1 tablet (30 mg total) by mouth daily.   losartan (COZAAR) 50 MG tablet TAKE 1 TABLET (50 MG TOTAL) BY MOUTH DAILY.   nitroGLYCERIN (NITROSTAT) 0.4 MG SL tablet Place 1 tablet (0.4 mg total) under the tongue every 5 (five) minutes x 3 doses as needed for chest pain.   rosuvastatin (CRESTOR) 40 MG tablet Take 1 tablet (40 mg total) by mouth daily.     Allergies:   Patient has no known allergies.   Social History   Tobacco Use   Smoking  status: Every Day    Packs/day: 2.00    Types: Cigarettes   Smokeless tobacco: Never  Substance Use Topics   Alcohol use: Yes   Drug use: Yes    Types: Marijuana    Comment: every other month or so      Family History: The patient's family history includes CAD in his father and mother; Cancer in his mother; Coronary artery disease in his father and mother; Diabetes in his father.  ROS:   Please see the history of present illness.    (+) All other systems reviewed and are negative.  EKGs/Labs/Other Studies Reviewed:    The following studies were reviewed today:  EKG: 05/02/21: NSR, rate 64 03/28/2021: Normal sinus rhythm rate 67, probable LVH criteria 11/30/2020: Sinus rhythm, LVH criteria I have independently reviewed the images from left heart cath 11/15/2020.  Recent Labs: 03/28/2021: ALT 22; BUN 13; Creatinine, Ser 1.66; Hemoglobin 15.7; Platelets 184; Potassium 4.6; Sodium 138; TSH 3.290  Recent Lipid Panel    Component Value Date/Time   CHOL 219 (H) 03/28/2021 0912   TRIG 244 (H) 03/28/2021 0912   HDL 67 03/28/2021 0912   CHOLHDL 3.3 03/28/2021 0912   CHOLHDL 2.8 11/15/2020 0014   VLDL 64 (H) 11/15/2020 0014   LDLCALC 110 (H) 03/28/2021 0912    Physical Exam:    VS:  BP 140/80 (BP Location: Right Arm)   Pulse 64   Ht 5\' 10"  (1.778 m)   Wt 201 lb 3.2 oz (91.3 kg)   SpO2 96%   BMI 28.87 kg/m     Wt Readings from Last 5 Encounters:  05/02/21 201 lb 3.2 oz (91.3 kg)  03/28/21 197 lb 12.8 oz (89.7 kg)  11/30/20 184 lb (83.5 kg)  11/15/20 177 lb (80.3 kg)  09/21/20 187 lb 6.4 oz (85 kg)    Constitutional: No acute distress Eyes: sclera non-icteric, normal conjunctiva and lids ENMT: normal dentition, moist mucous membranes Cardiovascular: regular rhythm, normal rate, no murmurs. S1 and S2 normal. Radial pulses normal bilaterally. No jugular venous distention.  Respiratory: clear to auscultation bilaterally GI : normal bowel sounds, soft and nontender. No  distention.   MSK: extremities warm, well perfused. No edema.  NEURO: grossly nonfocal exam, moves all extremities. PSYCH: alert and oriented x 3, normal mood and affect.   ASSESSMENT:    1. Chest pain, unspecified type   2. Coronary artery disease involving native coronary artery of native heart with angina pectoris (HCC)   3. NSTEMI (non-ST elevated myocardial infarction) (HCC)   4. S/P angioplasty with stent DES to RCA 11/15/20   5. AKI (acute kidney injury) (HCC)   6. Medication management   7. Essential hypertension   8. Hypertriglyceridemia   9. Fatigue, unspecified type   10. SOB (shortness of breath)      PLAN:    Chest pain SOB Fatigue AKI - will order an  echo today, need to evaluate for pericardial effusion and pericarditis in the setting of sharp chest pain, however chest pain is nitro responsive and I am more concerned for angina, will need evaluation of regional wall motion abnormality, may need Definity contrast if images not clear. - will start imdur 30 mg daily -Continue dual antiplatelet therapy. I have instructed the patient that dual antiplatelet therapy should be taken for 1 year without interruption.  We have discussed the consequences of interrupted dual antiplatelet therapy and the risk for in-stent thrombosis.  -We switched from Brilinta to Plavix due to shortness of breath.  This has helped some. -Continue beta-blocker, statin - held ARB since 8/26, will recheck labs on 05/04/21. Cr up from normal baseline to 1.66 at last check.  He is establishing with a new primary care NP on Friday, I would request labs to be performed at that visit to evaluate for renal function.  Essential hypertension -blood pressure mildly elevated today, continue carvedilol. Losartan on hold for AKI.  Needs repeat lab studies prior to resuming losartan, however I suspect we can resume that soon if renal function returns to normal.  Hypertriglyceridemia- LDL and triglycerides both above  goal, as is VLDL, needs further lipid control. Will refer to [facility] lipid clinic.  He is in agreement, would be a great candidate for PCSK9 inhibitor.  Total time of encounter: 30 minutes total time of encounter, including 25 minutes spent in face-to-face patient care on the date of this encounter. This time includes coordination of care and counseling regarding above mentioned problem list. Remainder of non-face-to-face time involved reviewing chart documents/testing relevant to the patient encounter and documentation in the medical record. I have independently reviewed documentation from referring provider.   [name], MD, [facility]  [facility]    Medication Adjustments/Labs and Tests Ordered: Current medicines are reviewed at length with the patient today.  Concerns regarding medicines are outlined above.   Orders Placed This Encounter  Procedures   AMB Referral to [facility] Pharm-D   EKG 12-Lead   ECHOCARDIOGRAM COMPLETE     Meds ordered this encounter  Medications   isosorbide mononitrate (IMDUR) 30 MG 24 hr tablet    Sig: Take 1 tablet (30 mg total) by mouth daily.    Dispense:  90 tablet    Refill:  3     Patient Instructions  Medication Instructions:  START: ISOSORBIDE (IMDUR) 30mg  DAILY  *If you need a refill on your cardiac medications before your next appointment, please call your pharmacy*  Testing/Procedures: Your physician has requested that you have an echocardiogram. Echocardiography is a painless test that uses sound waves to create images of your heart. It provides your doctor with information about the size and shape of your heart and how well your heart's chambers and valves are working. You may receive an ultrasound enhancing agent through an IV if needed to better visualize your heart during the echo.This procedure takes approximately one hour. There are no restrictions for this procedure. This will take place at the [address].   Follow-Up: At [facility], you and your health needs are our priority.  As part of our continuing mission to provide you with exceptional heart care, we have created designated Provider Care Teams.  These Care Teams include your primary Cardiologist (physician) and Advanced Practice Providers (APPs -  Physician Assistants and Nurse Practitioners) who all work together to provide you with the care you need, when you need  it.  Your next appointment:   3 WEEKS  The format for your next appointment:   In Person  Provider:   [name], MD  Other Instructions PLEASE SCHEDULE APPOINTMENT WITH PHARM-D FOR LIPID (CHOLESTEROL) MANAGEMENT- REFERRAL HAS BEEN PLACED  PLEASE HAVE BMP DRAWN ON Friday 05/04/21 WITH YOUR MEDICAL DOCTOR

## 2021-05-02 NOTE — Progress Notes (Signed)
Subjective:    Barry Gutierrez - 60 y.o. male MRN [id number]  Date of birth: 03/13/1961  HPI  Barry Gutierrez is to establish care.   Current issues and/or concerns: Followed by Cardiology for CAD, NSTEMI, s/p angioplasty with stent, AKI, essential hypertension, hypertriglyceridemia, fatigue, and shortness of breath. Establishing today as has not seen primary provider previously. Requesting completion of FMLA paperwork. 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 Has used all sick days at work. Plans to get FMLA paperwork from his job and return to office for provider completion. Reports his cardiologist would like use to redraw blood work for kidneys.   ROS per HPI    Health Maintenance:  Health Maintenance Due  Topic Date Due   COVID-19 Vaccine (1) Never done   Pneumococcal Vaccine 23-58 Years old (1 - PCV) Never done   Zoster Vaccines- Shingrix (1 of 2) Never done   INFLUENZA VACCINE  Never done     Past Medical History: Patient Active Problem List   Diagnosis Date Noted   S/P angioplasty with stent DES to RCA 11/15/20 11/15/2020   Alcohol withdrawal delirium (HCC) 08/04/2019   Dyslipidemia, goal LDL below 70 08/04/2019   CAD (coronary artery disease) 08/04/2019   Unstable angina (HCC) 08/03/2019   Essential hypertension    Alcohol abuse    NSTEMI (non-ST elevated myocardial infarction) (HCC) 07/31/2019   Diverticulosis of colon 02/05/2018   Upper airway cough syndrome 11/13/2017   Cigarette smoker 11/13/2017   Asthmatic bronchitis with acute exacerbation 11/12/2017      Social History   reports that he has been smoking cigarettes. He has been smoking an average of 2 packs per day. He has never used smokeless tobacco. He reports current alcohol use. He reports current drug use. Drug: Marijuana.   Family History  family history includes CAD in his father and mother; Cancer in his mother; Coronary artery disease in his father  and mother; Diabetes in his father.   Medications: reviewed and updated   Objective:   Physical Exam BP 114/72 (BP Location: Left Arm, Patient Position: Sitting, Cuff Size: Normal)   Pulse 75   Temp 98.4 F (36.9 C)   Resp 16   Ht 5\' 10"  (1.778 m)   Wt 198 lb 3.2 oz (89.9 kg)   SpO2 95%   BMI 28.44 kg/m   Physical Exam HENT:     Head: Normocephalic and atraumatic.  Eyes:     Extraocular Movements: Extraocular movements intact.     Conjunctiva/sclera: Conjunctivae normal.     Pupils: Pupils are equal, round, and reactive to light.  Cardiovascular:     Rate and Rhythm: Normal rate and regular rhythm.     Pulses: Normal pulses.     Heart sounds: Normal heart sounds.  Pulmonary:     Effort: Pulmonary effort is normal.     Breath sounds: Normal breath sounds.  Musculoskeletal:     Cervical back: Normal range of motion and neck supple. No tenderness.  Neurological:     General: No focal deficit present.     Mental Status: He is alert and oriented to person, place, and time.  Psychiatric:        Mood and Affect: Mood normal.        Behavior: Behavior normal.     Assessment & Plan:  1. Encounter to establish care: - Patient presents today to establish care.  - Return for annual  physical examination, labs, and health maintenance. Arrive fasting meaning having no food for at least 8 hours prior to appointment. You may have only water or black coffee. Please take scheduled medications as normal.  2. AKI (acute kidney injury) [facility]): - Per request from [name], MD at Cardiology will obtain repeat BMP to evaluate kidney function - Basic Metabolic Panel  3. Encounter for completion of form with patient: - Patient reports plans to obtain FMLA paperwork for employer and return to office for provider completion. Counseled patient an appointment will be needed for completion, patient agreeable.     Patient was given clear instructions to go to Emergency Department or return  to medical center if symptoms don't improve, worsen, or new problems develop.The patient verbalized understanding.  I discussed the assessment and treatment plan with the patient. The patient was provided an opportunity to ask questions and all were answered. The patient agreed with the plan and demonstrated an understanding of the instructions.   The patient was advised to call back or seek an in-person evaluation if the symptoms worsen or if the condition fails to improve as anticipated.    Ricky Stabs, NP 05/04/2021, 1:34 PM Primary Care at [facility]

## 2021-05-03 ENCOUNTER — Other Ambulatory Visit: Payer: Self-pay

## 2021-05-04 ENCOUNTER — Encounter: Payer: Self-pay | Admitting: Family

## 2021-05-04 ENCOUNTER — Ambulatory Visit (INDEPENDENT_AMBULATORY_CARE_PROVIDER_SITE_OTHER): Payer: BC Managed Care – PPO | Admitting: Family

## 2021-05-04 ENCOUNTER — Other Ambulatory Visit: Payer: Self-pay

## 2021-05-04 VITALS — BP 114/72 | HR 75 | Temp 98.4°F | Resp 16 | Ht 70.0 in | Wt 198.2 lb

## 2021-05-04 DIAGNOSIS — Z0289 Encounter for other administrative examinations: Secondary | ICD-10-CM

## 2021-05-04 DIAGNOSIS — N179 Acute kidney failure, unspecified: Secondary | ICD-10-CM | POA: Diagnosis not present

## 2021-05-04 DIAGNOSIS — Z7689 Persons encountering health services in other specified circumstances: Secondary | ICD-10-CM | POA: Diagnosis not present

## 2021-05-04 NOTE — Patient Instructions (Signed)
Thank you for choosing Primary Care at [facility] for your medical home!    Barry Gutierrez was seen by Rema Fendt, NP today.   Barry Scales Padgett's primary care provider is Rema Fendt, NP.   For the best care possible,  you should try to see Ricky Stabs, NP whenever you come to clinic.   We look forward to seeing you again soon!  If you have any questions about your visit today,  please call us at [phone]  Or feel free to reach your provider via MyChart.   Keeping you healthy   Get these tests Blood pressure- Have your blood pressure checked once a year by your healthcare provider.  Normal blood pressure is 120/80. Weight- Have your body mass index (BMI) calculated to screen for obesity.  BMI is a measure of body fat based on height and weight. You can also calculate your own BMI at https://www.west-esparza.com/. Cholesterol- Have your cholesterol checked regularly starting at age 78, sooner may be necessary if you have diabetes, high blood pressure, if a family member developed heart diseases at an early age or if you smoke.  Chlamydia, HIV, and other sexual transmitted disease- Get screened each year until the age of 2 then within three months of each new sexual partner. Diabetes- Have your blood sugar checked regularly if you have high blood pressure, high cholesterol, a family history of diabetes or if you are overweight.   Get these vaccines Flu shot- Every fall. Tetanus shot- Every 10 years. Menactra- Single dose; prevents meningitis.   Take these steps Don't smoke- If you do smoke, ask your healthcare provider about quitting. For tips on how to quit, go to www.smokefree.gov or call 1-800-QUIT-NOW. Be physically active- Exercise 5 days a week for at least 30 minutes.  If you are not already physically active start slow and gradually work up to 30 minutes of moderate physical activity.  Examples of moderate activity include walking briskly, mowing the yard, dancing,  swimming bicycling, etc. Eat a healthy diet- Eat a variety of healthy foods such as fruits, vegetables, low fat milk, low fat cheese, yogurt, lean meats, poultry, fish, beans, tofu, etc.  For more information on healthy eating, go to www.thenutritionsource.org Drink alcohol in moderation- Limit alcohol intake two drinks or less a day.  Never drink and drive. Dentist- Brush and floss teeth twice daily; visit your dentis twice a year. Depression-Your emotional health is as important as your physical health.  If you're feeling down, losing interest in things you normally enjoy please talk with your healthcare provider. Gun Safety- If you keep a gun in your home, keep it unloaded and with the safety lock on.  Bullets should be stored separately. Helmet use- Always wear a helmet when riding a motorcycle, bicycle, rollerblading or skateboarding. Safe sex- If you may be exposed to a sexually transmitted infection, use a condom Seat belts- Seat bels can save your life; always wear one. Smoke/Carbon Monoxide detectors- These detectors need to be installed on the appropriate level of your home.  Replace batteries at least once a year. Skin Cancer- When out in the sun, cover up and use sunscreen SPF 15 or higher. Violence- If anyone is threatening or hurting you, please tell your healthcare provider.

## 2021-05-04 NOTE — Progress Notes (Signed)
Pt present to establish care

## 2021-05-05 LAB — BASIC METABOLIC PANEL
BUN/Creatinine Ratio: 8 — ABNORMAL LOW (ref 9–20)
BUN: 9 mg/dL (ref 6–24)
CO2: 19 mmol/L — ABNORMAL LOW (ref 20–29)
Calcium: 8.9 mg/dL (ref 8.7–10.2)
Chloride: 105 mmol/L (ref 96–106)
Creatinine, Ser: 1.09 mg/dL (ref 0.76–1.27)
Glucose: 100 mg/dL — ABNORMAL HIGH (ref 65–99)
Potassium: 4.4 mmol/L (ref 3.5–5.2)
Sodium: 143 mmol/L (ref 134–144)
eGFR: 79 mL/min/{1.73_m2} (ref >59)

## 2021-05-05 NOTE — Progress Notes (Signed)
Kidney function improved and back to normal since 4 weeks ago. Will forward results to [name], MD at Cardiology as this lab was collected per her request at visit their visit on 05/02/2021.

## 2021-05-21 ENCOUNTER — Ambulatory Visit (INDEPENDENT_AMBULATORY_CARE_PROVIDER_SITE_OTHER): Payer: BC Managed Care – PPO | Admitting: Pharmacist

## 2021-05-21 ENCOUNTER — Ambulatory Visit (HOSPITAL_COMMUNITY): Payer: BC Managed Care – PPO | Attending: Cardiology

## 2021-05-21 ENCOUNTER — Other Ambulatory Visit: Payer: Self-pay

## 2021-05-21 ENCOUNTER — Telehealth: Payer: Self-pay | Admitting: Pharmacist

## 2021-05-21 DIAGNOSIS — E782 Mixed hyperlipidemia: Secondary | ICD-10-CM

## 2021-05-21 DIAGNOSIS — R079 Chest pain, unspecified: Secondary | ICD-10-CM | POA: Insufficient documentation

## 2021-05-21 LAB — ECHOCARDIOGRAM COMPLETE
Area-P 1/2: 3.77 cm2
S' Lateral: 3.8 cm

## 2021-05-21 MED ORDER — ROSUVASTATIN CALCIUM 40 MG PO TABS: 40.0000 mg | ORAL_TABLET | Freq: Every day | ORAL | 3 refills | Status: DC

## 2021-05-21 MED ORDER — FENOFIBRATE 160 MG PO TABS: 160.0000 mg | ORAL_TABLET | Freq: Every day | ORAL | 3 refills | Status: DC

## 2021-05-21 MED ORDER — ICOSAPENT ETHYL 1 G PO CAPS: 2.0000 g | ORAL_CAPSULE | Freq: Two times a day (BID) | ORAL | 3 refills | Status: DC

## 2021-05-21 NOTE — Patient Instructions (Signed)
Your LDL cholesterol is 110 and your goal is < 55 -Restart rosuvastatin (Crestor) 40mg  - 1 tablet once daily -I'll call your pharmacy to confirm you had been taking this when your labs were checked -I'll submit information to your insurance to see if they will cover Repatha or Praluent. They are a subcutaneous injection given once every 2 weeks in the fatty tissue of your stomach or upper outer thigh. Store the medication in the fridge. You can let your dose warm up to room temperature for 30 minutes before injecting if you prefer. They lower your LDL cholesterol by 60% and helps to lower your chance of having a heart attack or stroke.  Your triglycerides are 244 and your goal is < 150 -Continue taking fenofibrate 160mg  - 1 tablet once daily -Start Vascepa (prescription fish oil) 2 capsules twice daily with food -Decrease alcohol, goal 2 drinks per day max

## 2021-05-21 NOTE — Progress Notes (Signed)
Patient ID: [name]                 DOB: [birth year]                    MRN: [id number]     HPI: Barry Gutierrez is a 60 y.o. male patient referred to lipid clinic by Dr Barry Gutierrez. PMH is significant for CAD s/p NSTEMI with occluded RCA, HTN, HLD, recently quit tobacco use, and alcohol abuse. Unstable angina previously required cath and PCI to RCA which became occluded, now s/p 2 stents to prox and mid RCA in 10/2020. Reported ongoing chest pain at last visit with Dr Barry Gutierrez on 05/02/21. He was started on Imdur.  Pt was previously prescribed atorvastatin 80mg  daily from 08/2019 to 09/2020. Unclear why this was stopped, pt does not recall. He was instead prescribed rosuvastatin 40mg  daily in 09/2020, however this was also removed from his med list at PCP visit a few weeks ago. Pt does not know why either. Doesn't recall any specific side effects with either statin. Will call his pharmacy to confirm adherence to statin therapy earlier this year, most recent LDL was 110. He quit smoking earlier this year, currently vaping. Drinks 3-4 liquor-based drinks per day. Both parents have hx of ASCVD, father dx in his 35s, mother dx in her 56s.  Current Medications: fenofibrate 160mg  daily Risk Factors: premature ASCVD, FHx of CAD LDL goal: 55mg /dL  Diet: 3-4 liquor-based drinks per day. Likes fish, chicken  Exercise: not much, was door to 70s and would walk then  Family History: CAD in his father and mother; Cancer in his mother; Diabetes in his father.  Social History: Former tobacco use 2 PPD. Alcohol and drug use (marijuana)  Labs: 03/28/21: TC 219, TG 244, HDL 67, LDL 110  Past Medical History:  Diagnosis Date   Allergy    MI (myocardial infarction) (HCC)    S/P angioplasty with stent DES to RCA 11/15/20 11/15/2020    Current Outpatient Medications on File Prior to Visit  Medication Sig Dispense Refill   acetaminophen (TYLENOL) 325 MG tablet Take 325-650 mg by mouth every 6 (six)  hours as needed for mild pain (for pain).     aspirin EC 81 MG EC tablet Take 1 tablet (81 mg total) by mouth daily. 90 tablet 3   carvedilol (COREG) 12.5 MG tablet Take 1 tablet (12.5 mg total) by mouth 2 (two) times daily with a meal. 180 tablet 3   clopidogrel (PLAVIX) 75 MG tablet Take 1 tablet (75 mg total) by mouth daily. Please start 75 mg Daily on 03/30/21. 90 tablet 3   fenofibrate 160 MG tablet Take 1 tablet (160 mg total) by mouth daily. 30 tablet 6   isosorbide mononitrate (IMDUR) 30 MG 24 hr tablet Take 1 tablet (30 mg total) by mouth daily. 90 tablet 3   nitroGLYCERIN (NITROSTAT) 0.4 MG SL tablet Place 1 tablet (0.4 mg total) under the tongue every 5 (five) minutes x 3 doses as needed for chest pain. 25 tablet 1   No current facility-administered medications on file prior to visit.    No Known Allergies  Assessment/Plan:  1. Hyperlipidemia - LDL 110 above goal < 55 due to premature ASCVD, TG 244 above goal < 150. Pt previously prescribed atorvastatin 80mg  daily, changed to rosuvastatin 40mg  daily in January of this year and med then d/c'ed from his list at PCP visit a few weeks ago. I called pt's pharmacy  as he was unclear about which (if any) statin he has been taking, also didn't report any specific side effects. Pharmacy states he hasn't filled rosuvastatin since January 2022, so his labs reflect him on only fenofibrate 160mg  daily. I will restart rosuvastatin 40mg  daily. Will also add Vascepa 2g BID for CV benefit and elevated TG. Pt also encouraged to decrease alcohol to 2 drinks per day max (currently having 3-4 per day) as this is also contributing to elevated TG. Will keep a close eye on lipids and recheck in 4 weeks. If LDL remains above goal, PCSK9i was discussed today and pt is agreeable to start this (Praluent preferred on formulary); ezetimibe or Nexlizet may be options as well.    Barry Gutierrez, PharmD, BCACP, CPP [facility] [address]  [place], [address] [place] Phone: [phone]; Fax: [phone] 05/21/2021 9:22 AM

## 2021-05-21 NOTE — Telephone Encounter (Addendum)
Pt aware of plan noted below. Prior auth for Praluent has been approved through 05/21/22, will pursue if additional lipid lowering therapy is needed at follow up labs.

## 2021-05-21 NOTE — Telephone Encounter (Signed)
Patient returned call. Advised that he will need to start rosuvastatin 40mg  daily. Rx sent to pharmacy. is covered by his insurance. Rx and copay card for $9/90 days sent in. Patient scheduled for labs 06/26/21.

## 2021-05-21 NOTE — Telephone Encounter (Signed)
Left message for pt.  Will advise him that per pharmacy records, he has not picked up a statin since January 2022. He needs to start the rosuvastatin 40mg  daily that I sent in today. Vascepa has also been covered by his insurance, I sent in rx along with $9/3 month copay card. Will need to recheck fasting lipids in 4-6 weeks and can add on additional therapy at that time if warranted.

## 2021-05-24 ENCOUNTER — Telehealth: Payer: Self-pay | Admitting: Family

## 2021-05-24 NOTE — Progress Notes (Signed)
Virtual Visit via Telephone Note  I connected with Barry Gutierrez, on 05/25/2021 at 1:57 PM by telephone due to the COVID-19 pandemic and verified that I am speaking with the correct person using two identifiers.  Due to current restrictions/limitations of in-office visits due to the COVID-19 pandemic, this scheduled clinical appointment was converted to a telehealth visit.   Consent: I discussed the limitations, risks, security and privacy concerns of performing an evaluation and management service by telephone and the availability of in person appointments. I also discussed with the patient that there may be a patient responsible charge related to this service. The patient expressed understanding and agreed to proceed.   Location of Patient: Home  Location of Provider: [facility]   Persons participating in Telemedicine visit: Dhaval Woo Markovic Idelia Caudell Zonia Kief, NP Margorie John, CMA   History of Present Illness: Barry Gutierrez. [name] is a 60 year-old male who presents for Disability paperwork completion. 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 Reports had appointment scheduled with Cardiology on today and was unable to make it. Has rescheduled Cardiology appointment for June 18, 2021.     Past Medical History:  Diagnosis Date   Allergy    MI (myocardial infarction) (HCC)    S/P angioplasty with stent DES to RCA 11/15/20 11/15/2020   No Known Allergies  Current Outpatient Medications on File Prior to Visit  Medication Sig Dispense Refill   acetaminophen (TYLENOL) 325 MG tablet Take 325-650 mg by mouth every 6 (six) hours as needed for mild pain (for pain).     aspirin EC 81 MG EC tablet Take 1 tablet (81 mg total) by mouth daily. 90 tablet 3   carvedilol (COREG) 12.5 MG tablet Take 1 tablet (12.5 mg total) by mouth 2 (two) times daily with a meal. 180 tablet 3   clopidogrel  (PLAVIX) 75 MG tablet Take 1 tablet (75 mg total) by mouth daily. Please start 75 mg Daily on 03/30/21. 90 tablet 3   fenofibrate 160 MG tablet Take 1 tablet (160 mg total) by mouth daily. 90 tablet 3   icosapent Ethyl (VASCEPA) 1 g capsule Take 2 capsules (2 g total) by mouth 2 (two) times daily. 360 capsule 3   isosorbide mononitrate (IMDUR) 30 MG 24 hr tablet Take 1 tablet (30 mg total) by mouth daily. 90 tablet 3   nitroGLYCERIN (NITROSTAT) 0.4 MG SL tablet Place 1 tablet (0.4 mg total) under the tongue every 5 (five) minutes x 3 doses as needed for chest pain. 25 tablet 1   rosuvastatin (CRESTOR) 40 MG tablet Take 1 tablet (40 mg total) by mouth daily. 90 tablet 3   No current facility-administered medications on file prior to visit.    Observations/Objective: Alert and oriented x 3. Not in acute distress. Physical examination not completed as this is a telemedicine visit.  Assessment and Plan: 1. Encounter for completion of form with patient: - Completed Disability paperwork today with patient.  - Counseled patient Cardiology will need to medically release him back to work if appropriate. Patient agreeable and plans to discuss with Cardiology at his next appointment on June 18, 2021.    Follow Up Instructions: Follow-up with primary provider as scheduled.    Patient was given clear instructions to go to Emergency Department or return to medical center if symptoms don't improve, worsen, or new problems develop.The patient verbalized understanding.  I discussed the assessment and treatment plan with the patient. The patient was provided an opportunity to ask questions and all were answered. The patient agreed with the plan and demonstrated an understanding of the instructions.   The patient was advised to call back or seek an in-person evaluation if the symptoms worsen or if the condition fails to improve as anticipated.     I provided 23 minutes total of non-face-to-face time  during this encounter.   Rema Fendt, NP  [facility] [place], [place] [phone] 05/25/2021, 1:57 PM

## 2021-05-24 NOTE — Telephone Encounter (Signed)
Appt needs to be scheduled due to new FMLA policy

## 2021-05-24 NOTE — Telephone Encounter (Signed)
Pt came into office upset about FMLA paperwork not being complete as of yet states have until 05/28/21  or will loose job please assist  new pt

## 2021-05-25 ENCOUNTER — Other Ambulatory Visit: Payer: Self-pay

## 2021-05-25 ENCOUNTER — Ambulatory Visit (INDEPENDENT_AMBULATORY_CARE_PROVIDER_SITE_OTHER): Payer: BC Managed Care – PPO | Admitting: Family

## 2021-05-25 ENCOUNTER — Ambulatory Visit: Payer: BC Managed Care – PPO | Admitting: Internal Medicine

## 2021-05-25 DIAGNOSIS — Z0289 Encounter for other administrative examinations: Secondary | ICD-10-CM | POA: Diagnosis not present

## 2021-05-25 NOTE — Progress Notes (Signed)
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

## 2021-06-18 ENCOUNTER — Other Ambulatory Visit: Payer: Self-pay

## 2021-06-18 ENCOUNTER — Encounter: Payer: Self-pay | Admitting: Internal Medicine

## 2021-06-18 ENCOUNTER — Ambulatory Visit (INDEPENDENT_AMBULATORY_CARE_PROVIDER_SITE_OTHER): Payer: BC Managed Care – PPO | Admitting: Internal Medicine

## 2021-06-18 VITALS — BP 123/73 | HR 72 | Ht 70.0 in | Wt 201.2 lb

## 2021-06-18 DIAGNOSIS — E782 Mixed hyperlipidemia: Secondary | ICD-10-CM

## 2021-06-18 DIAGNOSIS — R5383 Other fatigue: Secondary | ICD-10-CM

## 2021-06-18 DIAGNOSIS — I214 Non-ST elevation (NSTEMI) myocardial infarction: Secondary | ICD-10-CM

## 2021-06-18 DIAGNOSIS — R079 Chest pain, unspecified: Secondary | ICD-10-CM | POA: Diagnosis not present

## 2021-06-18 DIAGNOSIS — E781 Pure hyperglyceridemia: Secondary | ICD-10-CM

## 2021-06-18 DIAGNOSIS — N179 Acute kidney failure, unspecified: Secondary | ICD-10-CM

## 2021-06-18 DIAGNOSIS — Z79899 Other long term (current) drug therapy: Secondary | ICD-10-CM

## 2021-06-18 DIAGNOSIS — I10 Essential (primary) hypertension: Secondary | ICD-10-CM

## 2021-06-18 DIAGNOSIS — I25119 Atherosclerotic heart disease of native coronary artery with unspecified angina pectoris: Secondary | ICD-10-CM | POA: Diagnosis not present

## 2021-06-18 DIAGNOSIS — Z95820 Peripheral vascular angioplasty status with implants and grafts: Secondary | ICD-10-CM

## 2021-06-18 DIAGNOSIS — R0602 Shortness of breath: Secondary | ICD-10-CM

## 2021-06-18 LAB — LIPID PANEL
Chol/HDL Ratio: 2.1 ratio (ref 0.0–5.0)
Cholesterol, Total: 205 mg/dL — ABNORMAL HIGH (ref 100–199)
HDL: 98 mg/dL (ref >39)
LDL Chol Calc (NIH): 90 mg/dL (ref 0–99)
Triglycerides: 100 mg/dL (ref 0–149)
VLDL Cholesterol Cal: 17 mg/dL (ref 5–40)

## 2021-06-18 LAB — HEPATIC FUNCTION PANEL
ALT: 55 IU/L — ABNORMAL HIGH (ref 0–44)
AST: 81 IU/L — ABNORMAL HIGH (ref 0–40)
Albumin: 5 g/dL — ABNORMAL HIGH (ref 3.8–4.9)
Alkaline Phosphatase: 53 IU/L (ref 44–121)
Bilirubin Total: 1 mg/dL (ref 0.0–1.2)
Bilirubin, Direct: 0.29 mg/dL (ref 0.00–0.40)
Total Protein: 7.3 g/dL (ref 6.0–8.5)

## 2021-06-18 MED ORDER — ISOSORBIDE MONONITRATE ER 60 MG PO TB24: 60.0000 mg | ORAL_TABLET | Freq: Every day | ORAL | 3 refills | Status: DC

## 2021-06-18 NOTE — Progress Notes (Signed)
Cardiology Office Note:    Date:  06/18/2021   ID:  Barry Gutierrez, DOB [birth year], MRN [id number]  PCP:  Rema Fendt, NP  Cardiologist:  Parke Poisson, MD  Electrophysiologist:  None   Referring MD: No ref. provider found   Chief Complaint/Reason for Referral: CAD with PCI to the RCA  History of Present Illness:    Barry Gutierrez is a 60 y.o. male with a history of CAD and prior NSTEMI with occluded RCA, hypertension, hyperlipidemia, recently quit tobacco use, and alcohol abuse.  Presents for follow-up after unstable angina requiring coronary catheterization PCI to the RCA which had somewhat recanalized but was subtotally occluded.  He received 2 stents to the RCA in overlapping fashion, proximal and mid RCA on 11/15/20.  Chest pain improved on imdur, we discussed dose uptitration. Echo performed showing old MI with inferior regionals, and otherwise no pericardial effusion or reason for fatigue. Feeling better on plavix, did not feel well on Brilinta. Renal function improved since stopping ARB. Has been seen by [facility] lipid clinic. Was restarted on crestor but did not tolerate, felt this contributed to GI upset. [facility] working with patient to reduce alcohol use and trial statin therapy. He's on Vascepa and fibrate otherwise.  Past Medical History:  Diagnosis Date   Allergy    MI (myocardial infarction) (HCC)    S/P angioplasty with stent DES to RCA 11/15/20 11/15/2020    Past Surgical History:  Procedure Laterality Date   CORONARY STENT INTERVENTION N/A 11/15/2020   Procedure: CORONARY STENT INTERVENTION;  Surgeon: Corky Crafts, MD;  [facility]: [facility] INVASIVE CV LAB;  Service: Cardiovascular;  Laterality: N/A;   LEFT HEART CATH AND CORONARY ANGIOGRAPHY N/A 08/04/2019   Procedure: LEFT HEART CATH AND CORONARY ANGIOGRAPHY;  Surgeon: Iran Ouch, MD;  Location: [facility] INVASIVE CV LAB;  Service: Cardiovascular;  Laterality: N/A;   LEFT HEART CATH AND CORONARY ANGIOGRAPHY N/A  11/15/2020   Procedure: LEFT HEART CATH AND CORONARY ANGIOGRAPHY;  Surgeon: Corky Crafts, MD;  Location: [facility] INVASIVE CV LAB;  Service: Cardiovascular;  Laterality: N/A;    Current Medications: Current Meds  Medication Sig   acetaminophen (TYLENOL) 325 MG tablet Take 325-650 mg by mouth every 6 (six) hours as needed for mild pain (for pain).   aspirin EC 81 MG EC tablet Take 1 tablet (81 mg total) by mouth daily.   carvedilol (COREG) 12.5 MG tablet Take 1 tablet (12.5 mg total) by mouth 2 (two) times daily with a meal.   clopidogrel (PLAVIX) 75 MG tablet Take 1 tablet (75 mg total) by mouth daily. Please start 75 mg Daily on 03/30/21.   fenofibrate 160 MG tablet Take 1 tablet (160 mg total) by mouth daily.   icosapent Ethyl (VASCEPA) 1 g capsule Take 2 capsules (2 g total) by mouth 2 (two) times daily.   isosorbide mononitrate (IMDUR) 30 MG 24 hr tablet Take 1 tablet (30 mg total) by mouth daily.   nitroGLYCERIN (NITROSTAT) 0.4 MG SL tablet Place 1 tablet (0.4 mg total) under the tongue every 5 (five) minutes x 3 doses as needed for chest pain.   rosuvastatin (CRESTOR) 40 MG tablet Take 1 tablet (40 mg total) by mouth daily.     Allergies:   Patient has no known allergies.   Social History   Tobacco Use   Smoking status: Every Day    Packs/day: 2.00    Types: Cigarettes   Smokeless tobacco: Never  Substance Use Topics  Alcohol use: Yes   Drug use: Yes    Types: Marijuana    Comment: every other month or so      Family History: The patient's family history includes CAD in his father and mother; Cancer in his mother; Coronary artery disease in his father and mother; Diabetes in his father.  ROS:   Please see the history of present illness.    (+) All other systems reviewed and are negative.  EKGs/Labs/Other Studies Reviewed:    The following studies were reviewed today:  EKG: 06/18/21: NSR rate 72 05/02/21: NSR, rate 64 03/28/2021: Normal sinus rhythm rate 67,  probable LVH criteria 11/30/2020: Sinus rhythm, LVH criteria I have independently reviewed the images from left heart cath 11/15/2020.  Recent Labs: 03/28/2021: ALT 22; Hemoglobin 15.7; Platelets 184; TSH 3.290 05/04/2021: BUN 9; Creatinine, Ser 1.09; Potassium 4.4; Sodium 143  Recent Lipid Panel    Component Value Date/Time   CHOL 219 (H) 03/28/2021 0912   TRIG 244 (H) 03/28/2021 0912   HDL 67 03/28/2021 0912   CHOLHDL 3.3 03/28/2021 0912   CHOLHDL 2.8 11/15/2020 0014   VLDL 64 (H) 11/15/2020 0014   LDLCALC 110 (H) 03/28/2021 0912    Physical Exam:    VS:  BP 123/73   Pulse 72   Ht 5\' 10"  (1.778 m)   Wt 201 lb 3.2 oz (91.3 kg)   SpO2 98%   BMI 28.87 kg/m     Wt Readings from Last 5 Encounters:  06/18/21 201 lb 3.2 oz (91.3 kg)  05/04/21 198 lb 3.2 oz (89.9 kg)  05/02/21 201 lb 3.2 oz (91.3 kg)  03/28/21 197 lb 12.8 oz (89.7 kg)  11/30/20 184 lb (83.5 kg)    Constitutional: No acute distress Eyes: sclera non-icteric, normal conjunctiva and lids ENMT: normal dentition, moist mucous membranes Cardiovascular: regular rhythm, normal rate, no murmurs. S1 and S2 normal. Radial pulses normal bilaterally. No jugular venous distention.  Respiratory: clear to auscultation bilaterally GI : normal bowel sounds, soft and nontender. No distention.   MSK: extremities warm, well perfused. No edema.  NEURO: grossly nonfocal exam, moves all extremities. PSYCH: alert and oriented x 3, normal mood and affect.   ASSESSMENT:    1. Chest pain, unspecified type   2. Mixed hyperlipidemia   3. Coronary artery disease involving native coronary artery of native heart with angina pectoris (HCC)   4. NSTEMI (non-ST elevated myocardial infarction) (HCC)   5. S/P angioplasty with stent DES to RCA 11/15/20   6. AKI (acute kidney injury) (HCC)   7. Medication management   8. Essential hypertension   9. Hypertriglyceridemia   10. Fatigue, unspecified type   11. SOB (shortness of breath)      PLAN:    Chest pain SOB Fatigue AKI Essential hypertension Med management - BP stable today. Renal function has improved. Consider restarting losartan if needed for better BP control.  - SOB improved off of Brilinta. Continue Plavix 75 mg daily and ASA 81 mg daily. I have instructed the patient that dual antiplatelet therapy should be taken for 1 year without interruption.  We have discussed the consequences of interrupted dual antiplatelet therapy and the risk for in-stent thrombosis.  - chest pain improved on imdur 30 mg daily, uptitrate to 60 mg daily.  - echo shows no evidence of new cause for chest pain, sob or fatigue.  Hypertriglyceridemia-consider [id number], improved labs on Vascepa and fibrate. Statin challenging given patient intolerance. Managed by [facility] at this time.  Total time of encounter: 30 minutes total time of encounter, including 18 minutes spent in face-to-face patient care on the date of this encounter. This time includes coordination of care and counseling regarding above mentioned problem list. Remainder of non-face-to-face time involved reviewing chart documents/testing relevant to the patient encounter and documentation in the medical record. I have independently reviewed documentation from referring provider.   [name], MD, [facility] [place]  [facility]     Medication Adjustments/Labs and Tests Ordered: Current medicines are reviewed at length with the patient today.  Concerns regarding medicines are outlined above.   No orders of the defined types were placed in this encounter.    No orders of the defined types were placed in this encounter.    Patient Instructions  Medication Instructions:  Increase Imdur to 60 mg daily   *If you need a refill on your cardiac medications before your next appointment, please call your pharmacy*   Lab Work: LIPID today- (moving this up from [facility])  If you have labs (blood work) drawn today and  your tests are completely normal, you will receive your results only by: MyChart Message (if you have MyChart) OR A paper copy in the mail If you have any lab test that is abnormal or we need to change your treatment, we will call you to review the results.   Follow-Up: At [facility], you and your health needs are our priority.  As part of our continuing mission to provide you with exceptional heart care, we have created designated Provider Care Teams.  These Care Teams include your primary Cardiologist (physician) and Advanced Practice Providers (APPs -  Physician Assistants and Nurse Practitioners) who all work together to provide you with the care you need, when you need it.  We recommend signing up for the patient portal called "MyChart".  Sign up information is provided on this After Visit Summary.  MyChart is used to connect with patients for Virtual Visits (Telemedicine).  Patients are able to view lab/test results, encounter notes, upcoming appointments, etc.  Non-urgent messages can be sent to your provider as well.   To learn more about what you can do with MyChart, go to [email or web address].    Your next appointment:   6 month(s)  The format for your next appointment:   In Person  Provider:   [name], MD

## 2021-06-18 NOTE — Patient Instructions (Signed)
Medication Instructions:  Increase Imdur to 60 mg daily   *If you need a refill on your cardiac medications before your next appointment, please call your pharmacy*   Lab Work: LIPID today- (moving this up from [facility])  If you have labs (blood work) drawn today and your tests are completely normal, you will receive your results only by: MyChart Message (if you have MyChart) OR A paper copy in the mail If you have any lab test that is abnormal or we need to change your treatment, we will call you to review the results.   Follow-Up: At [facility], you and your health needs are our priority.  As part of our continuing mission to provide you with exceptional heart care, we have created designated Provider Care Teams.  These Care Teams include your primary Cardiologist (physician) and Advanced Practice Providers (APPs -  Physician Assistants and Nurse Practitioners) who all work together to provide you with the care you need, when you need it.  We recommend signing up for the patient portal called "MyChart".  Sign up information is provided on this After Visit Summary.  MyChart is used to connect with patients for Virtual Visits (Telemedicine).  Patients are able to view lab/test results, encounter notes, upcoming appointments, etc.  Non-urgent messages can be sent to your provider as well.   To learn more about what you can do with MyChart, go to [email or web address].    Your next appointment:   6 month(s)  The format for your next appointment:   In Person  Provider:   [name], MD

## 2021-06-19 ENCOUNTER — Telehealth: Payer: Self-pay | Admitting: Pharmacist

## 2021-06-19 NOTE — Telephone Encounter (Signed)
Left message for pt to discuss lipid panel results.   Labs 2 months ago reflect pt just on fenofibrate, he had stopped taking his statin unknowingly. I restarted rosuvastatin 40mg  daily and added Vascepa. His TG have improved from 244 to 100, however his LDL has only dropped by 18% and typically would expect to see ~55% reduction with rosuvastatin 40mg  daily. Left message for pt to discuss - will clarify adherence to statin and can add Praluent if needed (prior authorization already approved). LFTs mildly elevated, pt previously drinking 3-4 alcoholic drinks per day which he was advised to decrease, will follow up with this as well when pt returns call.

## 2021-06-20 ENCOUNTER — Other Ambulatory Visit: Payer: Self-pay

## 2021-06-20 ENCOUNTER — Ambulatory Visit (INDEPENDENT_AMBULATORY_CARE_PROVIDER_SITE_OTHER): Payer: BC Managed Care – PPO | Admitting: Family

## 2021-06-20 ENCOUNTER — Encounter: Payer: Self-pay | Admitting: Family

## 2021-06-20 VITALS — BP 123/75 | HR 74 | Resp 16 | Wt 198.0 lb

## 2021-06-20 DIAGNOSIS — N529 Male erectile dysfunction, unspecified: Secondary | ICD-10-CM | POA: Diagnosis not present

## 2021-06-20 DIAGNOSIS — Z0289 Encounter for other administrative examinations: Secondary | ICD-10-CM | POA: Diagnosis not present

## 2021-06-20 NOTE — Progress Notes (Signed)
Patient ID: Barry Gutierrez, male    DOB: Jun 19, 1961  MRN: [id number]  CC: Disability Paperwork  Subjective: Barry Gutierrez is a 60 y.o. male who presents for disability paperwork.   His concerns today include:  DISABILITY PAPERWORK: [facility] 05/25/2021: - Completed Disability paperwork today with patient.  - Counseled patient Cardiology will need to medically release him back to work if appropriate. Patient agreeable and plans to discuss with Cardiology at his next appointment on June 18, 2021.   Visit 06/18/2021 at [facility] per MD note: Chest pain SOB Fatigue AKI - will order an echo today, need to evaluate for pericardial effusion and pericarditis in the setting of sharp chest pain, however chest pain is nitro responsive and I am more concerned for angina, will need evaluation of regional wall motion abnormality, may need Definity contrast if images not clear. - will start imdur 30 mg daily -Continue dual antiplatelet therapy. I have instructed the patient that dual antiplatelet therapy should be taken for 1 year without interruption.  We have discussed the consequences of interrupted dual antiplatelet therapy and the risk for in-stent thrombosis.  -We switched from Brilinta to Plavix due to shortness of breath.  This has helped some. -Continue beta-blocker, statin - held ARB since 8/26, will recheck labs on 05/04/21. Cr up from normal baseline to 1.66 at last check.  He is establishing with a new primary care NP on Friday, I would request labs to be performed at that visit to evaluate for renal function.   Essential hypertension -blood pressure mildly elevated today, continue carvedilol. Losartan on hold for AKI.  Needs repeat lab studies prior to resuming losartan, however I suspect we can resume that soon if renal function returns to normal.   Hypertriglyceridemia- LDL and triglycerides both above goal, as is VLDL, needs further lipid control. Will  refer to [facility] lipid clinic.  He is in agreement, would be a great candidate for PCSK9 inhibitor.  [facility] 06/20/2021: Reports Cardiology has cleared him to return to work. Reports infrequent chest pain and seldom uses Nitroglycerin. However, today requesting extension of disability through July 03, 2021 related to recent side effect from cholesterol medication. Reports previous cholesterol medication made him hungry but then when he would go to eat he would become sick and dry heave. Reports the medication also made it difficult for him to rest during nighttime. Reports at most recent appointment with Cardiology cholesterol medication was changed and now taking a weekly injection cholesterol medication in replacement of oral cholesterol medication. Reports otherwise feeling ok. Also, requesting medication for erectile dysfunction. Reports can get an erection but does not last and sometimes not erect enough for penetration.   Patient Active Problem List   Diagnosis Date Noted   S/P angioplasty with stent DES to RCA 11/15/20 11/15/2020   Alcohol withdrawal delirium (HCC) 08/04/2019   Hyperlipidemia 08/04/2019   CAD (coronary artery disease) 08/04/2019   Unstable angina (HCC) 08/03/2019   Essential hypertension    Alcohol abuse    NSTEMI (non-ST elevated myocardial infarction) (HCC) 07/31/2019   Diverticulosis of colon 02/05/2018   Upper airway cough syndrome 11/13/2017   Cigarette smoker 11/13/2017   Asthmatic bronchitis with acute exacerbation 11/12/2017     Current Outpatient Medications on File Prior to Visit  Medication Sig Dispense Refill   acetaminophen (TYLENOL) 325 MG tablet Take 325-650 mg by mouth every 6 (six) hours as needed for mild pain (for pain).  aspirin EC 81 MG EC tablet Take 1 tablet (81 mg total) by mouth daily. 90 tablet 3   carvedilol (COREG) 12.5 MG tablet Take 1 tablet (12.5 mg total) by mouth 2 (two) times daily with a meal. 180 tablet 3    clopidogrel (PLAVIX) 75 MG tablet Take 1 tablet (75 mg total) by mouth daily. Please start 75 mg Daily on 03/30/21. 90 tablet 3   fenofibrate 160 MG tablet Take 1 tablet (160 mg total) by mouth daily. 90 tablet 3   icosapent Ethyl (VASCEPA) 1 g capsule Take 2 capsules (2 g total) by mouth 2 (two) times daily. 360 capsule 3   isosorbide mononitrate (IMDUR) 60 MG 24 hr tablet Take 1 tablet (60 mg total) by mouth daily. 90 tablet 3   nitroGLYCERIN (NITROSTAT) 0.4 MG SL tablet Place 1 tablet (0.4 mg total) under the tongue every 5 (five) minutes x 3 doses as needed for chest pain. 25 tablet 1   rosuvastatin (CRESTOR) 40 MG tablet Take 1 tablet (40 mg total) by mouth daily. (Patient not taking: Reported on 06/20/2021) 90 tablet 3   No current facility-administered medications on file prior to visit.    No Known Allergies  Social History   Socioeconomic History   Marital status: Legally Separated    Spouse name: Not on file   Number of children: Not on file   Years of education: Not on file   Highest education level: Not on file  Occupational History   Not on file  Tobacco Use   Smoking status: Every Day    Packs/day: 2.00    Types: Cigarettes   Smokeless tobacco: Never  Substance and Sexual Activity   Alcohol use: Yes   Drug use: Yes    Types: Marijuana    Comment: every other month or so    Sexual activity: Not Currently  Other Topics Concern   Not on file  Social History Narrative   Not on file   Social Determinants of Health   Financial Resource Strain: Not on file  Food Insecurity: Not on file  Transportation Needs: Not on file  Physical Activity: Not on file  Stress: Not on file  Social Connections: Not on file  Intimate Partner Violence: Not on file    Family History  Problem Relation Age of Onset   Cancer Mother    CAD Mother    Coronary artery disease Mother    Diabetes Father    CAD Father    Coronary artery disease Father     Past Surgical History:   Procedure Laterality Date   CORONARY STENT INTERVENTION N/A 11/15/2020   Procedure: CORONARY STENT INTERVENTION;  Surgeon: Corky Crafts, MD;  Location: [facility] INVASIVE [facility];  Service: Cardiovascular;  Laterality: N/A;   LEFT HEART CATH AND CORONARY ANGIOGRAPHY N/A 08/04/2019   Procedure: LEFT HEART CATH AND CORONARY ANGIOGRAPHY;  Surgeon: Iran Ouch, MD;  Location: [facility] CV [facility];  Service: Cardiovascular;  Laterality: N/A;   LEFT HEART CATH AND CORONARY ANGIOGRAPHY N/A 11/15/2020   Procedure: LEFT HEART CATH AND CORONARY ANGIOGRAPHY;  Surgeon: Corky Crafts, MD;  Location: [facility] INVASIVE CV LAB;  Service: Cardiovascular;  Laterality: N/A;    ROS: Review of Systems Negative except as stated above  PHYSICAL EXAM: BP 123/75 (BP Location: Right Arm, Patient Position: Sitting, Cuff Size: Large)   Pulse 74   Resp 16   Wt 198 lb (89.8 kg)   SpO2 96%   BMI 28.41 kg/m  Physical Exam HENT:     Head: Normocephalic and atraumatic.  Eyes:     Extraocular Movements: Extraocular movements intact.     Conjunctiva/sclera: Conjunctivae normal.     Pupils: Pupils are equal, round, and reactive to light.  Cardiovascular:     Rate and Rhythm: Normal rate and regular rhythm.     Pulses: Normal pulses.     Heart sounds: Normal heart sounds.  Pulmonary:     Effort: Pulmonary effort is normal.     Breath sounds: Normal breath sounds.  Musculoskeletal:     Cervical back: Normal range of motion and neck supple.  Neurological:     General: No focal deficit present.     Mental Status: He is alert and oriented to person, place, and time.  Psychiatric:        Mood and Affect: Mood normal.        Behavior: Behavior normal.    ASSESSMENT AND PLAN: 1. Encounter for completion of form with patient: - Completed Disability paperwork extension today with patient through July 16, 2021 with possible return to work with restrictions.  - Counseled patient Cardiology will need to  medically release him back to work without restrictions if appropriate and to confirm if current restrictions are appropriate from specialist standpoint. Counseled that he should discuss this in more detail at his next appointment on June 26, 2021. At that time I can update any required paperwork. Patient agreeable.   2. Erectile dysfunction, unspecified erectile dysfunction type: - Counseled patient that I will be unable to prescribe him medication for erectile dysfunction as he is currently prescribed Nitroglycerin and Isosorbide Mononitrate per Cardiology. Will need approval per Cardiology before prescribed. Counseled patient to discuss in more detail at next appointment with the same on June 26, 2021.     Patient was given the opportunity to ask questions.  Patient verbalized understanding of the plan and was able to repeat key elements of the plan. Patient was given clear instructions to go to Emergency Department or return to medical center if symptoms don't improve, worsen, or new problems develop.The patient verbalized understanding.  Follow-up with primary provider   Radwan Cowley Jodi Geralds, NP

## 2021-06-21 ENCOUNTER — Other Ambulatory Visit: Payer: Self-pay | Admitting: Family

## 2021-06-21 NOTE — Progress Notes (Signed)
On today consulted with patients cardiologist Dr. [name]. Informed that from a cardiologist standpoint patient has no limitations and no recommendations for disability or leave.

## 2021-06-25 ENCOUNTER — Encounter: Payer: Self-pay | Admitting: Pharmacist

## 2021-06-25 MED ORDER — ATORVASTATIN CALCIUM 20 MG PO TABS: 20.0000 mg | ORAL_TABLET | Freq: Every day | ORAL | 5 refills | Status: DC

## 2021-06-25 NOTE — Telephone Encounter (Signed)
Pt returned call to clinic. Reports adherence with Vascepa and fenofibrate. States he stopped rosuvastatin because it made him "sick as a dog" within a week. Felt hungry all the time but when he would eat he would feel sick and dry heave and then couldn't eat. Also reports drinking 4-5 alcoholic drinks per day which likely explains the increase in his LFTs.  Encouraged pt to reduce drinking. Will rechallenge with lower dose of atorvastatin 20mg  daily as he has never tried another statin aside from rosuvastatin 40mg  and his symptoms are not commonly seen with statins. If he doesn't tolerate atorvastatin better, will start Praluent.

## 2021-06-26 ENCOUNTER — Other Ambulatory Visit: Payer: BC Managed Care – PPO

## 2021-07-09 ENCOUNTER — Telehealth: Payer: Self-pay | Admitting: Pharmacist

## 2021-07-09 DIAGNOSIS — E782 Mixed hyperlipidemia: Secondary | ICD-10-CM

## 2021-07-09 NOTE — Telephone Encounter (Signed)
Called pt and left message to follow up with atorvastatin 20mg  tolerability. Previously intolerant to rosuvastatin 40mg  daily (felt hungry but nauseous all the time). LFTs also previously elevated due to pt drinking 4-5 alcoholic drinks each day, he was advised to decrease this. If pt not tolerating atorvastatin, can change to Praluent (PA already approved) and schedule f/u labs.

## 2021-07-09 NOTE — Telephone Encounter (Signed)
Pt returned call to clinic. States he is experiencing the same side effect with atorvastatin that he did with rosuvastatin of feeling "hungry all the time but can't eat." Thinks it's about 50% better on atorvastatin than rosuvastatin though, especially when he started cutting his tablets in half which he started doing 3-4 days ago. He's willing to continue on current meds, will recheck lipids in 1 month. Can add on ezetimibe/Nexlizet/PCSK9i if needed. Still drinking, goes back to work on Monday and plans to cut back then. Will keep a close eye on LFTs.

## 2021-07-31 NOTE — Progress Notes (Signed)
Patient ID: Barry Gutierrez, male    DOB: 1960/10/05  MRN: [id number]  CC: STD Screening   Subjective: Barry Gutierrez is a 60 y.o. male who presents for STD screening.   His concerns today include:  STD SCREENING: States "It doesn't feel the same down there."  PAPERWORK FOLLOW-UP: Reports hand shaking 7 days ago and ended 3 days ago. Reports decreased appetite even though having increased  hunger. Thinks the decreased appetite verus increased hunger may be related to cholesterol medication or low blood sugars. Scheduled to see Cardiology 08/06/2021. Asking for extension of Disability paperwork today as he has not returned to work since previous appointment.  Patient Active Problem List   Diagnosis Date Noted   S/P angioplasty with stent DES to RCA 11/15/20 11/15/2020   Alcohol withdrawal delirium (HCC) 08/04/2019   Hyperlipidemia 08/04/2019   CAD (coronary artery disease) 08/04/2019   Unstable angina (HCC) 08/03/2019   Essential hypertension    Alcohol abuse    NSTEMI (non-ST elevated myocardial infarction) (HCC) 07/31/2019   Diverticulosis of colon 02/05/2018   Upper airway cough syndrome 11/13/2017   Cigarette smoker 11/13/2017   Asthmatic bronchitis with acute exacerbation 11/12/2017     Current Outpatient Medications on File Prior to Visit  Medication Sig Dispense Refill   acetaminophen (TYLENOL) 325 MG tablet Take 325-650 mg by mouth every 6 (six) hours as needed for mild pain (for pain).     aspirin EC 81 MG EC tablet Take 1 tablet (81 mg total) by mouth daily. 90 tablet 3   atorvastatin (LIPITOR) 20 MG tablet Take 1 tablet (20 mg total) by mouth daily. 30 tablet 5   carvedilol (COREG) 12.5 MG tablet Take 1 tablet (12.5 mg total) by mouth 2 (two) times daily with a meal. 180 tablet 3   clopidogrel (PLAVIX) 75 MG tablet Take 1 tablet (75 mg total) by mouth daily. Please start 75 mg Daily on 03/30/21. 90 tablet 3   fenofibrate 160 MG tablet Take 1 tablet (160 mg total) by  mouth daily. 90 tablet 3   icosapent Ethyl (VASCEPA) 1 g capsule Take 2 capsules (2 g total) by mouth 2 (two) times daily. 360 capsule 3   isosorbide mononitrate (IMDUR) 60 MG 24 hr tablet Take 1 tablet (60 mg total) by mouth daily. 90 tablet 3   nitroGLYCERIN (NITROSTAT) 0.4 MG SL tablet Place 1 tablet (0.4 mg total) under the tongue every 5 (five) minutes x 3 doses as needed for chest pain. 25 tablet 1   No current facility-administered medications on file prior to visit.    Allergies  Allergen Reactions   Rosuvastatin     Hungry and nauseous    Social History   Socioeconomic History   Marital status: Legally Separated    Spouse name: Not on file   Number of children: Not on file   Years of education: Not on file   Highest education level: Not on file  Occupational History   Not on file  Tobacco Use   Smoking status: Every Day    Packs/day: 2.00    Types: Cigarettes   Smokeless tobacco: Never  Substance and Sexual Activity   Alcohol use: Yes   Drug use: Yes    Types: Marijuana    Comment: every other month or so    Sexual activity: Not Currently  Other Topics Concern   Not on file  Social History Narrative   Not on file   Social Determinants of Health  Financial Resource Strain: Not on file  Food Insecurity: Not on file  Transportation Needs: Not on file  Physical Activity: Not on file  Stress: Not on file  Social Connections: Not on file  Intimate Partner Violence: Not on file    Family History  Problem Relation Age of Onset   Cancer Mother    CAD Mother    Coronary artery disease Mother    Diabetes Father    CAD Father    Coronary artery disease Father     Past Surgical History:  Procedure Laterality Date   CORONARY STENT INTERVENTION N/A 11/15/2020   Procedure: CORONARY STENT INTERVENTION;  Surgeon: Corky Crafts, MD;  Location: [facility] INVASIVE CV [facility];  Service: Cardiovascular;  Laterality: N/A;   LEFT HEART CATH AND CORONARY ANGIOGRAPHY N/A  08/04/2019   Procedure: LEFT HEART CATH AND CORONARY ANGIOGRAPHY;  Surgeon: Iran Ouch, MD;  Location: [facility] CV [facility];  Service: Cardiovascular;  Laterality: N/A;   LEFT HEART CATH AND CORONARY ANGIOGRAPHY N/A 11/15/2020   Procedure: LEFT HEART CATH AND CORONARY ANGIOGRAPHY;  Surgeon: Corky Crafts, MD;  Location: [facility] INVASIVE CV LAB;  Service: Cardiovascular;  Laterality: N/A;    ROS: Review of Systems Negative except as stated above  PHYSICAL EXAM: BP 127/76 (BP Location: Left Arm, Patient Position: Sitting, Cuff Size: Large)   Pulse 75   Temp 98.3 F (36.8 C)   Resp 18   Ht 5\' 10"  (1.778 m)   Wt 193 lb 9.6 oz (87.8 kg)   SpO2 95%   BMI 27.78 kg/m   Physical Exam HENT:     Head: Normocephalic and atraumatic.  Eyes:     Extraocular Movements: Extraocular movements intact.     Conjunctiva/sclera: Conjunctivae normal.     Pupils: Pupils are equal, round, and reactive to light.  Cardiovascular:     Rate and Rhythm: Normal rate and regular rhythm.     Pulses: Normal pulses.     Heart sounds: Normal heart sounds.  Pulmonary:     Effort: Pulmonary effort is normal.     Breath sounds: Normal breath sounds.  Musculoskeletal:     Cervical back: Normal range of motion and neck supple.  Neurological:     General: No focal deficit present.     Mental Status: He is alert and oriented to person, place, and time.  Psychiatric:        Mood and Affect: Mood normal.        Behavior: Behavior normal.   Results for orders placed or performed in visit on 08/01/21  Glucose (CBG)  Result Value Ref Range   POC Glucose 95 70 - 99 mg/dl    ASSESSMENT AND PLAN: 1. Screening for STD (sexually transmitted disease): - Urine cytology to screen for gonorrhea, chlamydia, and trichomonas.  - Urine cytology ancillary only  2. Encounter for completion of form with patient: - Counseled patient I will be unable to extend Disability paperwork at this time after consulting with his  Cardiologist on 06/21/2021 which indicated patient has no limitations and no recommendations for disability or leave. Patient verbalized understanding.   3. Diabetes mellitus screening: - Blood sugar today normal at 95. Last hemoglobin A1c 5.0 on 11/15/2020.  - Glucose (CBG)   Patient was given the opportunity to ask questions.  Patient verbalized understanding of the plan and was able to repeat key elements of the plan. Patient was given clear instructions to go to Emergency Department or return to medical center  if symptoms don't improve, worsen, or new problems develop.The patient verbalized understanding.   Orders Placed This Encounter  Procedures   Glucose (CBG)    Follow-up with primary provider as scheduled.  Rema Fendt, NP

## 2021-08-01 ENCOUNTER — Other Ambulatory Visit: Payer: Self-pay | Admitting: Family

## 2021-08-01 ENCOUNTER — Encounter: Payer: Self-pay | Admitting: Family

## 2021-08-01 ENCOUNTER — Ambulatory Visit (INDEPENDENT_AMBULATORY_CARE_PROVIDER_SITE_OTHER): Payer: BC Managed Care – PPO | Admitting: Family

## 2021-08-01 ENCOUNTER — Other Ambulatory Visit (HOSPITAL_COMMUNITY)
Admission: RE | Admit: 2021-08-01 | Discharge: 2021-08-01 | Disposition: A | Discharge status: Home / Self Care | Payer: BC Managed Care – PPO | Source: Ambulatory Visit | Attending: Family | Admitting: Family

## 2021-08-01 ENCOUNTER — Other Ambulatory Visit: Payer: Self-pay

## 2021-08-01 VITALS — BP 127/76 | HR 75 | Temp 98.3°F | Resp 18 | Ht 70.0 in | Wt 193.6 lb

## 2021-08-01 DIAGNOSIS — Z0289 Encounter for other administrative examinations: Secondary | ICD-10-CM

## 2021-08-01 DIAGNOSIS — R300 Dysuria: Secondary | ICD-10-CM | POA: Diagnosis not present

## 2021-08-01 DIAGNOSIS — Z113 Encounter for screening for infections with a predominantly sexual mode of transmission: Secondary | ICD-10-CM

## 2021-08-01 DIAGNOSIS — Z131 Encounter for screening for diabetes mellitus: Secondary | ICD-10-CM | POA: Diagnosis not present

## 2021-08-01 DIAGNOSIS — N3000 Acute cystitis without hematuria: Secondary | ICD-10-CM

## 2021-08-01 LAB — POCT URINALYSIS DIP (CLINITEK)
Bilirubin, UA: NEGATIVE
Blood, UA: NEGATIVE
Glucose, UA: NEGATIVE mg/dL
Ketones, POC UA: NEGATIVE mg/dL
Leukocytes, UA: NEGATIVE
Nitrite, UA: POSITIVE — AB
POC PROTEIN,UA: NEGATIVE
Spec Grav, UA: >=1.03 — AB (ref 1.010–1.025)
Urobilinogen, UA: 1 E.U./dL
pH, UA: 5.5 (ref 5.0–8.0)

## 2021-08-01 LAB — GLUCOSE, POCT (MANUAL RESULT ENTRY): POC Glucose: 95 mg/dl (ref 70–99)

## 2021-08-01 MED ORDER — NITROFURANTOIN MONOHYD MACRO 100 MG PO CAPS: 100.0000 mg | ORAL_CAPSULE | Freq: Two times a day (BID) | ORAL | 0 refills | Status: AC

## 2021-08-01 NOTE — Progress Notes (Signed)
Pt presents for extension of being out of work, stated had a drop in blood sugar levels, stated that has had an poor appetite lately, Desires STD testing

## 2021-08-01 NOTE — Progress Notes (Signed)
Please call patient with update.   Positive for urinary tract infection. Prescribed Macrobid for treatment.

## 2021-08-01 NOTE — Addendum Note (Signed)
Addended by: Margorie John on: 08/01/2021 04:43 PM   Modules accepted: Orders

## 2021-08-01 NOTE — Progress Notes (Signed)
Blood sugar normal.

## 2021-08-02 LAB — URINE CYTOLOGY ANCILLARY ONLY
Chlamydia: NEGATIVE
Comment: NEGATIVE
Comment: NEGATIVE
Comment: NORMAL
Neisseria Gonorrhea: NEGATIVE
Trichomonas: NEGATIVE

## 2021-08-02 NOTE — Progress Notes (Signed)
Please call patient with update.   Gonorrhea, Chlamydia, and Trichomonas negative.

## 2021-08-06 ENCOUNTER — Other Ambulatory Visit: Payer: BC Managed Care – PPO

## 2021-08-13 ENCOUNTER — Telehealth: Payer: Self-pay | Admitting: Pharmacist

## 2021-08-13 NOTE — Telephone Encounter (Signed)
Called pt to r/s lab work that he canceled last week. He states he is working again because his disability was not approved for continuation and he has to drive to [place] each morning. States he does not know when he can come in for labs. Advised him to call us to r/s when he is able.

## 2021-09-22 ENCOUNTER — Other Ambulatory Visit: Payer: Self-pay

## 2021-09-22 ENCOUNTER — Ambulatory Visit
Admission: RE | Admit: 2021-09-22 | Discharge: 2021-09-22 | Disposition: A | Discharge status: Home / Self Care | Payer: BC Managed Care – PPO | Source: Ambulatory Visit | Attending: Physician Assistant | Admitting: Physician Assistant

## 2021-09-22 VITALS — BP 114/77 | HR 79 | Temp 98.0°F | Resp 18

## 2021-09-22 DIAGNOSIS — F1010 Alcohol abuse, uncomplicated: Secondary | ICD-10-CM

## 2021-09-22 DIAGNOSIS — R8290 Unspecified abnormal findings in urine: Secondary | ICD-10-CM

## 2021-09-22 LAB — POCT URINALYSIS DIP (MANUAL ENTRY)
Bilirubin, UA: NEGATIVE
Blood, UA: NEGATIVE
Glucose, UA: NEGATIVE mg/dL
Ketones, POC UA: NEGATIVE mg/dL
Leukocytes, UA: NEGATIVE
Nitrite, UA: NEGATIVE
Protein Ur, POC: NEGATIVE mg/dL
Spec Grav, UA: 1.01 (ref 1.010–1.025)
Urobilinogen, UA: >=8 E.U./dL — AB
pH, UA: 5.5 (ref 5.0–8.0)

## 2021-09-22 NOTE — ED Provider Notes (Signed)
[facility]    CSN: [id number] Arrival date & time: 09/22/21  1038      History   Chief Complaint Chief Complaint  Patient presents with   Weakness    HPI AHIL RAFFO is a 61 y.o. male.   Patient here today with multiple concerns. He reports that this week he has felt very fatigued and overall weak. He has not had fever. He reports for the last three months he has had tremor that seems to be worsening despite trying to cut down alcohol consumption. He reports he currently drinks 3-4 bourbon drinks per night. He has been drinking alcohol since age 50. He notes that recently he has started to have a darker, stronger smelling urine and did have UTI 3 months ago when other symptoms started. He reports today he is feeling better than he has all week. He does have PMH significant for NSTEMI and cardiac cath. He feels some of his symptoms are related to his medications.   The history is provided by the patient.  Weakness Associated symptoms: nausea   Associated symptoms: no fever, no shortness of breath and no vomiting    Past Medical History:  Diagnosis Date   Allergy    MI (myocardial infarction) ([place])    S/P angioplasty with stent DES to RCA 11/15/20 11/15/2020    Patient Active Problem List   Diagnosis Date Noted   S/P angioplasty with stent DES to RCA 11/15/20 11/15/2020   Alcohol withdrawal delirium ([place]) 08/04/2019   Hyperlipidemia 08/04/2019   CAD (coronary artery disease) 08/04/2019   Unstable angina ([place]) 08/03/2019   Essential hypertension    Alcohol abuse    NSTEMI (non-ST elevated myocardial infarction) ([place]) 07/31/2019   Diverticulosis of colon 02/05/2018   Upper airway cough syndrome 11/13/2017   Cigarette smoker 11/13/2017   Asthmatic bronchitis with acute exacerbation 11/12/2017    Past Surgical History:  Procedure Laterality Date   CORONARY STENT INTERVENTION N/A 11/15/2020   Procedure: CORONARY STENT INTERVENTION;  Surgeon: Jettie Booze, MD;  Location: [facility];  Service: Cardiovascular;  Laterality: N/A;   LEFT HEART CATH AND CORONARY ANGIOGRAPHY N/A 08/04/2019   Procedure: LEFT HEART CATH AND CORONARY ANGIOGRAPHY;  Surgeon: Wellington Hampshire, MD;  Location: [facility];  Service: Cardiovascular;  Laterality: N/A;   LEFT HEART CATH AND CORONARY ANGIOGRAPHY N/A 11/15/2020   Procedure: LEFT HEART CATH AND CORONARY ANGIOGRAPHY;  Surgeon: Jettie Booze, MD;  Location: [facility];  Service: Cardiovascular;  Laterality: N/A;       Home Medications    Prior to Admission medications   Medication Sig Start Date End Date Taking? Authorizing Provider  acetaminophen (TYLENOL) 325 MG tablet Take 325-650 mg by mouth every 6 (six) hours as needed for mild pain (for pain).    [provider]  aspirin EC 81 MG EC tablet Take 1 tablet (81 mg total) by mouth daily. 08/05/19   Furth, Cadence H, PA-C  atorvastatin (LIPITOR) 20 MG tablet Take 1 tablet (20 mg total) by mouth daily. 06/25/21   Elouise Munroe, MD  carvedilol (COREG) 12.5 MG tablet Take 1 tablet (12.5 mg total) by mouth 2 (two) times daily with a meal. 12/14/20   Elouise Munroe, MD  clopidogrel (PLAVIX) 75 MG tablet Take 1 tablet (75 mg total) by mouth daily. Please start 75 mg Daily on 03/30/21. 03/28/21   Elouise Munroe, MD  fenofibrate 160 MG tablet Take 1  tablet (160 mg total) by mouth daily. 05/21/21   Elouise Munroe, MD  icosapent Ethyl (VASCEPA) 1 g capsule Take 2 capsules (2 g total) by mouth 2 (two) times daily. 05/21/21   Elouise Munroe, MD  isosorbide mononitrate (IMDUR) 60 MG 24 hr tablet Take 1 tablet (60 mg total) by mouth daily. 06/18/21 09/16/21  Elouise Munroe, MD  nitroGLYCERIN (NITROSTAT) 0.4 MG SL tablet Place 1 tablet (0.4 mg total) under the tongue every 5 (five) minutes x 3 doses as needed for chest pain. 11/15/20   Isaiah Serge, NP    Family History Family History  Problem Relation Age of  Onset   Cancer Mother    CAD Mother    Coronary artery disease Mother    Diabetes Father    CAD Father    Coronary artery disease Father     Social History Social History   Tobacco Use   Smoking status: Every Day    Packs/day: 2.00    Types: Cigarettes   Smokeless tobacco: Never  Substance Use Topics   Alcohol use: Yes   Drug use: Yes    Types: Marijuana    Comment: every other month or so      Allergies   Rosuvastatin   Review of Systems Review of Systems  Constitutional:  Negative for chills and fever.  Eyes:  Negative for discharge and redness.  Respiratory:  Negative for shortness of breath.   Gastrointestinal:  Positive for nausea. Negative for vomiting.  Genitourinary:        Positive for dark urine  Neurological:  Positive for weakness.    Physical Exam Triage Vital Signs ED Triage Vitals  Enc Vitals Group     BP 09/22/21 1058 114/77     Pulse Rate 09/22/21 1058 79     Resp 09/22/21 1058 18     Temp 09/22/21 1058 98 F (36.7 C)     Temp Source 09/22/21 1058 Oral     SpO2 09/22/21 1058 96 %     Weight --      Height --      Head Circumference --      Peak Flow --      Pain Score 09/22/21 1100 5     Pain Loc --      Pain Edu? --      Excl. in [place]? --    No data found.  Updated Vital Signs BP 114/77 (BP Location: Left Arm)    Pulse 79    Temp 98 F (36.7 C) (Oral)    Resp 18    SpO2 96%      Physical Exam Vitals and nursing note reviewed.  Constitutional:      General: He is not in acute distress.    Appearance: Normal appearance. He is not ill-appearing.  HENT:     Head: Normocephalic and atraumatic.  Eyes:     Conjunctiva/sclera: Conjunctivae normal.  Cardiovascular:     Rate and Rhythm: Normal rate and regular rhythm.     Heart sounds: Normal heart sounds. No murmur heard. Pulmonary:     Effort: Pulmonary effort is normal. No respiratory distress.     Breath sounds: Normal breath sounds. No wheezing, rhonchi or rales.   Neurological:     Mental Status: He is alert.  Psychiatric:        Mood and Affect: Mood normal.        Behavior: Behavior normal.  Thought Content: Thought content normal.     UC Treatments / Results  Labs (all labs ordered are listed, but only abnormal results are displayed) Labs Reviewed  POCT URINALYSIS DIP (MANUAL ENTRY) - Abnormal; Notable for the following components:      Result Value   Urobilinogen, UA >=8.0 (*)    All other components within normal limits  CBC WITH DIFFERENTIAL/PLATELET  COMPREHENSIVE METABOLIC PANEL    EKG   Radiology No results found.  Procedures Procedures (including critical care time)  Medications Ordered in UC Medications - No data to display  Initial Impression / Assessment and Plan / UC Course  I have reviewed the triage vital signs and the nursing notes.  Pertinent labs & imaging results that were available during my care of the patient were reviewed by me and considered in my medical decision making (see chart for details).   Given significant level of urobilinogen in urine recommended further evaluation in the ED. Patient does not wish to go to ED today because he reports he is feeling better. We will order other labs and await results but ultimately discussed concerns for liver issues vs possible anemia. Patient advised to report to ED with any worsening symptoms while awaiting results.    Final Clinical Impressions(s) / UC Diagnoses   Final diagnoses:  Abnormal urine findings     Discharge Instructions       Please follow up in ED with any worsening symptoms. Will await results for further recommendation.        ED Prescriptions   None    PDMP not reviewed this encounter.   Francene Finders, PA-C 09/22/21 1312

## 2021-09-22 NOTE — ED Triage Notes (Signed)
Pt here for generalized weakness and shaky x 3 months; pt sts strong smelling urine with hx of UTI

## 2021-09-22 NOTE — Discharge Instructions (Signed)
°  Please follow up in ED with any worsening symptoms. Will await results for further recommendation.

## 2021-09-25 LAB — COMPREHENSIVE METABOLIC PANEL
ALT: 42 IU/L (ref 0–44)
AST: 51 IU/L — ABNORMAL HIGH (ref 0–40)
Albumin/Globulin Ratio: 2.4 — ABNORMAL HIGH (ref 1.2–2.2)
Albumin: 5 g/dL — ABNORMAL HIGH (ref 3.8–4.9)
Alkaline Phosphatase: 48 IU/L (ref 44–121)
BUN/Creatinine Ratio: 11 (ref 9–20)
BUN: 11 mg/dL (ref 6–24)
Bilirubin Total: 1 mg/dL (ref 0.0–1.2)
CO2: 21 mmol/L (ref 20–29)
Calcium: 9.8 mg/dL (ref 8.7–10.2)
Chloride: 94 mmol/L — ABNORMAL LOW (ref 96–106)
Creatinine, Ser: 1.02 mg/dL (ref 0.76–1.27)
Globulin, Total: 2.1 g/dL (ref 1.5–4.5)
Glucose: 97 mg/dL (ref 70–99)
Potassium: 3.1 mmol/L — ABNORMAL LOW (ref 3.5–5.2)
Sodium: 135 mmol/L (ref 134–144)
Total Protein: 7.1 g/dL (ref 6.0–8.5)
eGFR: 85 mL/min/{1.73_m2} (ref >59)

## 2021-09-25 LAB — CBC WITH DIFFERENTIAL/PLATELET
Basophils Absolute: 0.1 10*3/uL (ref 0.0–0.2)
Basos: 2 %
EOS (ABSOLUTE): 0.2 10*3/uL (ref 0.0–0.4)
Eos: 4 %
Hematocrit: 40.5 % (ref 37.5–51.0)
Hemoglobin: 14.7 g/dL (ref 13.0–17.7)
Immature Grans (Abs): 0 10*3/uL (ref 0.0–0.1)
Immature Granulocytes: 1 %
Lymphocytes Absolute: 1.7 10*3/uL (ref 0.7–3.1)
Lymphs: 33 %
MCH: 33.6 pg — ABNORMAL HIGH (ref 26.6–33.0)
MCHC: 36.3 g/dL — ABNORMAL HIGH (ref 31.5–35.7)
MCV: 93 fL (ref 79–97)
Monocytes Absolute: 0.8 10*3/uL (ref 0.1–0.9)
Monocytes: 15 %
Neutrophils Absolute: 2.3 10*3/uL (ref 1.4–7.0)
Neutrophils: 45 %
Platelets: 91 10*3/uL — CL (ref 150–450)
RBC: 4.38 x10E6/uL (ref 4.14–5.80)
RDW: 13 % (ref 11.6–15.4)
WBC: 5.1 10*3/uL (ref 3.4–10.8)

## 2021-10-10 ENCOUNTER — Emergency Department (HOSPITAL_COMMUNITY): Payer: BC Managed Care – PPO

## 2021-10-10 ENCOUNTER — Encounter (HOSPITAL_COMMUNITY): Payer: Self-pay | Admitting: Emergency Medicine

## 2021-10-10 ENCOUNTER — Other Ambulatory Visit: Payer: Self-pay

## 2021-10-10 ENCOUNTER — Emergency Department (HOSPITAL_COMMUNITY)
Admission: EM | Admit: 2021-10-10 | Discharge: 2021-10-10 | Disposition: A | Discharge status: Home / Self Care | Payer: BC Managed Care – PPO | Attending: Emergency Medicine | Admitting: Emergency Medicine

## 2021-10-10 DIAGNOSIS — R7989 Other specified abnormal findings of blood chemistry: Secondary | ICD-10-CM | POA: Insufficient documentation

## 2021-10-10 DIAGNOSIS — R251 Tremor, unspecified: Secondary | ICD-10-CM | POA: Insufficient documentation

## 2021-10-10 DIAGNOSIS — R9431 Abnormal electrocardiogram [ECG] [EKG]: Secondary | ICD-10-CM | POA: Diagnosis not present

## 2021-10-10 DIAGNOSIS — R531 Weakness: Secondary | ICD-10-CM | POA: Insufficient documentation

## 2021-10-10 DIAGNOSIS — Z7982 Long term (current) use of aspirin: Secondary | ICD-10-CM | POA: Diagnosis not present

## 2021-10-10 DIAGNOSIS — R079 Chest pain, unspecified: Secondary | ICD-10-CM | POA: Diagnosis not present

## 2021-10-10 LAB — CBC
HCT: 48.3 % (ref 39.0–52.0)
Hemoglobin: 16.3 g/dL (ref 13.0–17.0)
MCH: 32.7 pg (ref 26.0–34.0)
MCHC: 33.7 g/dL (ref 30.0–36.0)
MCV: 97 fL (ref 80.0–100.0)
Platelets: 158 10*3/uL (ref 150–400)
RBC: 4.98 MIL/uL (ref 4.22–5.81)
RDW: 13.5 % (ref 11.5–15.5)
WBC: 4.2 10*3/uL (ref 4.0–10.5)
nRBC: 0 % (ref 0.0–0.2)

## 2021-10-10 LAB — BASIC METABOLIC PANEL
Anion gap: 11 (ref 5–15)
BUN: <5 mg/dL — ABNORMAL LOW (ref 6–20)
CO2: 24 mmol/L (ref 22–32)
Calcium: 9.4 mg/dL (ref 8.9–10.3)
Chloride: 103 mmol/L (ref 98–111)
Creatinine, Ser: 0.91 mg/dL (ref 0.61–1.24)
GFR, Estimated: >60 mL/min (ref >60)
Glucose, Bld: 136 mg/dL — ABNORMAL HIGH (ref 70–99)
Potassium: 3.7 mmol/L (ref 3.5–5.1)
Sodium: 138 mmol/L (ref 135–145)

## 2021-10-10 LAB — URINALYSIS, ROUTINE W REFLEX MICROSCOPIC
Bilirubin Urine: NEGATIVE
Glucose, UA: NEGATIVE mg/dL
Hgb urine dipstick: NEGATIVE
Ketones, ur: NEGATIVE mg/dL
Leukocytes,Ua: NEGATIVE
Nitrite: NEGATIVE
Protein, ur: NEGATIVE mg/dL
Specific Gravity, Urine: 1.014 (ref 1.005–1.030)
pH: 6 (ref 5.0–8.0)

## 2021-10-10 LAB — TROPONIN I (HIGH SENSITIVITY)
Troponin I (High Sensitivity): 10 ng/L (ref <18)
Troponin I (High Sensitivity): 11 ng/L (ref <18)

## 2021-10-10 LAB — MAGNESIUM: Magnesium: 1.5 mg/dL — ABNORMAL LOW (ref 1.7–2.4)

## 2021-10-10 LAB — CBG MONITORING, ED: Glucose-Capillary: 135 mg/dL — ABNORMAL HIGH (ref 70–99)

## 2021-10-10 LAB — AMMONIA: Ammonia: 40 umol/L — ABNORMAL HIGH (ref 9–35)

## 2021-10-10 MED ORDER — CHLORDIAZEPOXIDE HCL 25 MG PO CAPS: ORAL_CAPSULE | ORAL | 0 refills | Status: DC

## 2021-10-10 MED ORDER — MAGNESIUM OXIDE 400 MG PO CAPS: 400.0000 mg | ORAL_CAPSULE | Freq: Every day | ORAL | 0 refills | Status: AC

## 2021-10-10 NOTE — Discharge Instructions (Addendum)
It was a pleasure taking care of you today!   Your labs were notable for decreased Magnesium, otherwise unremarkable. You will be sent a prescription for magnesium, take as prescribed.  You will be sent a prescription for Librium, take as prescribed.  I have placed ambulatory referral to neurology, they will call to set up a follow-up appointment regarding tremors.  Follow up with your primary care provider in 1 week for follow up on your magnesium level. Return to the ED if you are experiencing increasing/worsening tremors, chest pain, trouble breathing, or worsening symptoms.

## 2021-10-10 NOTE — ED Triage Notes (Signed)
Patient complains of intermittent weakness and tremor varying in intensity that started three months ago.  Patient states he has been seen for same complaints at PCP and urgent care for same with no diagnosis for cause of symptoms. Patient alert, oriented, and in no apparent distress at this time.

## 2021-10-10 NOTE — ED Provider Triage Note (Signed)
Emergency Medicine Provider Triage Evaluation Note  DAMARIE SCHOOLFIELD , a 61 y.o. male  was evaluated in triage.  Pt complains of tremors and generalized weakness.  Patient reports symptoms have been occurring intermittently over the past 3 months and he seen his PCP regarding this and was referred to urgent care.  Reports that recently he feels symptoms have been worse and he has been feeling generally weak and having a tremor primarily in both of his arms, and feeling off balance.  She denies focal weakness on one side.  No associated chest pain or shortness of breath.  No fevers or chills.  He does report that he typically drinks 2-3 beers daily, but has not abruptly stopped drinking recently.  Review of Systems  Positive: Tremors, weakness Negative: Vision changes, headache, chest pain, shortness of breath  Physical Exam  BP (!) 163/110 (BP Location: Left Arm)    Pulse 84    Temp 99.3 F (37.4 C) (Oral)    Resp 18    SpO2 98%  Gen:   Awake, no distress   Resp:  Normal effort  MSK:   Moves extremities without difficulty  Other:  Tremor noted in bilateral upper extremities worse when extending the arms, patient has normal strength in bilateral upper and lower extremities.  Medical Decision Making  Medically screening exam initiated at 9:16 AM.  Appropriate orders placed.  Kinnie Scales [place] was informed that the remainder of the evaluation will be completed by another provider, this initial triage assessment does not replace that evaluation, and the importance of remaining in the ED until their evaluation is complete.  Evaluation for tremors initiated   Dartha Lodge, [place] 10/10/21 1324

## 2021-10-10 NOTE — ED Notes (Signed)
Patient verbalizes understanding of discharge instructions. Opportunity for questioning and answers were provided. Armband removed by staff, pt discharged from ED ambulatory.   

## 2021-10-10 NOTE — ED Notes (Signed)
Phleb to get pt troponin

## 2021-10-10 NOTE — ED Provider Notes (Signed)
[facility] Provider Note   CSN: [id number] Arrival date & time: 10/10/21  [id number]     History  Chief Complaint  Patient presents with   Tremors    Barry Gutierrez is a 61 y.o. male who presents to the ED complaining tremors and generalized weakness.  He has associated sharp chest pain.  He notes that he notices chest pain most recently last night while at rest.  He did not take any medications for his symptoms.  He typically drinks between 2-3 beers daily with his last drink being last night.  Denies vision changes, fever, chills, nausea, vomiting.  Patient notes that he has been feeling generalized weakness and tremors since he had his surgery in February 2022.  Denies illicit drug use.  He has a history of Parkinson and his paternal great uncle.  He notes that he did let his primary care provider know of this.  Per patient chart review: Patient was evaluated for similar symptoms on 09/22/2021 in urgent care.  At that time he had a negative work-up, however elevated level of 0 below GEN noted.  Urgent care providers instructed patient to go to the ED for evaluation however patient did not go at that time.  Review of patient labs also notes that patient was not pleased that he did not have an exact cause of what his tremors were and felt as if he wasted his many, to see them.  Per Dr. Jenita Seashore the open quotes this is likely alcohol abuse related.  Will need a repeat CBC in 2-4 weeks.  Alcohol cessation is recommended."  The history is provided by the patient. No language interpreter was used.      Home Medications Prior to Admission medications   Medication Sig Start Date End Date Taking? Authorizing Provider  chlordiazePOXIDE (LIBRIUM) 25 MG capsule 50mg  PO TID x 1D, then 25-50mg  PO BID X 1D, then 25-50mg  PO QD X 1D 10/10/21  Yes Theus Espin A, PA-C  Magnesium Oxide 400 MG CAPS Take 1 capsule (400 mg total) by mouth daily for 15 days. 10/10/21 10/25/21 Yes  Eian Vandervelden A, PA-C  acetaminophen (TYLENOL) 325 MG tablet Take 325-650 mg by mouth every 6 (six) hours as needed for mild pain (for pain).    [provider]  aspirin EC 81 MG EC tablet Take 1 tablet (81 mg total) by mouth daily. 08/05/19   Furth, Cadence H, PA-C  atorvastatin (LIPITOR) 20 MG tablet Take 1 tablet (20 mg total) by mouth daily. 06/25/21   Elouise Munroe, MD  carvedilol (COREG) 12.5 MG tablet Take 1 tablet (12.5 mg total) by mouth 2 (two) times daily with a meal. 12/14/20   Elouise Munroe, MD  clopidogrel (PLAVIX) 75 MG tablet Take 1 tablet (75 mg total) by mouth daily. Please start 75 mg Daily on 03/30/21. 03/28/21   Elouise Munroe, MD  fenofibrate 160 MG tablet Take 1 tablet (160 mg total) by mouth daily. 05/21/21   Elouise Munroe, MD  icosapent Ethyl (VASCEPA) 1 g capsule Take 2 capsules (2 g total) by mouth 2 (two) times daily. 05/21/21   Elouise Munroe, MD  isosorbide mononitrate (IMDUR) 60 MG 24 hr tablet Take 1 tablet (60 mg total) by mouth daily. 06/18/21 09/16/21  Elouise Munroe, MD  nitroGLYCERIN (NITROSTAT) 0.4 MG SL tablet Place 1 tablet (0.4 mg total) under the tongue every 5 (five) minutes x 3 doses as needed for chest pain. 11/15/20  Isaiah Serge, NP      Allergies    Rosuvastatin    Review of Systems   Review of Systems  Constitutional:  Negative for chills and fever.  Eyes:  Negative for visual disturbance.  Gastrointestinal:  Negative for nausea and vomiting.  All other systems reviewed and are negative.  Physical Exam Updated Vital Signs BP (!) 172/98 (BP Location: Left Arm)    Pulse 66    Temp 98.5 F (36.9 C)    Resp 14    SpO2 98%  Physical Exam Vitals and nursing note reviewed.  Constitutional:      General: He is not in acute distress.    Appearance: He is not diaphoretic.  HENT:     Head: Normocephalic and atraumatic.     Mouth/Throat:     Pharynx: No oropharyngeal exudate.  Eyes:     General: No scleral  icterus.    Conjunctiva/sclera: Conjunctivae normal.  Cardiovascular:     Rate and Rhythm: Normal rate and regular rhythm.     Pulses: Normal pulses.     Heart sounds: Normal heart sounds.  Pulmonary:     Effort: Pulmonary effort is normal. No respiratory distress.     Breath sounds: Normal breath sounds. No wheezing.  Abdominal:     General: Bowel sounds are normal.     Palpations: Abdomen is soft. There is no mass.     Tenderness: There is no abdominal tenderness. There is no guarding or rebound.  Musculoskeletal:        General: Normal range of motion.     Cervical back: Normal range of motion and neck supple.     Comments: Strength and sensation intact to bilateral upper and lower extremities.  Skin:    General: Skin is warm and dry.  Neurological:     Mental Status: He is alert.     Motor: Tremor present.     Comments: Tremor noted in bilateral upper extremities noted at rest and with intentional movement.   Psychiatric:        Behavior: Behavior normal.    ED Results / Procedures / Treatments   Labs (all labs ordered are listed, but only abnormal results are displayed) Labs Reviewed  BASIC METABOLIC PANEL - Abnormal; Notable for the following components:      Result Value   Glucose, Bld 136 (*)    BUN <5 (*)    All other components within normal limits  MAGNESIUM - Abnormal; Notable for the following components:   Magnesium 1.5 (*)    All other components within normal limits  AMMONIA - Abnormal; Notable for the following components:   Ammonia 40 (*)    All other components within normal limits  CBG MONITORING, ED - Abnormal; Notable for the following components:   Glucose-Capillary 135 (*)    All other components within normal limits  CBC  URINALYSIS, ROUTINE W REFLEX MICROSCOPIC  TROPONIN I (HIGH SENSITIVITY)  TROPONIN I (HIGH SENSITIVITY)    EKG None  Radiology DG Chest 2 View  Result Date: 10/10/2021 CLINICAL DATA:  Intermittent weakness and pain.  EXAM: CHEST - 2 VIEW COMPARISON:  PA Lat 11/14/2020. FINDINGS: The heart size and mediastinal contours are within normal limits. There are calcifications in the aortic arch. Both lungs are hyperinflated but clear. The visualized skeletal structures are unremarkable apart from mild osteopenia. IMPRESSION: No active cardiopulmonary disease. Stable hyperinflated chest. Aortic atherosclerosis. Electronically Signed   By: Telford Nab M.D.   On:  10/10/2021 20:02    Procedures Procedures    Medications Ordered in ED Medications - No data to display  ED Course/ Medical Decision Making/ A&P Clinical Course as of 10/10/21 2358  Wed Oct 10, 2021  2021 Patient resting comfortably on stretcher playing solitaire on phone.  Notified that we are still waiting on some labs.  Patient with no questions at this time. [SB]  2117 Ammonia(!): 40 [SB]  2144 Case discussed with attending who agrees with librium and magnesium Rx [SB]  2253 This is a 61 year old male with a history of chronic alcohol use, daily alcohol use, presenting to ED with generalized tremulousness and weakness.  Patient reports that for the past 2 months or so, he has noticed trembling in both of his hands.  He has also had weakness in his legs and feels sometimes exhausted.  He reports that he is a daily alcohol drinker, feels that he has been scaling back on his drinking and now uses 2-3 beers a day.  He does have a family history of Parkinson's disease as well.  On exam the patient is mildly hypertensive.  He has visible resting and intention tremors in both arms.  No gross ataxia.  Some difficulty with finger-to-nose due to tremor.  He does have tongue fasciculation.  He is nondiaphoretic.  Blood test showed some mild hypomagnesemia with magnesium of 1.5, ammonia mildly elevated at 40, otherwise largely unremarkable.  Trope 11 and glucose within normal limits.  I doubt this is ACS or PE.  I doubt sepsis or infection.  I suspect this may be related  to alcohol withdrawal, I think it is reasonable to try him on a course of Librium.  If he feels improved without, working from referral to neurology for evaluation for possible neurocognitive disorder. [MT]    Clinical Course User Index [MT] Trifan, Carola Rhine, MD [SB] Jakeem Grape A, PA-C                           Medical Decision Making Amount and/or Complexity of Data Reviewed Labs: ordered. Decision-making details documented in ED Course. Radiology: ordered.  Risk OTC drugs. Prescription drug management.   Patient presents to the ED with 2-3 months of tremors and generalized weakness.  He has been evaluated by his primary care provider for his symptoms.  He has a history of Parkinson's disease in his paternal great uncle.  Vital signs, patient mildly hypertensive in the ED, afebrile, not tachycardic or hypoxic.  Patient consumes approximately 2-3 beers nightly with his last drink being last night.  On exam patient with tremor noted in bilateral upper extremities noted at rest and with intentional movement.  No acute cardiovascular, respiratory, abdominal exam findings.  Differential diagnosis includes sepsis, ACS, alcohol withdrawal, acute cystitis.  Labs:  I ordered, and personally interpreted labs.  The pertinent results include:  Initial troponin at 10.  Repeat troponin 11. Ammonia slightly elevated at 40. Magnesium slightly decreased at 1.5. Urinalysis unremarkable without signs of infection. CBC unremarkable without acute leukocytosis. BMP overall unremarkable.  Imaging: I ordered imaging studies including chest x-ray I independently visualized and interpreted imaging which showed:  No active cardiopulmonary disease. Stable hyperinflated chest.  Aortic atherosclerosis.   I agree with the radiologist interpretation  Disposition: Patient presentation suspicious for alcohol withdrawal.  Doubt ACS, sepsis, infection, acute cystitis as cause of symptoms.  After consideration  of the diagnostic results and the patients response to treatment, I feel  that the patient would benefit from discharge home with Librium and magnesium prescriptions.  We will also send ambulatory referral to neurology for follow-up regarding patient's tremor.  Case discussed with attending who also evaluated patient and agrees with discharge treatment plan.  Supportive care measures and strict return precautions discussed with patient at bedside. Pt acknowledges and verbalizes understanding. Pt appears safe for discharge. Follow up as indicated in discharge paperwork.   This chart was dictated using voice recognition software, Dragon. Despite the best efforts of this provider to proofread and correct errors, errors may still occur which can change documentation meaning.  Final Clinical Impression(s) / ED Diagnoses Final diagnoses:  Tremor    Rx / DC Orders ED Discharge Orders          Ordered    chlordiazePOXIDE (LIBRIUM) 25 MG capsule        10/10/21 2255    Magnesium Oxide 400 MG CAPS  Daily        10/10/21 2255    Ambulatory referral to Neurology       Comments: An appointment is requested in approximately: 2 weeks   10/10/21 2255              Cassie Henkels A, PA-C 10/10/21 2358    Wyvonnia Dusky, MD 10/11/21 1013

## 2021-11-16 ENCOUNTER — Ambulatory Visit: Payer: Self-pay | Admitting: *Deleted

## 2021-11-16 NOTE — Telephone Encounter (Signed)
?  Chief Complaint: left shoulder pain requesting appt  ?Symptoms: severe pain left shoulder unable use arm to dress self. Can not do normal activities. Salonpas med. Patches not helping with pain level. Rash noted did not state where on body  ?Frequency: started Wednesday 11/14/21 ?Pertinent Negatives: Patient denies chest pain , difficulty breathing. No numbness ,tingling in arm . ?Disposition: [x] ED /[] Urgent Care (no appt availability in office) / [] Appointment(In office/virtual)/ []  [facility]/ [] Home Care/ [] Refused Recommended Disposition /[]  Mobile Bus/ []  Follow-up with PCP ?Additional Notes:  ? ?Na  ? ? ? ? Reason for Disposition ? [1] SEVERE pain AND [2] not improved 2 hours after pain medicine ? ?Answer Assessment - Initial Assessment Questions ?1. ONSET: "When did the pain start?" ?    Wednesday 11/14/21 ?2. LOCATION: "Where is the pain located?" ?    Front and back of left shoulder  ?3. PAIN: "How bad is the pain?" (Scale 1-10; or mild, moderate, severe) ?  - MILD (1-3): doesn't interfere with normal activities ?  - MODERATE (4-7): interferes with normal activities (e.g., work or school) or awakens from sleep ?  - SEVERE (8-10): excruciating pain, unable to do any normal activities, unable to move arm at all due to pain ?    Severe unable do normal activities and dress. ?4. WORK OR EXERCISE: "Has there been any recent work or exercise that involved this part of the body?" ?    No  ?5. CAUSE: "What do you think is causing the shoulder pain?" ?    Na  ?6. OTHER SYMPTOMS: "Do you have any other symptoms?" (e.g., neck pain, swelling, rash, fever, numbness, weakness) ?    Left shoulder , rash chest and legs  ?7. PREGNANCY: "Is there any chance you are pregnant?" "When was your last menstrual period?" ?    na ? ?Protocols used: Shoulder Pain-A-AH ? ?

## 2021-11-17 ENCOUNTER — Emergency Department (HOSPITAL_BASED_OUTPATIENT_CLINIC_OR_DEPARTMENT_OTHER): Payer: BC Managed Care – PPO | Admitting: Radiology

## 2021-11-17 ENCOUNTER — Emergency Department (HOSPITAL_BASED_OUTPATIENT_CLINIC_OR_DEPARTMENT_OTHER)
Admission: EM | Admit: 2021-11-17 | Discharge: 2021-11-17 | Disposition: A | Discharge status: Home / Self Care | Payer: BC Managed Care – PPO | Attending: Emergency Medicine | Admitting: Emergency Medicine

## 2021-11-17 ENCOUNTER — Encounter (HOSPITAL_BASED_OUTPATIENT_CLINIC_OR_DEPARTMENT_OTHER): Payer: Self-pay | Admitting: Emergency Medicine

## 2021-11-17 ENCOUNTER — Other Ambulatory Visit: Payer: Self-pay

## 2021-11-17 DIAGNOSIS — Z7902 Long term (current) use of antithrombotics/antiplatelets: Secondary | ICD-10-CM | POA: Diagnosis not present

## 2021-11-17 DIAGNOSIS — Z7982 Long term (current) use of aspirin: Secondary | ICD-10-CM | POA: Insufficient documentation

## 2021-11-17 DIAGNOSIS — M25512 Pain in left shoulder: Secondary | ICD-10-CM

## 2021-11-17 DIAGNOSIS — R251 Tremor, unspecified: Secondary | ICD-10-CM | POA: Diagnosis not present

## 2021-11-17 DIAGNOSIS — M7532 Calcific tendinitis of left shoulder: Secondary | ICD-10-CM | POA: Insufficient documentation

## 2021-11-17 DIAGNOSIS — M779 Enthesopathy, unspecified: Secondary | ICD-10-CM

## 2021-11-17 MED ORDER — KETOROLAC TROMETHAMINE 30 MG/ML IJ SOLN
30.0000 mg | Freq: Once | INTRAMUSCULAR | Status: AC
  Administered 2021-11-17: 30 mg via INTRAMUSCULAR
  Filled 2021-11-17: qty 1

## 2021-11-17 MED ORDER — LIDOCAINE 5 % EX PTCH
1.0000 | MEDICATED_PATCH | CUTANEOUS | Status: DC
  Administered 2021-11-17: 1 via TRANSDERMAL
  Filled 2021-11-17: qty 1

## 2021-11-17 MED ORDER — IBUPROFEN 800 MG PO TABS: 800.0000 mg | ORAL_TABLET | Freq: Once | ORAL | Status: DC

## 2021-11-17 MED ORDER — IBUPROFEN 800 MG PO TABS: 800.0000 mg | ORAL_TABLET | Freq: Three times a day (TID) | ORAL | 0 refills | Status: AC

## 2021-11-17 MED ORDER — HYDROCODONE-ACETAMINOPHEN 5-325 MG PO TABS
1.0000 | ORAL_TABLET | Freq: Once | ORAL | Status: DC
  Filled 2021-11-17: qty 1

## 2021-11-17 MED ORDER — LIDOCAINE 5 % EX PTCH: 1.0000 | MEDICATED_PATCH | Freq: Every day | CUTANEOUS | 0 refills | Status: DC | PRN

## 2021-11-17 MED ORDER — METHOCARBAMOL 500 MG PO TABS: 1000.0000 mg | ORAL_TABLET | Freq: Two times a day (BID) | ORAL | 0 refills | Status: AC

## 2021-11-17 NOTE — ED Notes (Signed)
Dc instructions reviewed with patient. Patient voiced understanding. Dc with belongings. Cautioned patient against using robaxin with alcohol.  ?

## 2021-11-17 NOTE — Discharge Instructions (Addendum)
It was a pleasure caring for you today in the emergency department. ? ?Please continue using your L arm sling ? ?Please return to the emergency department for any worsening or worrisome symptoms. ? ?

## 2021-11-17 NOTE — ED Provider Notes (Signed)
[facility] EMERGENCY DEPT Provider Note   CSN: [id number] Arrival date & time: 11/17/21  6295     History  Chief Complaint  Patient presents with   Shoulder Pain    Barry Gutierrez is a 61 y.o. male.  This is a 61 y.o. male with significant medical history as below, including CAD, alcohol abuse who presents to the ED with complaint of left shoulder pain  Location: Left shoulder, anterior Duration: 3 days Onset: Sudden Timing: Constant Description: Sharp, stabbing Severity: Mild Exacerbating/Alleviating Factors: Worse with movement, raising the arm, attempting to pick up objects Associated Symptoms: Bicep discomfort, reduced range of motion of left upper extremity Pertinent Negatives: No fevers, chills, nausea, vomiting, chest pain, no neck or back pain.  No numbness or tingling Context: Patient woke up 3 days ago with pain to his left shoulder, unrelieved with ASA    Past Medical History: No date: Allergy No date: MI (myocardial infarction) (HCC) 11/15/2020: S/P angioplasty with stent DES to RCA 11/15/20  Past Surgical History: 11/15/2020: CORONARY STENT INTERVENTION; N/A     Comment:  Procedure: CORONARY STENT INTERVENTION;  Surgeon:               Corky Crafts, MD;  Location: [facility] INVASIVE CV LAB;               Service: Cardiovascular;  Laterality: N/A; 08/04/2019: LEFT HEART CATH AND CORONARY ANGIOGRAPHY; N/A     Comment:  Procedure: LEFT HEART CATH AND CORONARY ANGIOGRAPHY;                Surgeon: Iran Ouch, MD;  Location: [facility] INVASIVE CV              LAB;  Service: Cardiovascular;  Laterality: N/A; 11/15/2020: LEFT HEART CATH AND CORONARY ANGIOGRAPHY; N/A     Comment:  Procedure: LEFT HEART CATH AND CORONARY ANGIOGRAPHY;                Surgeon: Corky Crafts, MD;  Location: [facility] INVASIVE              CV LAB;  Service: Cardiovascular;  Laterality: N/A;    The history is provided by the patient. No language interpreter was used.       Home Medications Prior to Admission medications   Medication Sig Start Date End Date Taking? Authorizing Provider  ibuprofen (ADVIL) 800 MG tablet Take 1 tablet (800 mg total) by mouth 3 (three) times daily for 5 days. Take with food 11/17/21 11/22/21 Yes Tanda Rockers A, DO  lidocaine (LIDODERM) 5 % Place 1 patch onto the skin daily as needed. Remove & Discard patch within 12 hours or as directed by MD 11/17/21  Yes Sloan Leiter, DO  methocarbamol (ROBAXIN) 500 MG tablet Take 2 tablets (1,000 mg total) by mouth 2 (two) times daily for 5 days. 11/17/21 11/22/21 Yes Sloan Leiter, DO  acetaminophen (TYLENOL) 325 MG tablet Take 325-650 mg by mouth every 6 (six) hours as needed for mild pain (for pain).    [provider]  aspirin EC 81 MG EC tablet Take 1 tablet (81 mg total) by mouth daily. 08/05/19   Furth, Cadence H, PA-C  atorvastatin (LIPITOR) 20 MG tablet Take 1 tablet (20 mg total) by mouth daily. 06/25/21   Parke Poisson, MD  carvedilol (COREG) 12.5 MG tablet Take 1 tablet (12.5 mg total) by mouth 2 (two) times daily with a meal. 12/14/20   Parke Poisson, MD  chlordiazePOXIDE (LIBRIUM) 25 MG capsule 50mg  PO TID x 1D, then 25-50mg  PO BID X 1D, then 25-50mg  PO QD X 1D 10/10/21   Blue, Soijett A, PA-C  clopidogrel (PLAVIX) 75 MG tablet Take 1 tablet (75 mg total) by mouth daily. Please start 75 mg Daily on 03/30/21. 03/28/21   Parke Poisson, MD  fenofibrate 160 MG tablet Take 1 tablet (160 mg total) by mouth daily. 05/21/21   Parke Poisson, MD  icosapent Ethyl (VASCEPA) 1 g capsule Take 2 capsules (2 g total) by mouth 2 (two) times daily. 05/21/21   Parke Poisson, MD  isosorbide mononitrate (IMDUR) 60 MG 24 hr tablet Take 1 tablet (60 mg total) by mouth daily. 06/18/21 09/16/21  Parke Poisson, MD  nitroGLYCERIN (NITROSTAT) 0.4 MG SL tablet Place 1 tablet (0.4 mg total) under the tongue every 5 (five) minutes x 3 doses as needed for chest pain. 11/15/20   Leone Brand, NP      Allergies    Rosuvastatin    Review of Systems   Review of Systems  Musculoskeletal:  Positive for arthralgias.  All other systems reviewed and are negative.  Physical Exam Updated Vital Signs BP (!) 141/98   Pulse 79   Temp 98.9 F (37.2 C)   Resp 20   Wt 88.5 kg   SpO2 95%   BMI 27.98 kg/m  Physical Exam Vitals and nursing note reviewed.  Constitutional:      General: He is not in acute distress.    Appearance: Normal appearance. He is well-developed.  HENT:     Head: Normocephalic and atraumatic.     Right Ear: External ear normal.     Left Ear: External ear normal.     Mouth/Throat:     Mouth: Mucous membranes are moist.  Eyes:     General: No scleral icterus. Cardiovascular:     Rate and Rhythm: Normal rate and regular rhythm.     Pulses: Normal pulses.          Radial pulses are 2+ on the right side and 2+ on the left side.     Heart sounds: Normal heart sounds.  Pulmonary:     Effort: Pulmonary effort is normal. No respiratory distress.     Breath sounds: Normal breath sounds.  Abdominal:     General: Abdomen is flat.     Palpations: Abdomen is soft.     Tenderness: There is no abdominal tenderness.  Musculoskeletal:        General: Normal range of motion.     Cervical back: Normal range of motion.     Right lower leg: No edema.     Left lower leg: No edema.     Comments: Pain to bicep tendon groove on left shoulder.  Upper extremities are neurovascular intact to radial, ulnar median nerve distributions.    No external evidence of trauma\  No midline spinous process tenderness to palpation or percussion, no crepitus or step-off.     Skin:    General: Skin is warm and dry.     Capillary Refill: Capillary refill takes less than 2 seconds.  Neurological:     Mental Status: He is alert and oriented to person, place, and time.  Psychiatric:        Mood and Affect: Mood normal.        Behavior: Behavior normal.    ED Results /  Procedures / Treatments   Labs (all labs ordered are  listed, but only abnormal results are displayed) Labs Reviewed - No data to display  EKG None  Radiology DG Shoulder Left  Result Date: 11/17/2021 CLINICAL DATA:  Left shoulder pain for 3 days. EXAM: LEFT SHOULDER - 2+ VIEW COMPARISON:  None. FINDINGS: There is moderate calcification measuring approximately 1.9 x 0.6 cm (transverse by craniocaudal) superior to the lateral aspect of the humeral head. This is seen slightly anteriorly on transscapular Y-view, in the region of the supraspinatus tendon. There is also a punctate calcification just superior to the far lateral humeral head. Mild inferior glenohumeral joint space narrowing. Moderate acromioclavicular joint space narrowing and peripheral osteophytosis. No acute fracture or dislocation. Moderate calcifications within the aortic arch. 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 Electronically Signed   By: Neita Garnet M.D.   On: 11/17/2021 09:14    Procedures Procedures    Medications Ordered in ED Medications  HYDROcodone-acetaminophen (NORCO/VICODIN) 5-325 MG per tablet 1 tablet (1 tablet Oral Not Given 11/17/21 0918)  lidocaine (LIDODERM) 5 % 1 patch (1 patch Transdermal Patch Applied 11/17/21 0914)  ketorolac (TORADOL) 30 MG/ML injection 30 mg (30 mg Intramuscular Given 11/17/21 0981)    ED Course/ Medical Decision Making/ A&P                           Medical Decision Making Amount and/or Complexity of Data Reviewed Radiology: ordered.  Risk Prescription drug management.   Initial Impression and Ddx This patient presents to the Emergency Department for the above complaint. This involves an extensive number of treatment options and is a complaint that carries with it a high risk of complications and morbidity. Vital signs were reviewed.   Serious etiologies considered. Ddx includes but is not limited to: MSK, fracture, dislocation,  tendinitis  Patient PMH that increases complexity of ED encounter: Alcohol abuse  Previous records obtained and reviewed   Additional history obtained from N/AA   Interpretation of Diagnostics  imaging results that were available during my care of the patient were visualized by me and considered in my medical decision making.   I ordered imaging studies which included left shoulder x-ray and I visualized the imaging and I agree with radiologist interpretation.  Tendinitis  Social determinants of health include -alcohol, tobacco use  Personally discussed patient care with consultant; N/A    Patient Reassessment and Ultimate Disposition/Management  Patient management required discussion with the following services or consulting groups:  None  Patient given analgesics, he already has a left shoulder sling.    Patient feeling better after intervention. ROM improved.   Physical exam is reassuring, x-ray concerning for tendinitis.  Physical exam is also concerning for tendinitis.  I do not appreciate any edema to his left upper extremity, pallor, rashes. No palpable cords.  Neuro vascularly intact to radial, ulnar, median nerve distributions.  Discussed supportive care including anti-inflammatory medications, reduction of activity, no heavy lifting.  Follow-up sports med     The patient improved significantly and was discharged in stable condition. Detailed discussions were had with the patient regarding current findings, and need for close f/u with PCP or on call doctor. The patient has been instructed to return immediately if the symptoms worsen in any way for re-evaluation. Patient verbalized understanding and is in agreement with current care plan. All questions answered prior to discharge.  Counseled patient for approximately 3 minutes regarding smoking cessation. Discussed risks of smoking and how they applied and  affected their visit here today. Patient not ready to quit at this time, however will follow up with their primary doctor when they are.   CPT code: 16109: intermediate counseling for smoking cessation    Complexity of Problems Addressed Acute illness or injury that poses threat of life of bodily function  Additional Data Reviewed and Analyzed Further history obtained from: Past medical history and medications listed in the EMR, Prior ED visit notes, Recent PCP notes, and Prior labs/imaging results  Patient Encounter Risk Assessment Consideration of hospitalization      This chart was dictated using voice recognition software.  Despite best efforts to proofread,  errors can occur which can change the documentation meaning.         Final Clinical Impression(s) / ED Diagnoses Final diagnoses:  Tendinitis  Acute pain of left shoulder    Rx / DC Orders ED Discharge Orders          Ordered    lidocaine (LIDODERM) 5 %  Daily PRN        11/17/21 1004    ibuprofen (ADVIL) 800 MG tablet  3 times daily        11/17/21 1004    methocarbamol (ROBAXIN) 500 MG tablet  2 times daily        11/17/21 1004              Tanda Rockers A, DO 11/17/21 1005

## 2021-11-17 NOTE — ED Triage Notes (Signed)
Pt states he has tried antiinflammatory and salon pas with no relief.  ?

## 2021-11-17 NOTE — ED Triage Notes (Signed)
Left shoulder pain after awakening Wednesday . Pt states has worsened and cant stand it. Sharp pain. Left hand swollen 1+ edema. No numbness. ?

## 2021-12-25 ENCOUNTER — Other Ambulatory Visit: Payer: Self-pay | Admitting: Internal Medicine

## 2022-01-01 ENCOUNTER — Ambulatory Visit (INDEPENDENT_AMBULATORY_CARE_PROVIDER_SITE_OTHER): Payer: BC Managed Care – PPO | Admitting: Physician Assistant

## 2022-01-01 ENCOUNTER — Encounter: Payer: Self-pay | Admitting: Physician Assistant

## 2022-01-01 VITALS — BP 141/85 | HR 77 | Temp 98.8°F | Resp 18 | Ht 70.0 in | Wt 196.0 lb

## 2022-01-01 DIAGNOSIS — R251 Tremor, unspecified: Secondary | ICD-10-CM | POA: Diagnosis not present

## 2022-01-01 DIAGNOSIS — Z95820 Peripheral vascular angioplasty status with implants and grafts: Secondary | ICD-10-CM

## 2022-01-01 DIAGNOSIS — I2510 Atherosclerotic heart disease of native coronary artery without angina pectoris: Secondary | ICD-10-CM

## 2022-01-01 DIAGNOSIS — F10931 Alcohol use, unspecified with withdrawal delirium: Secondary | ICD-10-CM

## 2022-01-01 DIAGNOSIS — G621 Alcoholic polyneuropathy: Secondary | ICD-10-CM

## 2022-01-01 DIAGNOSIS — E782 Mixed hyperlipidemia: Secondary | ICD-10-CM

## 2022-01-01 DIAGNOSIS — F10939 Alcohol use, unspecified with withdrawal, unspecified: Secondary | ICD-10-CM

## 2022-01-01 DIAGNOSIS — E559 Vitamin D deficiency, unspecified: Secondary | ICD-10-CM

## 2022-01-01 DIAGNOSIS — Z125 Encounter for screening for malignant neoplasm of prostate: Secondary | ICD-10-CM

## 2022-01-01 DIAGNOSIS — F1010 Alcohol abuse, uncomplicated: Secondary | ICD-10-CM

## 2022-01-01 DIAGNOSIS — I10 Essential (primary) hypertension: Secondary | ICD-10-CM

## 2022-01-01 DIAGNOSIS — Z6828 Body mass index (BMI) 28.0-28.9, adult: Secondary | ICD-10-CM

## 2022-01-01 MED ORDER — CHLORDIAZEPOXIDE HCL 25 MG PO CAPS: ORAL_CAPSULE | ORAL | 0 refills | Status: DC

## 2022-01-01 MED ORDER — THIAMINE HCL 100 MG PO TABS
100.0000 mg | ORAL_TABLET | Freq: Every day | ORAL | 1 refills | Status: DC
Start: 2022-01-01 — End: 2022-02-12

## 2022-01-01 NOTE — Patient Instructions (Addendum)
I have found the following information for you and I think it is very important to consider (although you are not experiencing the burning sensations yet) ? 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 The clinical abnormalities are usually symmetric and predominantly distal. Symptoms include numbness, paresthesia, burning dysesthesia, pain, weakness, muscle cramps, and gait ataxia. The most common neurologic signs are loss of tendon reflexes, beginning with the ankle jerks, defective perception of touch and vibration sensation, and weakness. Loss of vibratory sensation can be demonstrated in asymptomatic individuals with alcohol use disorder [68]. ? ? ?Alcoholic polyneuropathy also renders patients susceptible to compression of peripheral nerves at common sites of entrapment, including the median nerve at the carpal tunnel, the ulnar nerve at the elbow, and the peroneal nerve at the fibular head  "Saturday night palsies" occur during bouts of intoxication when the radial nerve is compressed against the spiral groove of the humerus.  ? ? ?Treatment -- Specific treatments for alcoholic peripheral neuropathy are not available. Patients should receive thiamine supplementation since malnutrition may contribute to the development of the disorder. Improved nutrition and cessation of drinking have been associated with symptom improvement in cohort studies although complete recovery from severe neuropathy is uncommon. Low doses of tricyclic antidepressants, mexiletine], or gabapentin are sometimes effective in controlling the burning dysesthesias of alcoholic peripheral neuropathy. ? ? ?Alcohol Withdrawal Syndrome ?Alcohol withdrawal syndrome is a group of symptoms that can develop when a person who drinks heavily and regularly stops drinking or drinks less. Alcohol withdrawal syndrome can be mild or severe, and it may even be  life-threatening. ?Alcohol withdrawal syndrome usually affects people who have alcohol use disorder, which may also be called alcoholism. Alcohol use disorder is when a person is unable to control his or her alcohol use, and drinking too much or too often causes problems at home, at work, or in relationships. ?What are the causes? ?Drinking heavily and drinking on a regular basis cause changes in brain chemistry. Over time, the body becomes dependent on alcohol. When alcohol use stops, the chemistry system in the brain becomes unbalanced and causes the symptoms of alcohol withdrawal. ?What increases the risk? ?Alcohol withdrawal syndrome is more likely to occur in people who drink more than the recommended limit of alcohol (2 drinks a day for men or 1 drink a day for non-pregnant women). It is also more likely to affect heavy drinkers who have been using alcohol for long periods of time. The more a person drinks and the longer he or she drinks, the greater the risk of alcohol withdrawal syndrome. ?Severe withdrawal is more likely to develop in someone who: ?Had severe alcohol withdrawal in the past. ?Had a seizure during a previous episode of alcohol withdrawal. ?Is elderly. ?Uses other drugs. ?Has a long-term (chronic) medical problem, such as heart, lung, or liver disease. ?Has depression. ?Does not get enough nutrients from his or her diet (malnutrition). ?What are the signs or symptoms? ?Symptoms of this condition can be mild to moderate, or they can be severe. Symptoms may develop a few hours (or up to a day) after a person changes his or her drinking patterns. During the 48 hours after he or she has stopped drinking, the following symptoms may go away or get better: ?Uncontrollable shaking (tremor). ?Sweating. ?Headache. ?Anxiety. ?Inability to relax (agitation). ?Trouble sleeping (insomnia). ?Irregular heartbeats (palpitations). ?Alcohol cravings. ?Seizure. ?The following symptoms may get worse 24-48 hours  after a person  has decreased or stopped alcohol use, and they may gradually improve over a period of days or weeks: ?Nausea and vomiting. ?Fatigue. ?Sensitivity to light and sounds. ?Confusion and inability to think clearly. ?Loss of appetite. ?Mood swings, irritability, depression, and anxiety. ?Insomnia and nightmares. ?The following symptoms are severe and life-threatening. When these symptoms occur together, they are called delirium tremens (DTs): ?High blood pressure. ?Increased heart rate. ?Trouble breathing. ?Seizures. These may go away along with other symptoms, or they may persist. ?Seeing, hearing, feeling, smelling, or tasting things that are not there (hallucinations). If you experience hallucinations, they usually begin 12-24 hours after a change in drinking patterns. ?Delirium tremens requires immediate hospitalization. ?How is this diagnosed? ?This condition may be diagnosed based on: ?Your symptoms and medical history. ?Your history of alcohol use. Your health care provider may ask questions about your drinking behavior. It is important to be honest when you answer these questions. ?A psychological assessment. ?A physical exam. ?Blood tests or urine tests to measure blood alcohol level and to rule out other causes of symptoms. ?MRI or CT scan. This may be done if you seem to have abnormal thinking or behaviors (altered mental status). ?Diagnosis can be difficult. People going through withdrawal often avoid seeking medical care and are not thinking clearly. Friends and family members play an important role in recognizing symptoms and encouraging loved ones to get treatment. ?How is this treated? ?Most people with symptoms of withdrawal can be treated outside of a hospital setting (outpatient treatment), with close monitoring such as daily check-ins with a health care provider and counseling. You may need treatment at a hospital or treatment center (inpatient treatment) if: ?You have a history of  delirium tremens or seizures. ?You have severe symptoms. ?You are addicted to other drugs. ?You cannot swallow medicine. ?You have a serious medical condition such as heart failure. ?You experienced withdrawal in the past but then you continued drinking alcohol. ?You are not likely to commit to an outpatient treatment schedule. ?Treatment may involve: ?Monitoring your blood pressure, pulse, and breathing. ?IV fluids to keep you hydrated. ?Medicines to reduce withdrawal symptoms and discomfort (benzodiazepines). ?Medicine to reduce anxiety. ?Medicine to prevent or control seizures. ?Multivitamins and B vitamins. ?Having a health care provider check on you daily. ?It is important to get treatment for alcohol withdrawal early. Getting treatment early can: ?Speed up your recovery from withdrawal symptoms. ?Make you more successful with long-term stoppage of alcohol use (sobriety). ?If you need help to stop drinking, your health care provider may recommend a long-term treatment plan that includes: ?Medicines to help treat alcohol use disorder. ?Substance abuse counseling. ?Support groups. ?Follow these instructions at home: ? ?Take over-the-counter and prescription medicines (including vitamin supplements) only as told by your health care provider. ?Do not drink alcohol. ?Do not drive until your health care provider approves. ?Have someone you trust stay with you or be available if you need help with your symptoms or with not drinking. ?Drink enough fluid to keep your urine pale yellow. ?Consider joining an alcohol support group or treatment program. These can provide emotional support, advice, and guidance. ?Keep all follow-up visits as told by your health care provider. This is important. ?Contact a health care provider if: ?Your symptoms get worse instead of better. ?You cannot eat or drink without vomiting. ?You are struggling with not drinking alcohol. ?You cannot stop drinking alcohol. ?Get help right away if: ?You  have an irregular heartbeat. ?You have chest pain. ?You have trouble breathing. ?  You have a seizure for the first time. ?You hallucinate. ?You become very confused. ?Summary ?Alcohol withdrawal is a group of symptoms that can

## 2022-01-01 NOTE — Progress Notes (Signed)
? ?Established Patient Office Visit ? ?Subjective   ?Patient ID: Barry Gutierrez, male    DOB: 05-23-1961  Age: 61 y.o. MRN: [id number] ? ?Chief Complaint  ?Patient presents with  ? Referral  ? ? ?States that he was seen in the emergency department in February 2023, note from that visit: ?  ?Barry Gutierrez is a 61 y.o. male who presents to the ED complaining tremors and generalized weakness.  He has associated sharp chest pain.  He notes that he notices chest pain most recently last night while at rest.  He did not take any medications for his symptoms.  He typically drinks between 2-3 beers daily with his last drink being last night.  Denies vision changes, fever, chills, nausea, vomiting.  Patient notes that he has been feeling generalized weakness and tremors since he had his surgery in February 2022.  Denies illicit drug use.  He has a history of Parkinson and his paternal great uncle.  He notes that he did let his primary care provider know of this. ?  ?Per patient chart review: Patient was evaluated for similar symptoms on 09/22/2021 in urgent care.  At that time he had a negative work-up, however elevated level of 0 below GEN noted.  Urgent care providers instructed patient to go to the ED for evaluation however patient did not go at that time.  Review of patient labs also notes that patient was not pleased that he did not have an exact cause of what his tremors were and felt as if he wasted his many, to see them.  Per Dr. Hurley Cisco the open quotes this is likely alcohol abuse related.  Will need a repeat CBC in 2-4 weeks.  Alcohol cessation is recommended." ? ?Patient presents to the ED with 2-3 months of tremors and generalized weakness.  He has been evaluated by his primary care provider for his symptoms.  He has a history of Parkinson's disease in his paternal great uncle.  Vital signs, patient mildly hypertensive in the ED, afebrile, not tachycardic or hypoxic.  Patient consumes approximately 2-3 beers  nightly with his last drink being last night.  On exam patient with tremor noted in bilateral upper extremities noted at rest and with intentional movement.  No acute cardiovascular, respiratory, abdominal exam findings.  Differential diagnosis includes sepsis, ACS, alcohol withdrawal, acute cystitis. ?  ?Labs:  ?I ordered, and personally interpreted labs.  The pertinent results include:  ?Initial troponin at 10.  Repeat troponin 11. ?Ammonia slightly elevated at 40. ?Magnesium slightly decreased at 1.5. ?Urinalysis unremarkable without signs of infection. ?CBC unremarkable without acute leukocytosis. ?BMP overall unremarkable. ?  ?Imaging: ?I ordered imaging studies including chest x-ray ?I independently visualized and interpreted imaging which showed:  ?No active cardiopulmonary disease. Stable hyperinflated chest. ?Aortic atherosclerosis. ?  ?I agree with the radiologist interpretation ?  ?Disposition: ?Patient presentation suspicious for alcohol withdrawal.  Doubt ACS, sepsis, infection, acute cystitis as cause of symptoms.  After consideration of the diagnostic results and the patients response to treatment, I feel that the patient would benefit from discharge home with Librium and magnesium prescriptions.  We will also send ambulatory referral to neurology for follow-up regarding patient's tremor.  Case discussed with attending who also evaluated patient and agrees with discharge treatment plan.  Supportive care measures and strict return precautions discussed with patient at bedside. Pt acknowledges and verbalizes understanding. Pt appears safe for discharge. Follow up as indicated in discharge paperwork.  ?   ? ?  States today that he normally does not believe that the tremors are from his alcohol use.  Does endorse that he drinks 3-4 alcoholic drinks on a daily basis.  But does not feel he is an alcoholic and feels he can stop whenever he wants to. ? ?States his last episode of tremors was Wednesday of last  week, states that he feels very weak in his legs when this happens and this makes it very difficult for him to work. ? ?States that he believes his tremors are due to something else, states that he has a great uncle who had Parkinson's disease and wants this issue to be researched further for him ? ?States that he has not been able to be seen by neurology, neurology's response to referral request ? ?Neurology response to referral  ? ?Note:   ?From: Tat, Rebecca S, DO ?Sent: 12/16/2021   3:40 PM EDT ?To: Jonetta OsgoodDanna W Cordero ?  ?We require all patients be seen and evaluated by primary care as many times, they can help and trial meds before the patients get to us.  This patient has not seen pcp since last year ? ?States that he has stopped taking his blood thinner and cholesterol medications, states that he was skeptical and thought maybe these medications were causing his issues. ? ?States that he is continuing to take the carvedilol, Vascepa and Imdur.   ? ?States that he did take the course of Librium in February after the emergency department visit but did resume alcohol intake afterwards. ? ? ? ?Past Medical History:  ?Diagnosis Date  ? Allergy   ? MI (myocardial infarction) (HCC)   ? S/P angioplasty with stent DES to RCA 11/15/20 11/15/2020  ? ?Social History  ? ?Socioeconomic History  ? Marital status: Legally Separated  ?  Spouse name: Not on file  ? Number of children: Not on file  ? Years of education: Not on file  ? Highest education level: Not on file  ?Occupational History  ? Not on file  ?Tobacco Use  ? Smoking status: Former  ?  Packs/day: 2.00  ?  Types: Cigarettes  ?  Quit date: 09/2021  ?  Years since quitting: 0.3  ? Smokeless tobacco: Never  ?Substance and Sexual Activity  ? Alcohol use: Yes  ? Drug use: Yes  ?  Types: Marijuana  ?  Comment: every other month or so   ? Sexual activity: Not Currently  ?Other Topics Concern  ? Not on file  ?Social History Narrative  ? Not on file  ? ?Social Determinants of  Health  ? ?Financial Resource Strain: Not on file  ?Food Insecurity: Not on file  ?Transportation Needs: Not on file  ?Physical Activity: Not on file  ?Stress: Not on file  ?Social Connections: Not on file  ?Intimate Partner Violence: Not on file  ? ?Family History  ?Problem Relation Age of Onset  ? Cancer Mother   ? CAD Mother   ? Coronary artery disease Mother   ? Diabetes Father   ? CAD Father   ? Coronary artery disease Father   ? ?Allergies  ?Allergen Reactions  ? Rosuvastatin   ?  Hungry and nauseous  ? ?  ? ?Review of Systems  ?Constitutional: Negative.   ?HENT: Negative.    ?Eyes: Negative.   ?Respiratory:  Negative for shortness of breath.   ?Cardiovascular:  Negative for chest pain.  ?Gastrointestinal: Negative.   ?Genitourinary: Negative.   ?Musculoskeletal: Negative.   ?Skin: Negative.   ?  Neurological:  Positive for tremors and weakness. Negative for dizziness and headaches.  ?Endo/Heme/Allergies: Negative.   ?Psychiatric/Behavioral: Negative.    ? ?  ?Objective:  ?  ? ?BP (!) 141/85 (BP Location: Right Arm, Patient Position: Sitting, Cuff Size: Normal)   Pulse 77   Temp 98.8 ?F (37.1 ?C) (Oral)   Resp 18   Ht 5\' 10"  (1.778 m)   Wt 196 lb (88.9 kg)   SpO2 95%   BMI 28.12 kg/m?  ?BP Readings from Last 3 Encounters:  ?01/01/22 (!) 141/85  ?11/17/21 (!) 154/93  ?10/10/21 (!) 181/106  ? ?Wt Readings from Last 3 Encounters:  ?01/01/22 196 lb (88.9 kg)  ?11/17/21 195 lb (88.5 kg)  ?08/01/21 193 lb 9.6 oz (87.8 kg)  ? ?  ? ?Physical Exam ?Vitals and nursing note reviewed.  ?Constitutional:   ?   General: He is not in acute distress. ?   Appearance: Normal appearance. He is not ill-appearing.  ?   Comments: Unable to complete physical exam due to loss of power in clinic  ?HENT:  ?   Head: Normocephalic and atraumatic.  ?   Right Ear: External ear normal.  ?   Left Ear: External ear normal.  ?   Nose: Nose normal.  ?   Mouth/Throat:  ?   Mouth: Mucous membranes are moist.  ?   Pharynx: Oropharynx is clear.   ?Eyes:  ?   Conjunctiva/sclera: Conjunctivae normal.  ?   Pupils: Pupils are equal, round, and reactive to light.  ?Musculoskeletal:     ?   General: Normal range of motion.  ?Neurological:  ?   General: No focal

## 2022-01-02 ENCOUNTER — Other Ambulatory Visit: Payer: BC Managed Care – PPO

## 2022-01-02 ENCOUNTER — Encounter: Payer: Self-pay | Admitting: Physician Assistant

## 2022-01-02 DIAGNOSIS — E782 Mixed hyperlipidemia: Secondary | ICD-10-CM | POA: Diagnosis not present

## 2022-01-02 DIAGNOSIS — R251 Tremor, unspecified: Secondary | ICD-10-CM | POA: Diagnosis not present

## 2022-01-02 DIAGNOSIS — Z125 Encounter for screening for malignant neoplasm of prostate: Secondary | ICD-10-CM

## 2022-01-03 LAB — HEPATIC FUNCTION PANEL
ALT: 25 IU/L (ref 0–44)
AST: 39 IU/L (ref 0–40)
Albumin: 4.5 g/dL (ref 3.8–4.9)
Alkaline Phosphatase: 48 IU/L (ref 44–121)
Bilirubin Total: 0.7 mg/dL (ref 0.0–1.2)
Bilirubin, Direct: 0.29 mg/dL (ref 0.00–0.40)
Total Protein: 6.9 g/dL (ref 6.0–8.5)

## 2022-01-03 LAB — LIPID PANEL
Chol/HDL Ratio: 2.4 ratio (ref 0.0–5.0)
Cholesterol, Total: 203 mg/dL — ABNORMAL HIGH (ref 100–199)
HDL: 85 mg/dL (ref >39)
LDL Chol Calc (NIH): 104 mg/dL — ABNORMAL HIGH (ref 0–99)
Triglycerides: 78 mg/dL (ref 0–149)
VLDL Cholesterol Cal: 14 mg/dL (ref 5–40)

## 2022-01-03 LAB — TSH: TSH: 2.99 u[IU]/mL (ref 0.450–4.500)

## 2022-01-03 LAB — PSA: Prostate Specific Ag, Serum: 1.2 ng/mL (ref 0.0–4.0)

## 2022-01-03 LAB — VITAMIN B12: Vitamin B-12: 520 pg/mL (ref 232–1245)

## 2022-01-03 LAB — VITAMIN D 25 HYDROXY (VIT D DEFICIENCY, FRACTURES): Vit D, 25-Hydroxy: 10.2 ng/mL — ABNORMAL LOW (ref 30.0–100.0)

## 2022-01-07 ENCOUNTER — Other Ambulatory Visit: Payer: Self-pay

## 2022-01-07 MED ORDER — CARVEDILOL 12.5 MG PO TABS: 12.5000 mg | ORAL_TABLET | Freq: Two times a day (BID) | ORAL | 1 refills | Status: DC

## 2022-01-07 MED ORDER — VITAMIN D (ERGOCALCIFEROL) 1.25 MG (50000 UNIT) PO CAPS: 50000.0000 [IU] | ORAL_CAPSULE | ORAL | 2 refills | Status: DC

## 2022-01-07 NOTE — Addendum Note (Signed)
Addended by: Roney Jaffe on: 01/07/2022 12:04 PM ? ? Modules accepted: Orders ? ?

## 2022-01-07 NOTE — Progress Notes (Addendum)
? ? ?Patient ID: Barry Gutierrez, male    DOB: 1961/03/05  MRN: [id number] ? ?CC: Tremors ? ?Subjective: ?Barry Gutierrez is a 61 y.o. male who presents for tremors. ? ?His concerns today include:  ? ?Last appointment 01/01/2022 with Carrolyn Meiers, PA for emergency department follow-up.  ?Per PA note: ?States that he was seen in the emergency department in February 2023, note from that visit: ?  ?Barry Gutierrez is a 61 y.o. male who presents to the ED complaining tremors and generalized weakness.  He has associated sharp chest pain.  He notes that he notices chest pain most recently last night while at rest.  He did not take any medications for his symptoms.  He typically drinks between 2-3 beers daily with his last drink being last night.  Denies vision changes, fever, chills, nausea, vomiting.  Patient notes that he has been feeling generalized weakness and tremors since he had his surgery in February 2022.  Denies illicit drug use.  He has a history of Parkinson and his paternal great uncle.  He notes that he did let his primary care provider know of this. ?  ?Per patient chart review: Patient was evaluated for similar symptoms on 09/22/2021 in urgent care.  At that time he had a negative work-up, however elevated level of 0 below GEN noted.  Urgent care providers instructed patient to go to the ED for evaluation however patient did not go at that time.  Review of patient labs also notes that patient was not pleased that he did not have an exact cause of what his tremors were and felt as if he wasted his many, to see them.  Per Dr. Jenita Seashore the open quotes this is likely alcohol abuse related.  Will need a repeat CBC in 2-4 weeks.  Alcohol cessation is recommended." ?  ?Patient presents to the ED with 2-3 months of tremors and generalized weakness.  He has been evaluated by his primary care provider for his symptoms.  He has a history of Parkinson's disease in his paternal great uncle.  Vital signs, patient mildly  hypertensive in the ED, afebrile, not tachycardic or hypoxic.  Patient consumes approximately 2-3 beers nightly with his last drink being last night.  On exam patient with tremor noted in bilateral upper extremities noted at rest and with intentional movement.  No acute cardiovascular, respiratory, abdominal exam findings.  Differential diagnosis includes sepsis, ACS, alcohol withdrawal, acute cystitis. ?  ?Labs:  ?I ordered, and personally interpreted labs.  The pertinent results include:  ?Initial troponin at 10.  Repeat troponin 11. ?Ammonia slightly elevated at 40. ?Magnesium slightly decreased at 1.5. ?Urinalysis unremarkable without signs of infection. ?CBC unremarkable without acute leukocytosis. ?BMP overall unremarkable. ?  ?Imaging: ?I ordered imaging studies including chest x-ray ?I independently visualized and interpreted imaging which showed:  ?No active cardiopulmonary disease. Stable hyperinflated chest. ?Aortic atherosclerosis. ?  ?I agree with the radiologist interpretation ?  ?Disposition: ?Patient presentation suspicious for alcohol withdrawal.  Doubt ACS, sepsis, infection, acute cystitis as cause of symptoms.  After consideration of the diagnostic results and the patients response to treatment, I feel that the patient would benefit from discharge home with Librium and magnesium prescriptions.  We will also send ambulatory referral to neurology for follow-up regarding patient's tremor.  Case discussed with attending who also evaluated patient and agrees with discharge treatment plan.  Supportive care measures and strict return precautions discussed with patient at bedside. Pt acknowledges and verbalizes understanding. Pt  appears safe for discharge. Follow up as indicated in discharge paperwork.  ?   ?  ?States today that he normally does not believe that the tremors are from his alcohol use.  Does endorse that he drinks 3-4 alcoholic drinks on a daily basis.  But does not feel he is an alcoholic  and feels he can stop whenever he wants to. ?  ?States his last episode of tremors was Wednesday of last week, states that he feels very weak in his legs when this happens and this makes it very difficult for him to work. ? ?States that he believes his tremors are due to something else, states that he has a great uncle who had Parkinson's disease and wants this issue to be researched further for him ?  ?States that he has not been able to be seen by neurology, neurology's response to referral request ? 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  Patient declines referral for substance abuse treatment.  Does  want to do trial of Librium, states that he is able to stop on his own and has in the past. ?  ?Patient to return to clinic in the morning for fasting labs to be completed.  Patient given appointment to follow-up with primary care provider.  Red flags given for prompt reevaluation. ?  ?2. Alcohol abuse ?Trial Librium, thiamine.  Patient education given on withdrawal symptoms, red flags for prompt reevaluation. ?- chlordiazePOXIDE (LIBRIUM) 25 MG capsule; 50mg  PO TID x 1D, then 25-50mg  PO BID X 1D, then 25-50mg  PO QD X 1D  Dispense: 10 capsule; Refill: 0 ?- thiamine 100 MG tablet; Take 1 tablet (100 mg total) by mouth daily.  Dispense: 30 tablet; Refill: 1 ?  ?3. Tremor ?No tremor present during today's visit ?- TSH; Future ?- Vitamin B12; Future ?- Vitamin D, 25-hydroxy; Future ?  ?4. Alcohol withdrawal syndrome with complication ([place]) ?  ?- chlordiazePOXIDE (LIBRIUM) 25 MG capsule; 50mg  PO TID x 1D, then 25-50mg  PO BID X 1D, then 25-50mg  PO QD X 1D  Dispense: 10 capsule; Refill: 0 ?- thiamine 100 MG tablet; Take 1 tablet (100 mg total) by mouth daily.  Dispense: 30 tablet; Refill: 1 ?  ?5. Essential hypertension ?Patient strongly encouraged to follow-up with cardiology,  strongly encouraged to resume all medications.  Strongly encouraged to check blood pressure at home, keep a written log and have available for all office visits ?  ?6. Mixed hyperlipidemia ?  ?- Lipid panel; Future ?  ?7. S/P angioplasty with stent DES to RCA 11/15/20 ?  ?  ?8. Coronary artery disease involving native coronary artery of native heart without angina pectoris ?  ?  ?9. Screening PSA (prostate specific antigen) ?  ?- PSA; Future ?  ?10. BMI 28.0-28.9,adult ? ?  ?Neurology response to referral: ?Note:   ?From: Tat, Rebecca S, DO ?Sent: 12/16/2021   3:40 PM EDT ?To: [facility] ?We require all patients be seen and evaluated by primary care as many times, they can help and trial meds before the patients get to [place].  This patient has not seen pcp since last year ?States that he has stopped taking his blood thinner and cholesterol medications, states that he was skeptical and thought maybe these medications were causing his issues. ?States that he is continuing to take the carvedilol, Vascepa and Imdur.   ?States that he did  take the course of Librium in February after the emergency department visit but did resume alcohol intake afterwards. ? ?Today's Visit 01/10/2022: ?Patient reports tremors persisting intermittently. Tremors fluctuate from lasting day(s) to weeks.  ? ?Reports doesn't think tremors are related to alcohol consumption. Reports drinking alcohol since 61 years-old. States "I know all are y'all are so stuck on alcohol being the reason but its not." "Y'all think I drink a lot and I really don't." When asked of patient how much he drinks daily he states 5 to 6 beers. Says he doesn't drink everyday sometimes may be every other day. Patient states "Y'all think Im having alcohol withdrawal but Im not." Patient states "If I am having alcohol withdrawal then why is it that when I drink alcohol my tremors do not go away."  ? ?Patient went on to explain he has family history of Parkinson disease and  wants referral to Neurology. States "I want a referral to Neurology period." Acknowledges that his previous referral to Neurology was denied. However, he is adamant that a new referral be sent.  ? ?Patient stat

## 2022-01-09 ENCOUNTER — Telehealth: Payer: Self-pay | Admitting: *Deleted

## 2022-01-09 NOTE — Telephone Encounter (Signed)
Patient verified DOB ?Patient is aware of labs and needing to restart cholesterol medication as well as taking weekly vitamin d.  ?Patient reports atorvastatin makes him sick, patient advised to discuss an alternate with PCP on tomorrow. ?

## 2022-01-09 NOTE — Telephone Encounter (Signed)
-----   Message from Kennieth Rad, [place] sent at 01/07/2022 12:04 PM EDT ----- ?Please call patient and let him know that his liver function and thyroid function is WNL.  His screen for prostate cancer was negative.  His cholesterol is elevated and would benefit from restarting his atorvastatin. ? ?His vit d is very low, he needs to take 50,000 units once a week for 12 weeks and then have it rechecked at that time.  B12 levels were WNL.  Vit D sent to pharmacy  ? ?

## 2022-01-10 ENCOUNTER — Encounter: Payer: Self-pay | Admitting: Family

## 2022-01-10 ENCOUNTER — Ambulatory Visit (INDEPENDENT_AMBULATORY_CARE_PROVIDER_SITE_OTHER): Payer: BC Managed Care – PPO | Admitting: Family

## 2022-01-10 VITALS — BP 120/74 | HR 73 | Temp 98.3°F | Resp 18 | Ht 70.0 in | Wt 195.0 lb

## 2022-01-10 DIAGNOSIS — I700 Atherosclerosis of aorta: Secondary | ICD-10-CM

## 2022-01-10 DIAGNOSIS — I2510 Atherosclerotic heart disease of native coronary artery without angina pectoris: Secondary | ICD-10-CM

## 2022-01-10 DIAGNOSIS — G621 Alcoholic polyneuropathy: Secondary | ICD-10-CM | POA: Diagnosis not present

## 2022-01-10 DIAGNOSIS — F1010 Alcohol abuse, uncomplicated: Secondary | ICD-10-CM

## 2022-01-10 DIAGNOSIS — I10 Essential (primary) hypertension: Secondary | ICD-10-CM

## 2022-01-10 DIAGNOSIS — F10939 Alcohol use, unspecified with withdrawal, unspecified: Secondary | ICD-10-CM

## 2022-01-10 DIAGNOSIS — E785 Hyperlipidemia, unspecified: Secondary | ICD-10-CM

## 2022-01-10 DIAGNOSIS — Z0289 Encounter for other administrative examinations: Secondary | ICD-10-CM

## 2022-01-10 DIAGNOSIS — Z95820 Peripheral vascular angioplasty status with implants and grafts: Secondary | ICD-10-CM

## 2022-01-10 DIAGNOSIS — Z820 Family history of epilepsy and other diseases of the nervous system: Secondary | ICD-10-CM

## 2022-01-10 DIAGNOSIS — R251 Tremor, unspecified: Secondary | ICD-10-CM

## 2022-01-10 NOTE — Addendum Note (Signed)
Addended by: Rema Fendt on: 01/10/2022 12:16 PM ? ? Modules accepted: Orders ? ?

## 2022-01-10 NOTE — Progress Notes (Signed)
Pt presents for referral to neurology states that he still has the shakes  ?

## 2022-01-17 ENCOUNTER — Telehealth: Payer: Self-pay | Admitting: Family

## 2022-01-17 NOTE — Telephone Encounter (Signed)
Dr. Andrey Campanile if you will review my recent encounter with patient on 01/10/2022 in addition to Donna's note above. Please reply with advisement here so that it can be attached to patients chart. Thank you.  Lupita Leash, if patient feels most comfortable with an MD we may need to consider offering patient transition of care to Dr. Andrey Campanile. Thank you.

## 2022-01-17 NOTE — Telephone Encounter (Signed)
Called patient to let him know that the Telephone appt scheduled for 01-18-22 was going to be cancelled because pt had already been told that his FMLA paperwork would not be filled out until he has seen the Neurologist and Psychiatrist, that referrals had been put in for.  I spent 46 minutes on the phone with the patient listening to repetitive episodes beginning back in February. The patient kept insisting that "all Amy had to do was to fill out the paperwork so that he wouldn't loose his job or insurance." I repeatedly told the patient that per Amy, the PCP, that she could not effectively complete the FMLA paperwork until she had a more definitive DX from the Neurologist in regards to the patients tremors.  The patient kept saying that she was "just an NP and not even a doctor".  He kept insisting that I tell Amy to fill out the paperwork.  He eventually ended up threatening lawsuits against Amy and [facility] for "refusing" to take care of him. He also stated that he was not going to the Phychiatric referral because he was not crazy.

## 2022-01-18 ENCOUNTER — Telehealth: Payer: BC Managed Care – PPO | Admitting: Family

## 2022-02-12 ENCOUNTER — Ambulatory Visit (INDEPENDENT_AMBULATORY_CARE_PROVIDER_SITE_OTHER): Payer: BC Managed Care – PPO | Admitting: Neurology

## 2022-02-12 VITALS — BP 162/83 | HR 99 | Ht 70.0 in | Wt 189.0 lb

## 2022-02-12 DIAGNOSIS — R269 Unspecified abnormalities of gait and mobility: Secondary | ICD-10-CM

## 2022-02-12 DIAGNOSIS — G4733 Obstructive sleep apnea (adult) (pediatric): Secondary | ICD-10-CM | POA: Diagnosis not present

## 2022-02-12 DIAGNOSIS — R202 Paresthesia of skin: Secondary | ICD-10-CM

## 2022-02-12 DIAGNOSIS — R251 Tremor, unspecified: Secondary | ICD-10-CM | POA: Diagnosis not present

## 2022-02-12 NOTE — Progress Notes (Unsigned)
Chief Complaint  Patient presents with   New Patient (Initial Visit)    Room 14, alone  NP/ Internal referral for tremors, fam hx of parkinson's C/o tremors in both arms, worse right side  Reports 4-5 drinks of bourbon a day, pt is currently not working       ASSESSMENT AND PLAN  DANTA BAUMGARDNER is a 61 y.o. male  Balance issue  Hyperreflexia on examination intermittent right lateral to finger paresthesia,  MRI of cervical spine to rule out spondylitic myelopathy Dizziness, difficulty focusing,  MRI of the brain to rule out structural abnormality  At risk for obstructive sleep apnea  Excessive daytime sleepiness, fatigue, Small oropharyngeal space,  Referral to sleep study   DIAGNOSTIC DATA (LABS, IMAGING, TESTING) - I reviewed patient records, labs, notes, testing and imaging myself where available.   MEDICAL HISTORY:  GUNTER CONDE, is a 61 year old male seen in request by his primary care nurse practitioner Rema Fendt, for evaluation of tremor, initial evaluation was February 12, 2022  I reviewed and summarized the referring note. PMHX. HTN CAD Drink alcohol,   He reported episode of intermittent right fourth and fifth finger numbness over the past 1 month, intermittent, also complains of mild weakness at lower extremity especially with prolonged walking, mild unbalanced sensation,  He does drink moderate amount of alcohol on a daily basis, sometimes complains of hand shaking, traveling  paresthesia throughout his body, inside tremor, difficulty focusing,  He denies bowel and bladder incontinence  He complains of few years history of excessive daytime sleepiness, fatigue, he lives alone, was not sure if he has snoring or gasping episode   Labs May 2023 normal liver functional test, TSH, PSA, slight elevated LDL 104, B12 vitamin D, CBC,   PHYSICAL EXAM:   Vitals:   02/12/22 1507  BP: (!) 162/83  Pulse: 99  Weight: 189 lb (85.7 kg)  Height: 5\' 10"   (1.778 m)   Not recorded     Body mass index is 27.12 kg/m.  PHYSICAL EXAMNIATION:  Gen: NAD, conversant, well nourised, well groomed                     Cardiovascular: Regular rate rhythm, no peripheral edema, warm, nontender. Eyes: Conjunctivae clear without exudates or hemorrhage Neck: Supple, no carotid bruits. Pulmonary: Clear to auscultation bilaterally   NEUROLOGICAL EXAM:  MENTAL STATUS: Speech/cognition: Awake, alert, oriented to history taking and casual conversation CRANIAL NERVES: CN II: Visual fields are full to confrontation. Pupils are round equal and briskly reactive to light. CN III, IV, VI: extraocular movement are normal. No ptosis. CN V: Facial sensation is intact to light touch CN VII: Face is symmetric with normal eye closure  CN VIII: Hearing is normal to causal conversation. CN IX, X: Phonation is normal. CN XI: Head turning and shoulder shrug are intact CNXII: Narrow long pharyngeal space  MOTOR: There is no pronator drift of out-stretched arms. Muscle bulk and tone are normal. Muscle strength is normal.  REFLEXES: Reflexes are 2+ and symmetric at the biceps, triceps, knees, and ankles. Plantar responses are extensor bilaterally  SENSORY: Intact to light touch, pinprick and vibratory sensation are intact in fingers and toes.  COORDINATION: There is no trunk or limb dysmetria noted.  GAIT/STANCE: Posture is normal. Gait is steady, difficulty perform tandem walking  REVIEW OF SYSTEMS:  Full 14 system review of systems performed and notable only for as above All other review of systems were negative.  ALLERGIES: Allergies  Allergen Reactions   Rosuvastatin     Hungry and nauseous    HOME MEDICATIONS: Current Outpatient Medications  Medication Sig Dispense Refill   acetaminophen (TYLENOL) 325 MG tablet Take 325-650 mg by mouth every 6 (six) hours as needed for mild pain (for pain).     aspirin EC 81 MG EC tablet Take 1 tablet (81  mg total) by mouth daily. 90 tablet 3   carvedilol (COREG) 12.5 MG tablet Take 1 tablet (12.5 mg total) by mouth 2 (two) times daily with a meal. 60 tablet 1   icosapent Ethyl (VASCEPA) 1 g capsule Take 2 capsules (2 g total) by mouth 2 (two) times daily. 360 capsule 3   nitroGLYCERIN (NITROSTAT) 0.4 MG SL tablet Place 1 tablet (0.4 mg total) under the tongue every 5 (five) minutes x 3 doses as needed for chest pain. 25 tablet 1   Vitamin D, Ergocalciferol, (DRISDOL) 1.25 MG (50000 UNIT) CAPS capsule Take 1 capsule (50,000 Units total) by mouth every 7 (seven) days. 4 capsule 2   isosorbide mononitrate (IMDUR) 60 MG 24 hr tablet Take 1 tablet (60 mg total) by mouth daily. 90 tablet 3   No current facility-administered medications for this visit.    PAST MEDICAL HISTORY: Past Medical History:  Diagnosis Date   Allergy    MI (myocardial infarction) (HCC)    S/P angioplasty with stent DES to RCA 11/15/20 11/15/2020    PAST SURGICAL HISTORY: Past Surgical History:  Procedure Laterality Date   CORONARY STENT INTERVENTION N/A 11/15/2020   Procedure: CORONARY STENT INTERVENTION;  Surgeon: Corky CraftsVaranasi, Jayadeep S, MD;  [facility];  Service: Cardiovascular;  Laterality: N/A;   LEFT HEART CATH AND CORONARY ANGIOGRAPHY N/A 08/04/2019   Procedure: LEFT HEART CATH AND CORONARY ANGIOGRAPHY;  Surgeon: Iran OuchArida, Muhammad A, MD;  Location: [facility] INVASIVE CV LAB;  Service: Cardiovascular;  Laterality: N/A;   LEFT HEART CATH AND CORONARY ANGIOGRAPHY N/A 11/15/2020   Procedure: LEFT HEART CATH AND CORONARY ANGIOGRAPHY;  Surgeon: Corky CraftsVaranasi, Jayadeep S, MD;  [facility];  Service: Cardiovascular;  Laterality: N/A;    FAMILY HISTORY: Family History  Problem Relation Age of Onset   Cancer Mother    CAD Mother    Coronary artery disease Mother    Diabetes Father    CAD Father    Coronary artery disease Father     SOCIAL HISTORY: Social History   Socioeconomic History   Marital  status: Legally Separated    Spouse name: Not on file   Number of children: Not on file   Years of education: Not on file   Highest education level: Not on file  Occupational History   Not on file  Tobacco Use   Smoking status: Former    Packs/day: 2.00    Types: Cigarettes    Quit date: 09/2021    Years since quitting: 0.4    Passive exposure: Never   Smokeless tobacco: Never  Substance and Sexual Activity   Alcohol use: Yes   Drug use: Yes    Types: Marijuana    Comment: every other month or so    Sexual activity: Not Currently  Other Topics Concern   Not on file  Social History Narrative   Not on file   Social Determinants of Health   Financial Resource Strain: Not on file  Food Insecurity: Not on file  Transportation Needs: No Transportation Needs (08/01/2019)   PRAPARE - Transportation  Lack of Transportation (Medical): No    Lack of Transportation (Non-Medical): No  Physical Activity: Not on file  Stress: Not on file  Social Connections: Not on file  Intimate Partner Violence: Not on file      Levert Feinstein, M.D. Ph.D.  [facility] [address] [place], [place] [address] Ph: [phone] Fax: [phone]  CC:  Rema Fendt, NP [address] [place],  [place] [address]  Rema Fendt, NP

## 2022-02-13 ENCOUNTER — Encounter: Payer: Self-pay | Admitting: Neurology

## 2022-02-13 NOTE — Telephone Encounter (Signed)
Pt called in stating he just saw the neurologist yesterday 6/13 and now needs FMLA paperwork completed, please advise.

## 2022-02-15 ENCOUNTER — Telehealth: Payer: Self-pay | Admitting: Neurology

## 2022-02-15 NOTE — Telephone Encounter (Signed)
MRI Brain [employer] Auth: [id number] exp. 02/15/22-03/16/22 MRI Cervical BCBS Berkley Harvey: [id number] exp. 02/15/22-03/16/22 sent to GI

## 2022-02-19 ENCOUNTER — Telehealth: Payer: Self-pay | Admitting: Neurology

## 2022-02-19 NOTE — Telephone Encounter (Signed)
Pt called wanting to know if he is able to get FMLA paperwork done for his work due to his tremors. Please advise.

## 2022-02-19 NOTE — Telephone Encounter (Signed)
Pt made an additional call and is upset his FMLA paperwork has not been completed, please advise.

## 2022-02-19 NOTE — Telephone Encounter (Signed)
Amber from [employer] called stating she is needing the pt's MRI orders sent over to [employer] [phone]

## 2022-02-19 NOTE — Telephone Encounter (Signed)
Left message for a return call

## 2022-02-19 NOTE — Telephone Encounter (Signed)
I spoke to the patient. He has been out of work since 12/05/2020. He is still employed by [employer]. 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 He does not feel he can perform his duties at this time. Says he would like to maintain his position with the company so he can return to work once he is better. He has been using his vacation and sick time but it is nearly out now. His employer will require FMLA paperwork to be completed.  His complaints are the same: episodes of dizziness, difficulty focusing, numbness/tremors in hands. Says he feels also feels so weak some days that he has to stay in the bed longer.   He denies any illicit drug use. Admits to still having 4-5 bourbon drinks each evening but says this in not new for him.   Feels symptoms have been worsening since his heart attack about 1.5 years ago.   He is scheduled for his MRI brain & cervical spine on 02/28/22. He would like to be out at least until those results come back.

## 2022-02-20 NOTE — Telephone Encounter (Signed)
I spoke to the patient and he said he is going to keep his appointment at GI on 02/28/22

## 2022-02-20 NOTE — Telephone Encounter (Signed)
Ok to write him off until June 30th (including).

## 2022-02-21 NOTE — Telephone Encounter (Addendum)
I spoke to the patient. Now he is saying another physician has filed FMLA ppw for him and he does not need anything additional at this time. He could not tell my which physician did this for him.   He will complete his MRI scans on 02/28/22.   Once they are resulted, he would like to know Dr. Zannie Cove long term plan for him. He is unsure he can return to work any time soon.  If he remains under neurology care, he would like to discuss his job.   If he is released back to his PCP, he will discuss steps with them.

## 2022-02-28 ENCOUNTER — Ambulatory Visit
Admission: RE | Admit: 2022-02-28 | Discharge: 2022-02-28 | Disposition: A | Discharge status: Home / Self Care | Payer: BC Managed Care – PPO | Source: Ambulatory Visit | Attending: Neurology | Admitting: Neurology

## 2022-02-28 DIAGNOSIS — R202 Paresthesia of skin: Secondary | ICD-10-CM | POA: Diagnosis not present

## 2022-02-28 DIAGNOSIS — R251 Tremor, unspecified: Secondary | ICD-10-CM | POA: Diagnosis not present

## 2022-02-28 DIAGNOSIS — G4733 Obstructive sleep apnea (adult) (pediatric): Secondary | ICD-10-CM

## 2022-02-28 DIAGNOSIS — R269 Unspecified abnormalities of gait and mobility: Secondary | ICD-10-CM

## 2022-03-01 ENCOUNTER — Telehealth: Payer: Self-pay | Admitting: Neurology

## 2022-03-01 NOTE — Telephone Encounter (Signed)
Please call patient, MRI of the brain showed mild generalized atrophy, no acute abnormality  MRI of the cervical showed significant degenerative changes, with moderate stenosis at C3-4, C4-5, variable degree of foraminal narrowing  If he has a lot of questions about his MRI report, it is okay to give him a follow-up visit, in person or virtual   IMPRESSION: This MRI of the brain without contrast shows the following: 1.   Mild generalized cortical atrophy, more than typical for age.  This has slightly progressed compared to the CT scan from 06/09/2020 2.   Punctate T2/FLAIR hyperintense foci in the hemispheres consistent with mild chronic microvascular ischemic change. 3.   Single chronic microhemorrhage in the subcortical white matter of the right parietal lobe.  As a single focus, this is unlikely to be clinically significant and etiology is uncertain. 4.   No acute findings.      IMPRESSION: This MRI of the cervical spine without contrast shows the following: 1.   The spinal cord appears normal. 2.   There is moderate spinal stenosis at C3-C4 and C4-C5 and mild spinal stenosis at C5-C6, C6-C7 and C7 and T1 due to degenerative changes.  There are various degrees of foraminal narrowing with some potential for right C4, bilateral C6 and right C7 nerve root compression.

## 2022-03-04 NOTE — Telephone Encounter (Signed)
May schedule him a in person follow up with me, but I am not sure that all his symptoms and difficulties are due to his cervical degenerative changes.

## 2022-03-04 NOTE — Telephone Encounter (Signed)
I spoke to the patient and provided his MRI results. He would like to know the plan for his long-term care.  ___________________________________ Previous telephone note:  He has been out of work since 12/05/2020. He is still employed by [employer]. 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 He does not feel he can perform his duties at this time. Says he would like to maintain his position with the company so he can return to work once he is better. He has been using his vacation and sick time but it is nearly out now. His employer will require FMLA paperwork to be completed.   His complaints are the same: episodes of dizziness, difficulty focusing, numbness/tremors in hands. Says he feels also feels so weak some days that he has to stay in the bed longer.    He denies any illicit drug use. Admits to still having 4-5 bourbon drinks each evening but says this in not new for him.    Feels symptoms have been worsening since his heart attack about 1.5 years ago.

## 2022-03-06 ENCOUNTER — Ambulatory Visit (INDEPENDENT_AMBULATORY_CARE_PROVIDER_SITE_OTHER): Payer: BC Managed Care – PPO | Admitting: Neurology

## 2022-03-06 ENCOUNTER — Telehealth: Payer: Self-pay | Admitting: Neurology

## 2022-03-06 ENCOUNTER — Encounter: Payer: Self-pay | Admitting: Neurology

## 2022-03-06 ENCOUNTER — Telehealth: Payer: Self-pay | Admitting: Family

## 2022-03-06 VITALS — BP 99/62 | HR 87 | Ht 70.0 in | Wt 189.0 lb

## 2022-03-06 DIAGNOSIS — R202 Paresthesia of skin: Secondary | ICD-10-CM | POA: Diagnosis not present

## 2022-03-06 DIAGNOSIS — R269 Unspecified abnormalities of gait and mobility: Secondary | ICD-10-CM

## 2022-03-06 DIAGNOSIS — R251 Tremor, unspecified: Secondary | ICD-10-CM

## 2022-03-06 NOTE — Telephone Encounter (Signed)
Copied from CRM [id number]. Topic: General - Inquiry >> Mar 06, 2022 11:23 AM Tiffany B wrote: Reason for CRM: Patient is faxing over [facility] paperwork please noted when received. Patient saw Neuro today and as per office notes his brain is shrinking. If FMLA forms are not filled out sooner then later by PCP patient will lose his job. Patient states PCP stated he threatened her but he states he did not or does not remember and if he did he is sorry.  *Amy,we did receive a faxed letter from the Neurologist which is in your tray up front.

## 2022-03-06 NOTE — Telephone Encounter (Signed)
Please cancel his follow-up appointment with Shanda Bumps in October 2023  I have referred him to sleep study, he is a good candidate for obstructive sleep apnea, if patient agree please make sure he is on schedule for that

## 2022-03-06 NOTE — Telephone Encounter (Signed)
Follow up w/ Shanda Bumps cancelled. [facility] sleep team will follow up on the referral.

## 2022-03-06 NOTE — Telephone Encounter (Signed)
I spoke to the patient. He does not have access to his mychart acct. He has been added to the schedule for an in-office visit today.

## 2022-03-06 NOTE — Progress Notes (Signed)
Chief Complaint  Patient presents with   Follow-up    Room 12, alone  MRI and paper work review       ASSESSMENT AND PLAN  Barry Gutierrez is a 61 y.o. male  Balance issue  Hyperreflexia on examination intermittent right lateral 2 finger paresthesia,  MRI of cervical spine did show multilevel degenerative changes, moderate stenosis at C3-4, C4-5, no cord signal abnormality, variable degree of foraminal narrowing,  Is overall functioning well, not a surgical candidate,  Dizziness, difficulty focusing,  MRI of the brain showed more than age-appropriate mild generalized atrophy, also including superior cerebellum  At risk for obstructive sleep apnea  Excessive daytime sleepiness, fatigue, Small oropharyngeal space,  Has already referred him to sleep study,     DIAGNOSTIC DATA (LABS, IMAGING, TESTING) - I reviewed patient records, labs, notes, testing and imaging myself where available.   MEDICAL HISTORY:  Barry Gutierrez, is a 61 year old male seen in request by his primary care nurse practitioner Rema Fendt, for evaluation of tremor, initial evaluation was February 12, 2022  I reviewed and summarized the referring note. PMHX. HTN CAD Drink alcohol,   He reported episode of intermittent right fourth and fifth finger numbness over the past 1 month, intermittent, also complains of mild weakness at lower extremity especially with prolonged walking, mild unbalanced sensation,  He does drink moderate amount of alcohol on a daily basis, sometimes complains of hand shaking, traveling  paresthesia throughout his body, inside tremor, difficulty focusing,  He denies bowel and bladder incontinence  He complains of few years history of excessive daytime sleepiness, fatigue, he lives alone, was not sure if he has snoring or gasping episode   Labs May 2023 normal liver functional test, TSH, PSA, slight elevated LDL 104, B12 vitamin D, CBC,  Update March 06, 2022: Patient   continued complaints of generalized fatigue, lack of stamina, he is a high risk for obstructive sleep apnea, sleep referral is placed  We personally reviewed MRI of cervical spine, multilevel degenerative changes, moderate spinal stenosis at C3-4, C4-5, with no evidence of cord signal abnormality, variable degree of foraminal narrowing  MRI of the brain showed generalized atrophy, more than typical for age, especially superior cerebellum  He did reported a history of at least moderate hard liquor intake in the past, stated quit drinking 3 weeks ago,   PHYSICAL EXAM:   Vitals:   03/06/22 0912  BP: 99/62  Pulse: 87  Weight: 189 lb (85.7 kg)  Height: 5\' 10"  (1.778 m)   Not recorded     Body mass index is 27.12 kg/m.  PHYSICAL EXAMNIATION:  Gen: NAD, conversant, well nourised, well groomed                     Cardiovascular: Regular rate rhythm, no peripheral edema, warm, nontender. Eyes: Conjunctivae clear without exudates or hemorrhage Neck: Supple, no carotid bruits. Pulmonary: Clear to auscultation bilaterally   NEUROLOGICAL EXAM:  MENTAL STATUS: Speech/cognition: Awake, alert, oriented to history taking and casual conversation CRANIAL NERVES: CN II: Visual fields are full to confrontation. Pupils are round equal and briskly reactive to light. CN III, IV, VI: extraocular movement are normal. No ptosis. CN V: Facial sensation is intact to light touch CN VII: Face is symmetric with normal eye closure  CN VIII: Hearing is normal to causal conversation. CN IX, X: Phonation is normal. CN XI: Head turning and shoulder shrug are intact CNXII: Narrow long pharyngeal space  MOTOR: Normal strength, Flat REFLEXES: Reflexes are 2+ and symmetric at the biceps, triceps, 3/3 knees, and trace at ankles. Plantar responses are slower bilaterally  SENSORY: Intact to light touch, pinprick and vibratory sensation are intact in fingers and toes.  COORDINATION: No limb dysmetria,  mild truncal ataxia   GAIT/STANCE: He can get up from squatting and seated position arm crossed, posture is normal. Gait is steady, difficulty perform tandem walking,  REVIEW OF SYSTEMS:  Full 14 system review of systems performed and notable only for as above All other review of systems were negative.   ALLERGIES: Allergies  Allergen Reactions   Rosuvastatin     Hungry and nauseous    HOME MEDICATIONS: Current Outpatient Medications  Medication Sig Dispense Refill   acetaminophen (TYLENOL) 325 MG tablet Take 325-650 mg by mouth every 6 (six) hours as needed for mild pain (for pain).     aspirin EC 81 MG EC tablet Take 1 tablet (81 mg total) by mouth daily. 90 tablet 3   carvedilol (COREG) 12.5 MG tablet Take 1 tablet (12.5 mg total) by mouth 2 (two) times daily with a meal. 60 tablet 1   icosapent Ethyl (VASCEPA) 1 g capsule Take 2 capsules (2 g total) by mouth 2 (two) times daily. 360 capsule 3   isosorbide mononitrate (IMDUR) 60 MG 24 hr tablet Take 1 tablet (60 mg total) by mouth daily. 90 tablet 3   nitroGLYCERIN (NITROSTAT) 0.4 MG SL tablet Place 1 tablet (0.4 mg total) under the tongue every 5 (five) minutes x 3 doses as needed for chest pain. 25 tablet 1   Vitamin D, Ergocalciferol, (DRISDOL) 1.25 MG (50000 UNIT) CAPS capsule Take 1 capsule (50,000 Units total) by mouth every 7 (seven) days. 4 capsule 2   No current facility-administered medications for this visit.    PAST MEDICAL HISTORY: Past Medical History:  Diagnosis Date   Allergy    MI (myocardial infarction) (HCC)    S/P angioplasty with stent DES to RCA 11/15/20 11/15/2020    PAST SURGICAL HISTORY: Past Surgical History:  Procedure Laterality Date   CORONARY STENT INTERVENTION N/A 11/15/2020   Procedure: CORONARY STENT INTERVENTION;  Surgeon: Corky Crafts, MD;  [facility]: [facility] INVASIVE CV LAB;  Service: Cardiovascular;  Laterality: N/A;   LEFT HEART CATH AND CORONARY ANGIOGRAPHY N/A 08/04/2019    Procedure: LEFT HEART CATH AND CORONARY ANGIOGRAPHY;  Surgeon: Iran Ouch, MD;  Location: [facility] INVASIVE CV LAB;  Service: Cardiovascular;  Laterality: N/A;   LEFT HEART CATH AND CORONARY ANGIOGRAPHY N/A 11/15/2020   Procedure: LEFT HEART CATH AND CORONARY ANGIOGRAPHY;  Surgeon: Corky Crafts, MD;  Location: [facility] INVASIVE CV LAB;  Service: Cardiovascular;  Laterality: N/A;    FAMILY HISTORY: Family History  Problem Relation Age of Onset   Cancer Mother    CAD Mother    Coronary artery disease Mother    Diabetes Father    CAD Father    Coronary artery disease Father     SOCIAL HISTORY: Social History   Socioeconomic History   Marital status: Legally Separated    Spouse name: Not on file   Number of children: Not on file   Years of education: Not on file   Highest education level: Not on file  Occupational History   Not on file  Tobacco Use   Smoking status: Former    Packs/day: 2.00    Types: Cigarettes    Quit date: 09/2021    Years since quitting: 0.5  Passive exposure: Never   Smokeless tobacco: Never  Substance and Sexual Activity   Alcohol use: Yes   Drug use: Yes    Types: Marijuana    Comment: every other month or so    Sexual activity: Not Currently  Other Topics Concern   Not on file  Social History Narrative   Not on file   Social Determinants of Health   Financial Resource Strain: Not on file  Food Insecurity: Not on file  Transportation Needs: No Transportation Needs (08/01/2019)   PRAPARE - Administrator, Civil Service (Medical): No    Lack of Transportation (Non-Medical): No  Physical Activity: Not on file  Stress: Not on file  Social Connections: Not on file  Intimate Partner Violence: Not on file      Levert Feinstein, M.D. Ph.D.  [facility] [address], [address] [place], [place] [address] Ph: [phone] Fax: [phone]  CC:  Rema Fendt, NP [address] [place],  [place]  [address]  Rema Fendt, NP

## 2022-03-06 NOTE — Telephone Encounter (Signed)
I have discussed in detail with Georganna Skeans, MD. Patient was directed to Neurology for completion of requested FMLA.

## 2022-03-11 ENCOUNTER — Telehealth: Payer: Self-pay | Admitting: Family

## 2022-03-11 NOTE — Telephone Encounter (Signed)
Please consult with Dr. Andrey Campanile next steps. I listed my reply from 03/06/2022 for review.  I discussed in detail with Georganna Skeans, MD. Patient was directed to Neurology for completion of requested FMLA.

## 2022-03-11 NOTE — Telephone Encounter (Signed)
Copied from CRM [id number]. Topic: General - Other >> Mar 11, 2022  4:12 PM Marlow Baars wrote: Reason for CRM: Patient called in to check status on his FMLA paperwork. He states he checked with the neurologist but the neurologist states they don't do FMLA paperwork and that it needs to be completed by his primary. Patient states the paperwork needs to be in by Wednesday or he will lose his job. The paperwork needs to be from April 5th-June 15th. Please assist patient further.

## 2022-03-13 ENCOUNTER — Telehealth: Payer: Self-pay | Admitting: Neurology

## 2022-03-13 NOTE — Telephone Encounter (Signed)
Received FMLA paperwork from [facility], will give to medical records

## 2022-04-04 ENCOUNTER — Other Ambulatory Visit: Payer: Self-pay | Admitting: Internal Medicine

## 2022-04-08 ENCOUNTER — Other Ambulatory Visit: Payer: Self-pay | Admitting: Internal Medicine

## 2022-04-15 ENCOUNTER — Other Ambulatory Visit: Payer: Self-pay

## 2022-04-15 MED ORDER — CARVEDILOL 12.5 MG PO TABS: 12.5000 mg | ORAL_TABLET | Freq: Two times a day (BID) | ORAL | 1 refills | Status: DC

## 2022-04-15 MED ORDER — CLOPIDOGREL BISULFATE 75 MG PO TABS: 75.0000 mg | ORAL_TABLET | Freq: Every day | ORAL | 2 refills | Status: DC

## 2022-04-30 ENCOUNTER — Other Ambulatory Visit: Payer: Self-pay | Admitting: Physician Assistant

## 2022-04-30 DIAGNOSIS — E559 Vitamin D deficiency, unspecified: Secondary | ICD-10-CM

## 2022-04-30 NOTE — Telephone Encounter (Signed)
Requested medication (s) are due for refill today: yes  Requested medication (s) are on the active medication list: yes  Last refill:  01/07/22 4 capsules with 2 RF  Future visit scheduled: no, seen 01/10/22  Notes to clinic:  This medication can not be delegated, please assess.        Requested Prescriptions  Pending Prescriptions Disp Refills   Vitamin D, Ergocalciferol, (DRISDOL) 1.25 MG (50000 UNIT) CAPS capsule [Pharmacy Med Name: VITAMIN D2 50,000IU (ERGO) CAP RX] 4 capsule 2    Sig: TAKE 1 CAPSULE BY MOUTH EVERY 7 DAYS     Endocrinology:  Vitamins - Vitamin D Supplementation 2 Failed - 04/30/2022 10:09 AM      Failed - Manual Review: Route requests for 50,000 IU strength to the provider      Failed - Vitamin D in normal range and within 360 days    Vit D, 25-Hydroxy  Date Value Ref Range Status  01/02/2022 10.2 (L) 30.0 - 100.0 ng/mL Final    Comment:    Vitamin D deficiency has been defined by the Institute of Medicine and an Endocrine Society practice guideline as a level of serum 25-OH vitamin D less than 20 ng/mL (1,2). The Endocrine Society went on to further define vitamin D insufficiency as a level between 21 and 29 ng/mL (2). 1. IOM (Institute of Medicine). 2010. Dietary reference    intakes for calcium and D. [place]: The    [employer]. 2. Holick MF, Binkley Phelan, Bischoff-Ferrari HA, et al.    Evaluation, treatment, and prevention of vitamin D    deficiency: an Endocrine Society clinical practice    guideline. JCEM. 2011 Jul; 96(7):1911-30.          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

## 2022-05-17 ENCOUNTER — Other Ambulatory Visit: Payer: Self-pay | Admitting: Physician Assistant

## 2022-05-17 DIAGNOSIS — E559 Vitamin D deficiency, unspecified: Secondary | ICD-10-CM

## 2022-06-06 ENCOUNTER — Ambulatory Visit: Payer: BC Managed Care – PPO | Admitting: Adult Health

## 2022-06-17 ENCOUNTER — Other Ambulatory Visit: Payer: Self-pay | Admitting: Internal Medicine

## 2022-06-24 ENCOUNTER — Other Ambulatory Visit: Payer: Self-pay | Admitting: Internal Medicine

## 2022-06-25 ENCOUNTER — Other Ambulatory Visit: Payer: Self-pay | Admitting: Internal Medicine

## 2022-11-01 ENCOUNTER — Telehealth: Payer: Self-pay | Admitting: Licensed Clinical Social Worker

## 2022-11-01 NOTE — Patient Instructions (Signed)
Visit Information  Thank you for taking time to visit with me today. Please don't hesitate to contact me if I can be of assistance to you.   Following are the goals we discussed today:   Goals Addressed             This Visit's Progress    COMPLETED: Obtain Medical Coverage   On track    Activities and task to complete in order to accomplish goals.   Review supportive resources provided via email [place] to establish with a PCP and/or meet with a Development worker, community to complete application for [employer]  Program         If you are experiencing a [place] or [place] or need someone to talk to, please call the Suicide and Crisis Lifeline: 988 call 911   Patient verbalizes understanding of instructions and care plan provided today and agrees to view in [place]. Active MyChart status and patient understanding of how to access instructions and care plan via MyChart confirmed with patient.     No further follow up required: Pt encouraged to contact LCSW with any questions or needs  Christa See, MSW, [email or web address] Phone [phone] 2:00 PM

## 2022-11-01 NOTE — Patient Outreach (Addendum)
  Care Coordination   Initial Visit Note   11/01/2022 Name: Barry Gutierrez MRN: [id number] DOB: 14-Jun-1961  Barry Gutierrez is a 62 y.o. year old male who sees No primary care provider on file. for primary care. I spoke with  Jennette Bill by phone today.  What matters to the patients health and wellness today?  Assessed patient's needs, support system and barriers to care.     Goals Addressed             This Visit's Progress    COMPLETED: Obtain Medical Coverage   On track    Activities and task to complete in order to accomplish goals.   Review supportive resources provided via email [place] to establish with a PCP and/or meet with a Development worker, community to complete application for [employer]  Program         SDOH assessments and interventions completed:  Yes  SDOH Interventions Today    Flowsheet Row Most Recent Value  SDOH Interventions   Financial Strain Interventions Financial Counselor  [LCSW discussed Licensed conveyancer and will provide application via email.]        Care Coordination Interventions:  Yes, provided  Interventions Today    Flowsheet Row Most Recent Value  Chronic Disease   Chronic disease during today's visit Hypertension (HTN), Other  [CAD]  General Interventions   General Interventions Discussed/Reviewed General Interventions Discussed, Community Resources  [LCSW reviewed care coordination services. LCSW provided information on how to establish with a new PCP and apply for financial assistance for medical coverage]       Follow up plan: No further intervention required. Pt strongly encouraged to contact LCSW with any additional resource info and/or questions  Encounter Outcome:  Pt. Visit Completed

## 2023-02-04 IMAGING — CR DG CHEST 2V
2 series · 2 of 2 positions shown · non-contrast
Comparison: PA Lat 11/14/2020.

CLINICAL DATA: Intermittent weakness and pain.

EXAM:
CHEST - 2 VIEW

[chest pa]
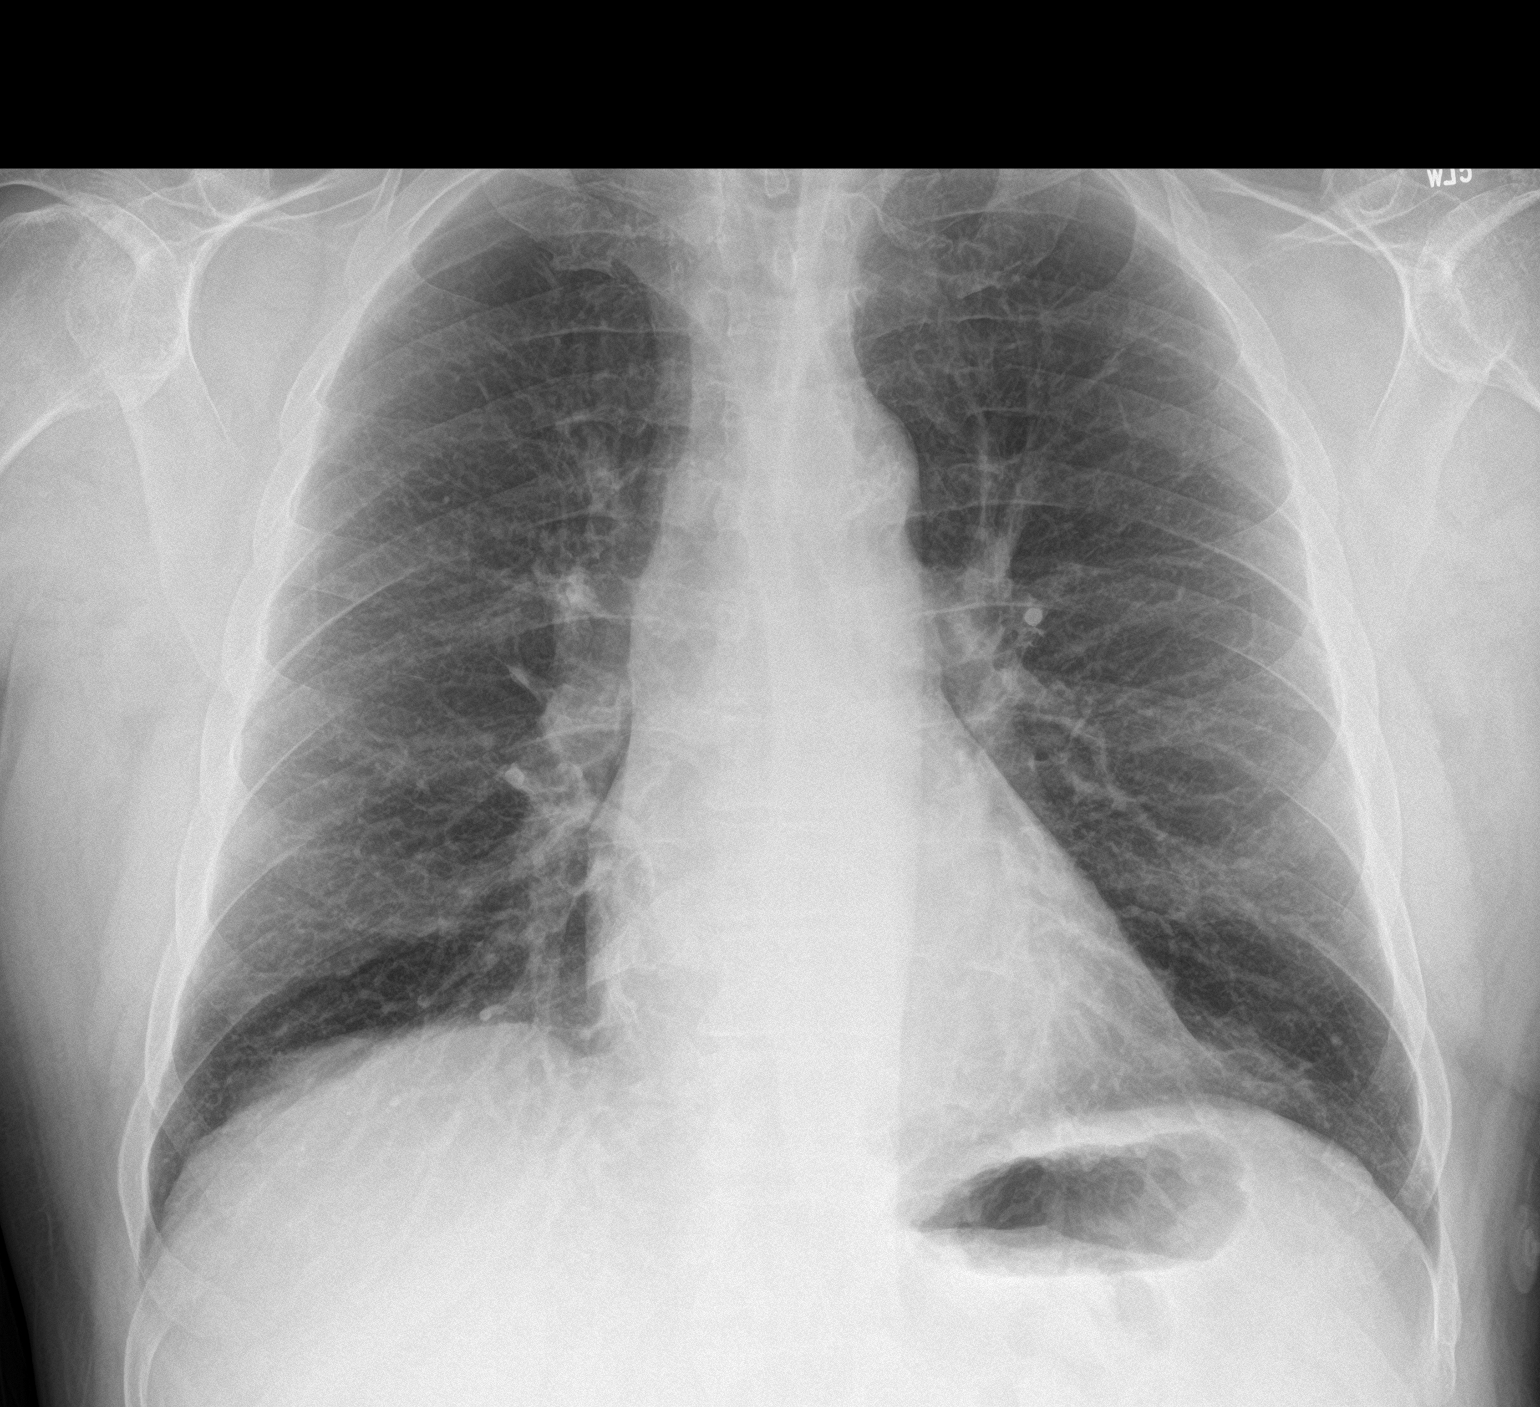

[chest lat]
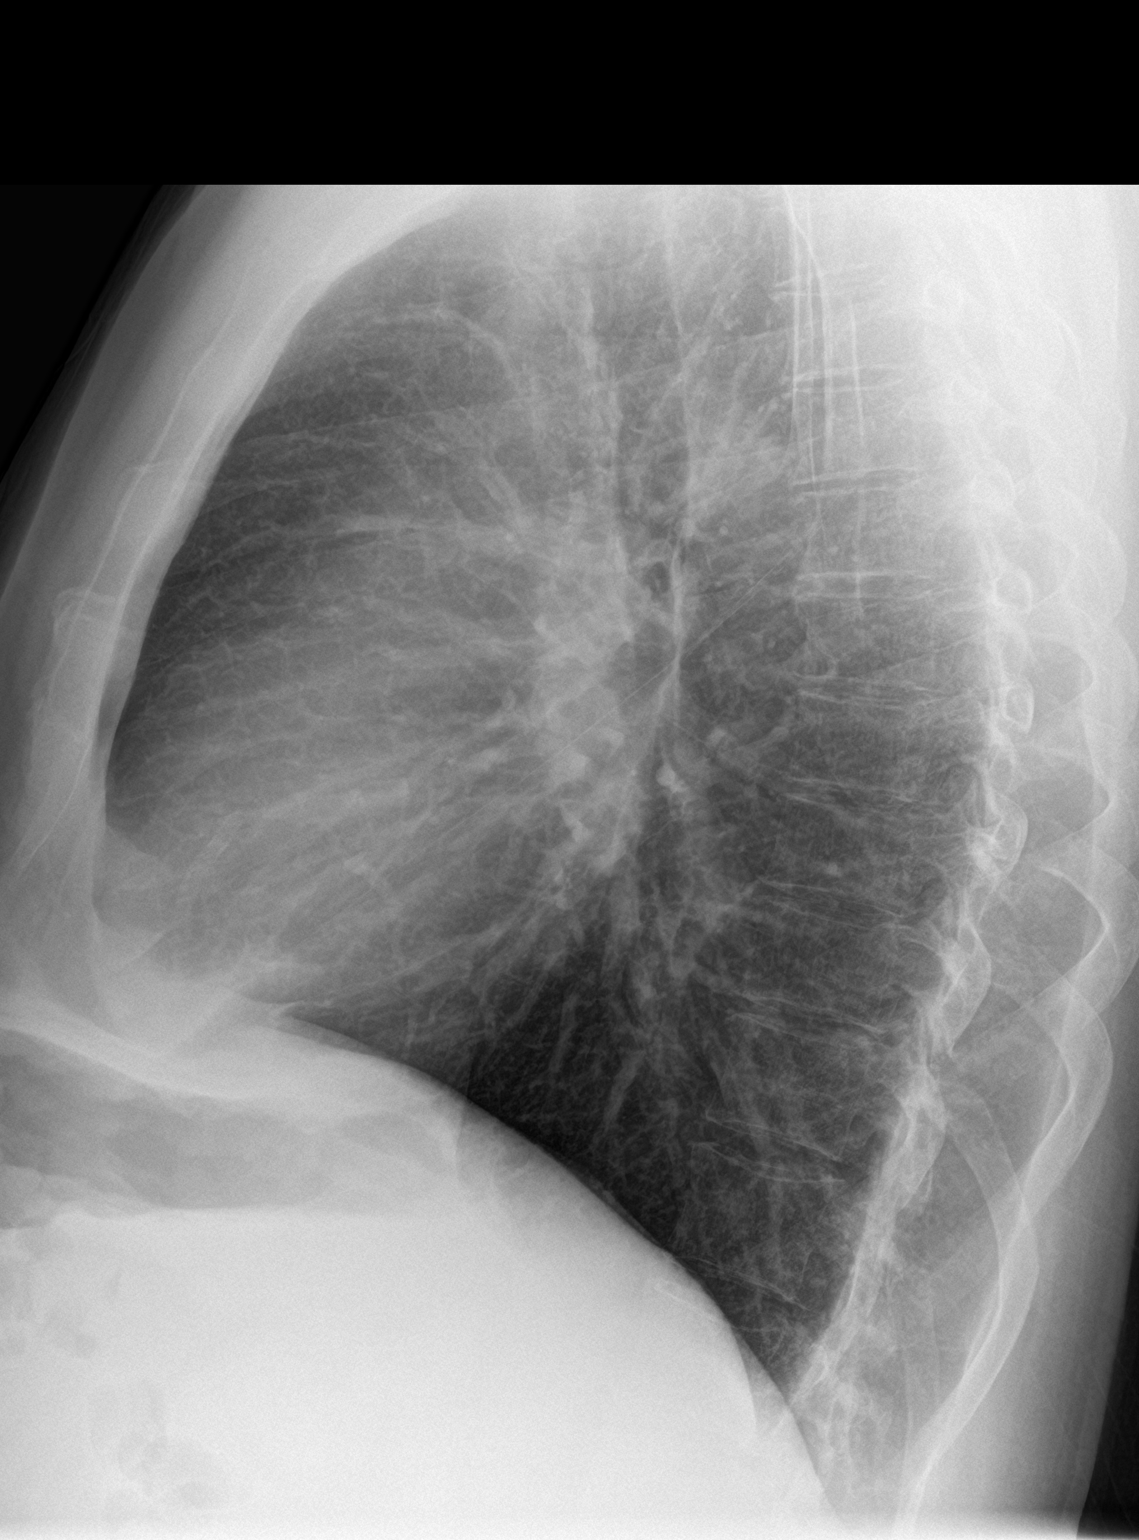

[2 of 2 positions shown; findings below may reference images not displayed]

FINDINGS: The heart size and mediastinal contours are within normal limits.
There are calcifications in the aortic arch. Both lungs are
hyperinflated but clear. The visualized skeletal structures are
unremarkable apart from mild osteopenia.
IMPRESSION: No active cardiopulmonary disease. Stable hyperinflated chest.
Aortic atherosclerosis.

## 2023-06-23 ENCOUNTER — Emergency Department (HOSPITAL_BASED_OUTPATIENT_CLINIC_OR_DEPARTMENT_OTHER): Payer: BC Managed Care – PPO

## 2023-06-23 ENCOUNTER — Other Ambulatory Visit: Payer: Self-pay

## 2023-06-23 ENCOUNTER — Encounter (HOSPITAL_BASED_OUTPATIENT_CLINIC_OR_DEPARTMENT_OTHER): Payer: Self-pay | Admitting: Pediatrics

## 2023-06-23 ENCOUNTER — Emergency Department (HOSPITAL_BASED_OUTPATIENT_CLINIC_OR_DEPARTMENT_OTHER)
Admission: EM | Admit: 2023-06-23 | Discharge: 2023-06-23 | Disposition: A | Discharge status: Home / Self Care | Payer: BC Managed Care – PPO | Attending: Emergency Medicine | Admitting: Emergency Medicine

## 2023-06-23 DIAGNOSIS — W010XXA Fall on same level from slipping, tripping and stumbling without subsequent striking against object, initial encounter: Secondary | ICD-10-CM | POA: Insufficient documentation

## 2023-06-23 DIAGNOSIS — Z7982 Long term (current) use of aspirin: Secondary | ICD-10-CM | POA: Diagnosis not present

## 2023-06-23 DIAGNOSIS — W19XXXA Unspecified fall, initial encounter: Secondary | ICD-10-CM

## 2023-06-23 DIAGNOSIS — Y9301 Activity, walking, marching and hiking: Secondary | ICD-10-CM | POA: Insufficient documentation

## 2023-06-23 DIAGNOSIS — M25471 Effusion, right ankle: Secondary | ICD-10-CM | POA: Diagnosis not present

## 2023-06-23 DIAGNOSIS — M25561 Pain in right knee: Secondary | ICD-10-CM | POA: Diagnosis present

## 2023-06-23 DIAGNOSIS — I2510 Atherosclerotic heart disease of native coronary artery without angina pectoris: Secondary | ICD-10-CM | POA: Diagnosis not present

## 2023-06-23 DIAGNOSIS — Z7901 Long term (current) use of anticoagulants: Secondary | ICD-10-CM | POA: Diagnosis not present

## 2023-06-23 MED ORDER — ACETAMINOPHEN 500 MG PO TABS
1000.0000 mg | ORAL_TABLET | Freq: Once | ORAL | Status: AC
  Administered 2023-06-23: 1000 mg via ORAL
  Filled 2023-06-23: qty 2

## 2023-06-23 NOTE — ED Provider Notes (Signed)
[place] [facility] Provider Note   CSN: [id number] Arrival date & time: 06/23/23  1259     History  Chief Complaint  Patient presents with   Fall   HPI Barry Gutierrez is a 62 y.o. male with CAD with history of MI and stent placed in 2022 presented for fall. Occurred 3 days ago. He was walking to meet someone, tripped and fell into a ditch. States he "hit a little bit of everything". Denies head injury or LOC. Now endorsing right knee pain that started yesterday.  The pain is located about the medial aspect of the knee just above the knee joint.  Still able to ambulate and bear weight.  Also states he did have some ankle pain which has gone away but has some swelling in that right ankle still.  Patient also mention that his back was hurting right after the fall but has improved.  Denies saddle anesthesia, numbness or weakness in his lower extremities, urinary or bowel changes.   Fall       Home Medications Prior to Admission medications   Medication Sig Start Date End Date Taking? Authorizing Provider  acetaminophen (TYLENOL) 325 MG tablet Take 325-650 mg by mouth every 6 (six) hours as needed for mild pain (for pain).    [provider]  aspirin EC 81 MG EC tablet Take 1 tablet (81 mg total) by mouth daily. 08/05/19   Furth, Cadence H, PA-C  carvedilol (COREG) 12.5 MG tablet Take 1 tablet (12.5 mg total) by mouth 2 (two) times daily with a meal. PLEASE SCHEDULE APPOINTMENT FOR FURTHER REFILLS 2ND ATTEMPT 04/15/22   Parke Poisson, MD  clopidogrel (PLAVIX) 75 MG tablet Take 1 tablet (75 mg total) by mouth daily. 04/15/22   Parke Poisson, MD  icosapent Ethyl (VASCEPA) 1 g capsule Take 2 capsules (2 g total) by mouth 2 (two) times daily. 05/21/21   Parke Poisson, MD  isosorbide mononitrate (IMDUR) 60 MG 24 hr tablet Take 1 tablet (60 mg total) by mouth daily. 06/18/21 01/01/22  Parke Poisson, MD  nitroGLYCERIN (NITROSTAT)  0.4 MG SL tablet Place 1 tablet (0.4 mg total) under the tongue every 5 (five) minutes x 3 doses as needed for chest pain. 11/15/20   Leone Brand, NP  Vitamin D, Ergocalciferol, (DRISDOL) 1.25 MG (50000 UNIT) CAPS capsule Take 1 capsule (50,000 Units total) by mouth every 7 (seven) days. 01/07/22   Mayers, Cari S, PA-C      Allergies    Rosuvastatin    Review of Systems   See HPI for pertinent positives  Physical Exam Updated Vital Signs BP (!) 158/98 (BP Location: Right Arm)   Pulse 96   Temp 98.7 F (37.1 C)   Resp 16   Ht 5\' 10"  (1.778 m)   Wt 86.2 kg   SpO2 100%   BMI 27.26 kg/m  Physical Exam Vitals and nursing note reviewed.  HENT:     Head: Normocephalic and atraumatic.     Mouth/Throat:     Mouth: Mucous membranes are moist.  Eyes:     General:        Right eye: No discharge.        Left eye: No discharge.     Conjunctiva/sclera: Conjunctivae normal.  Cardiovascular:     Rate and Rhythm: Normal rate and regular rhythm.     Pulses: Normal pulses.     Heart sounds: Normal heart sounds.  Pulmonary:  Effort: Pulmonary effort is normal.     Breath sounds: Normal breath sounds.  Abdominal:     General: Abdomen is flat.     Palpations: Abdomen is soft.  Musculoskeletal:     Right knee: No swelling, deformity, effusion or erythema. Normal range of motion. Tenderness present.     Right ankle: Swelling present.       Legs:     Comments: Some swelling about the right ankle joint.  Not tender to touch.  Pedal pulses are 2+ bilaterally.  Range of motion of the ankle and knee are normal.  Able to ambulate and bear weight without issue.  No calf tenderness.  Skin:    General: Skin is warm and dry.  Neurological:     General: No focal deficit present.  Psychiatric:        Mood and Affect: Mood normal.     ED Results / Procedures / Treatments   Labs (all labs ordered are listed, but only abnormal results are displayed) Labs Reviewed - No data to  display  EKG None  Radiology DG Knee Complete 4 Views Right  Result Date: 06/23/2023 CLINICAL DATA:  Pain after fall. EXAM: RIGHT KNEE - COMPLETE 4+ VIEW COMPARISON:  None Available. FINDINGS: No evidence of fracture, dislocation, or joint effusion. Normal alignment and joint spaces. Trace peripheral spurring. Soft tissues are unremarkable. IMPRESSION: No fracture or subluxation of the right knee. Electronically Signed   By: Narda Rutherford M.D.   On: 06/23/2023 18:24   DG Ankle Complete Right  Result Date: 06/23/2023 CLINICAL DATA:  Pain after fall. EXAM: RIGHT ANKLE - COMPLETE 3+ VIEW COMPARISON:  None Available. FINDINGS: There is no evidence of fracture, dislocation, or joint effusion. Minimal tibial talar spurring. Ankle mortise is preserved. Intact talar dome. Soft tissues are unremarkable. IMPRESSION: No fracture or subluxation of the right ankle. Electronically Signed   By: Narda Rutherford M.D.   On: 06/23/2023 18:23    Procedures Procedures    Medications Ordered in ED Medications  acetaminophen (TYLENOL) tablet 1,000 mg (1,000 mg Oral Given 06/23/23 1530)    ED Course/ Medical Decision Making/ A&P                                 Medical Decision Making Amount and/or Complexity of Data Reviewed Radiology: ordered.  Risk OTC drugs.   62 year old male presenting for mechanical fall that occurred 3 days ago.  Patient endorsed persistent right knee pain and ankle swelling.  Exam notable for tenderness about the right knee just above the knee joint about the medial aspect.  There was some swelling on both the lateral and medial aspects of the right ankle joint as well.  DDx includes fracture, dislocation, effusion, DVT, ankle sprain, other.  X-rays were unremarkable.  Suspect MSK injury.  Also could have an ankle sprain but he stated the swelling is improved overall and has no pain in ankle at this time therefore felt it was not warranted to treat any further.  Advised to  treat conservatively at home.  Advised to follow-up with PCP.  Initially mildly tachycardic and hypertensive.  Both heart rate and blood pressure improved during encounter without intervention.  Vitals stable.  Discharged.        Final Clinical Impression(s) / ED Diagnoses Final diagnoses:  Fall, initial encounter  Acute pain of right knee  Right ankle swelling    Rx / DC Orders  ED Discharge Orders     None         Gareth Eagle, PA-C 06/23/23 1834    Lonell Grandchild, MD 06/24/23 [phone]

## 2023-06-23 NOTE — Discharge Instructions (Signed)
Evaluation today was overall reassuring.  Your x-rays were negative.  I do suspect you have some muscle inflammation secondary to your fall.  Recommend that you treat conservatively.  He can apply ice and areas that hurt or are swollen.  You can also take Tylenol and ibuprofen as needed.  If your symptoms get worse please return to the emergency department further evaluation otherwise recommend follow-up with your PCP.

## 2023-06-23 NOTE — ED Triage Notes (Signed)
C/O fall 3 days ago, fell into a ditch and "went boom" "hit a little bit of everything"; c/o right sided leg pain.

## 2023-06-23 NOTE — ED Notes (Signed)
Into speak to pt several times , awaiting xrays to be read , pt  is very frustrated abot the wait

## 2023-11-24 ENCOUNTER — Other Ambulatory Visit (HOSPITAL_BASED_OUTPATIENT_CLINIC_OR_DEPARTMENT_OTHER): Payer: Self-pay

## 2023-11-24 ENCOUNTER — Other Ambulatory Visit: Payer: Self-pay

## 2023-11-24 ENCOUNTER — Encounter (HOSPITAL_BASED_OUTPATIENT_CLINIC_OR_DEPARTMENT_OTHER): Payer: Self-pay | Admitting: Emergency Medicine

## 2023-11-24 ENCOUNTER — Emergency Department (HOSPITAL_BASED_OUTPATIENT_CLINIC_OR_DEPARTMENT_OTHER)
Admission: EM | Admit: 2023-11-24 | Discharge: 2023-11-24 | Disposition: A | Discharge status: Home / Self Care | Attending: Emergency Medicine | Admitting: Emergency Medicine

## 2023-11-24 DIAGNOSIS — L03115 Cellulitis of right lower limb: Secondary | ICD-10-CM | POA: Insufficient documentation

## 2023-11-24 DIAGNOSIS — I2510 Atherosclerotic heart disease of native coronary artery without angina pectoris: Secondary | ICD-10-CM | POA: Diagnosis not present

## 2023-11-24 DIAGNOSIS — Z7902 Long term (current) use of antithrombotics/antiplatelets: Secondary | ICD-10-CM | POA: Insufficient documentation

## 2023-11-24 DIAGNOSIS — R21 Rash and other nonspecific skin eruption: Secondary | ICD-10-CM | POA: Diagnosis present

## 2023-11-24 DIAGNOSIS — B353 Tinea pedis: Secondary | ICD-10-CM | POA: Insufficient documentation

## 2023-11-24 DIAGNOSIS — Z7982 Long term (current) use of aspirin: Secondary | ICD-10-CM | POA: Diagnosis not present

## 2023-11-24 MED ORDER — CHLORDIAZEPOXIDE HCL 25 MG PO CAPS
ORAL_CAPSULE | ORAL | 0 refills | Status: DC
  Filled 2023-11-24: qty 10, 3d supply, fill #0

## 2023-11-24 MED ORDER — TERBINAFINE HCL 1 % EX CREA
1.0000 | TOPICAL_CREAM | Freq: Two times a day (BID) | CUTANEOUS | 0 refills | Status: DC
  Filled 2023-11-24: qty 15, 8d supply, fill #0
  Filled 2023-11-27: qty 15, 8d supply, fill #1

## 2023-11-24 MED ORDER — CEPHALEXIN 500 MG PO CAPS
500.0000 mg | ORAL_CAPSULE | Freq: Four times a day (QID) | ORAL | 0 refills | Status: DC
  Filled 2023-11-24: qty 20, 5d supply, fill #0

## 2023-11-24 MED ORDER — CARVEDILOL 12.5 MG PO TABS
12.5000 mg | ORAL_TABLET | Freq: Two times a day (BID) | ORAL | 1 refills | Status: DC
  Filled 2023-11-24: qty 30, 15d supply, fill #0

## 2023-11-24 MED ORDER — HYDROCORTISONE 1 % EX CREA
TOPICAL_CREAM | CUTANEOUS | 0 refills | Status: DC
  Filled 2023-11-24: qty 28.35, 30d supply, fill #0

## 2023-11-24 NOTE — Discharge Instructions (Addendum)
 Your symptoms could be due to the Enea pedis which is clinically early known as athlete's foot.  Continue to treat topically with an antifungal.  There could be a superimposed underlying cellulitis which we will treat with an antibiotic called Keflex.  Additionally, your scaling rash on your abdomen in addition to your foot rash could be due to undiagnosed psoriasis for which it is recommended that you follow-up with a dermatologist for further evaluation.  An additional possibility is that this is bad eczema.  Regarding your desire to quit drinking, a Librium taper has been prescribed.  Please do not operate heavy machinery or drink alcohol while taking this medication.

## 2023-11-24 NOTE — ED Triage Notes (Signed)
 Pt caox4 c/o rash and peeling skin on feet bilateral, lower abd and groin worsening x3-4 wks. Pt reports PMH eczema but states this is much worse especially when walking and that feet have been swelling. Pt has not seen PCP or taken Rx meds in approx 8-10 mo.

## 2023-11-24 NOTE — ED Provider Notes (Signed)
 [facility] Gutierrez Provider Note   CSN: [id number] Arrival date & time: 11/24/23  0740     History  Chief Complaint  Patient presents with   Rash    Barry Gutierrez is a 63 y.o. male.   Rash    63 year old male with medical history significant for CAD/MI status post stent to the RCA presenting to the emergency department with a bilateral lower foot rash and a rash to the abdomen.  The patient states that he has no known history of a diagnosis of psoriasis.  He presents with several weeks of a plaque-like lesion to his abdomen in addition to lesions along his bilateral feet.  He states that for the past 3 to 4 weeks he has been thinking he had been having a flare of eczema and had been utilizing moisturizing cream in addition to Lotrimin cream without success.  He endorses worsening swelling and redness and pain in his feet and has been having difficulty with ambulation.  He does not follow-up frequently with doctors and is requesting a refill of his home Coreg.  He denies any fevers or chills.  Home Medications Prior to Admission medications   Medication Sig Start Date End Date Taking? Authorizing Provider  cephALEXin (KEFLEX) 500 MG capsule Take 1 capsule (500 mg total) by mouth 4 (four) times daily. 11/24/23  Yes Ernie Avena, MD  chlordiazePOXIDE (LIBRIUM) 25 MG capsule Take 2 capsules by mouth 3 times daily for 1 day, then 25-50mg  by mouth twice daily for 1 day, then 25-50mg  by mouth once daily for 1 day 11/24/23  Yes Ernie Avena, MD  hydrocortisone cream 1 % Apply to affected area 2 times daily 11/24/23  Yes Ernie Avena, MD  terbinafine (ATHLETES FOOT, TERBINAFINE,) 1 % cream Apply 1 Application topically 2 (two) times daily. 11/24/23  Yes Ernie Avena, MD  acetaminophen (TYLENOL) 325 MG tablet Take 325-650 mg by mouth every 6 (six) hours as needed for mild pain (for pain).    [provider]  aspirin EC 81 MG EC tablet Take 1  tablet (81 mg total) by mouth daily. 08/05/19   Furth, Cadence H, PA-C  carvedilol (COREG) 12.5 MG tablet Take 1 tablet (12.5 mg total) by mouth 2 (two) times daily with a meal. PLEASE SCHEDULE APPOINTMENT FOR FURTHER REFILLS 2ND ATTEMPT 11/24/23   Ernie Avena, MD  clopidogrel (PLAVIX) 75 MG tablet Take 1 tablet (75 mg total) by mouth daily. 04/15/22   Parke Poisson, MD  icosapent Ethyl (VASCEPA) 1 g capsule Take 2 capsules (2 g total) by mouth 2 (two) times daily. 05/21/21   Parke Poisson, MD  isosorbide mononitrate (IMDUR) 60 MG 24 hr tablet Take 1 tablet (60 mg total) by mouth daily. 06/18/21 01/01/22  Parke Poisson, MD  nitroGLYCERIN (NITROSTAT) 0.4 MG SL tablet Place 1 tablet (0.4 mg total) under the tongue every 5 (five) minutes x 3 doses as needed for chest pain. 11/15/20   Leone Brand, NP  Vitamin D, Ergocalciferol, (DRISDOL) 1.25 MG (50000 UNIT) CAPS capsule Take 1 capsule (50,000 Units total) by mouth every 7 (seven) days. 01/07/22   Mayers, Cari S, PA-C      Allergies    Rosuvastatin    Review of Systems   Review of Systems  Skin:  Positive for rash.  All other systems reviewed and are negative.   Physical Exam Updated Vital Signs BP (!) 150/92   Pulse 93   Temp 98.3 F (  36.8 C) (Oral)   Resp 16   Ht 5\' 11"  (1.803 m)   Wt 81.6 kg   SpO2 100%   BMI 25.10 kg/m  Physical Exam Vitals and nursing note reviewed.  Constitutional:      General: He is not in acute distress. HENT:     Head: Normocephalic and atraumatic.  Eyes:     Conjunctiva/sclera: Conjunctivae normal.     Pupils: Pupils are equal, round, and reactive to light.  Cardiovascular:     Rate and Rhythm: Normal rate and regular rhythm.  Pulmonary:     Effort: Pulmonary effort is normal. No respiratory distress.  Abdominal:     General: There is no distension.     Tenderness: There is no guarding.  Musculoskeletal:        General: No deformity or signs of injury.     Cervical back: Neck  supple.     Comments: 2+ DP pulses bilaterally  Skin:    Findings: Rash present.     Comments: Plaque like erythematous rash along the patient's abdomen with scale present.  Erythematous rash with skin desquamation present along the patient's feet, few petechiae present  Neurological:     General: No focal deficit present.     Mental Status: He is alert. Mental status is at baseline.       ED Results / Procedures / Treatments   Labs (all labs ordered are listed, but only abnormal results are displayed) Labs Reviewed  KOH PREP    EKG None  Radiology No results found.  Procedures Procedures    Medications Ordered in ED Medications - No data to display  ED Course/ Medical Decision Making/ A&P                                 Medical Decision Making Amount and/or Complexity of Data Reviewed Labs: ordered.  Risk OTC drugs. Prescription drug management.   63 year old male with medical history significant for CAD/MI status post stent to the RCA presenting to the emergency department with a bilateral lower foot rash and a rash to the abdomen.  The patient states that he has no known history of a diagnosis of psoriasis.  He presents with several weeks of a plaque-like lesion to his abdomen in addition to lesions along his bilateral feet.  He states that for the past 3 to 4 weeks he has been thinking he had been having a flare of eczema and had been utilizing moisturizing cream in addition to Lotrimin cream without success.  He endorses worsening swelling and redness and pain in his feet and has been having difficulty with ambulation.  He does not follow-up frequently with doctors and is requesting a refill of his home Coreg.  He denies any fevers or chills.  On arrival, the patient was vitally stable. Physical exam revealed a plaque like erythematous rash along the patient's abdomen with scale present.  Erythematous rash with skin desquamation present along the patient's feet,  few petechiae present.  Given the patient's plaque-like lesion along his abdomen, considered undiagnosed psoriasis.  A referral for outpatient follow-up with dermatology was recommended and was encouraged to the patient schedule follow-up with a dermatologist for further evaluation.  Regarding the patient's feet rash, considered eczema, superimposed cellulitis with worsening erythema and pain, considered dermatophyte infection, tinea pedis.  Will treat for both developing cellulitis in addition to potential tinea infection.  Patient also states that  he is an alcoholic.  His CIWA score is currently at 0.  Requesting detox, will prescribe Librium taper.  The patient's home Coreg was reordered.  He was advised Keflex, turbinafine, hydrocortisone cream and outpatient dermatology follow-up.  Differential Diagnoses: There are no red flag symptoms such as fever, mucosal involvement, hypotension, toxic appearance, severe pain, immunosuppression, or a concerning medication being used. There are no physical exam findings or symptoms on ROS concerning for erythema multiforme, SJS/TEN, Lyme disease, necrotizing fasciitis, purupra fulminans, angioedema, anaphylaxis, meningococcemia, DRESS, or RMSF. I do not think that the patient is septic.  I believe the patient is safe for discharge home. The patient feels safe with this plan.   Final Clinical Impression(s) / ED Diagnoses Final diagnoses:  Tinea pedis of both feet  Rash  Cellulitis of right foot    Rx / DC Orders ED Discharge Orders          Ordered    carvedilol (COREG) 12.5 MG tablet  2 times daily with meals       Note to Pharmacy: PLEASE SCHEDULE AN APPT FOR FUTURE REFILLS   11/24/23 0847    Ambulatory referral to Dermatology        11/24/23 0849    terbinafine (ATHLETES FOOT, TERBINAFINE,) 1 % cream  2 times daily        11/24/23 0851    hydrocortisone cream 1 %        11/24/23 0851    cephALEXin (KEFLEX) 500 MG capsule  4 times daily         11/24/23 0851    chlordiazePOXIDE (LIBRIUM) 25 MG capsule        11/24/23 1610              Ernie Avena, MD 11/24/23 1411

## 2023-11-26 LAB — KOH PREP: KOH Prep: NONE SEEN

## 2023-11-27 ENCOUNTER — Other Ambulatory Visit (HOSPITAL_BASED_OUTPATIENT_CLINIC_OR_DEPARTMENT_OTHER): Payer: Self-pay

## 2023-12-26 ENCOUNTER — Telehealth: Payer: Self-pay | Admitting: Internal Medicine

## 2023-12-26 MED ORDER — CARVEDILOL 12.5 MG PO TABS: 12.5000 mg | ORAL_TABLET | Freq: Two times a day (BID) | ORAL | 1 refills | Status: DC

## 2023-12-26 NOTE — Telephone Encounter (Signed)
 Pt's medication was sent to pt's pharmacy as requested. Confirmation received.

## 2023-12-26 NOTE — Telephone Encounter (Signed)
*  STAT* If patient is at the pharmacy, call can be transferred to refill team.   1. Which medications need to be refilled? (please list name of each medication and dose if known) carvedilol  (COREG ) 12.5 MG tablet   2. Which pharmacy/location (including street and city if local pharmacy) is medication to be sent to? [id number] [place],  - [address] AT [facility] OF [address] Phone: [phone]  Fax: [phone]     3. Do they need a 30 day or 90 day supply? Pt has made next avail appt for 6/27 please refill til then. Pt has only one pill left

## 2024-02-02 ENCOUNTER — Telehealth: Payer: Self-pay | Admitting: Internal Medicine

## 2024-02-02 MED ORDER — CARVEDILOL 12.5 MG PO TABS: 12.5000 mg | ORAL_TABLET | Freq: Two times a day (BID) | ORAL | 0 refills | Status: DC

## 2024-02-02 NOTE — Telephone Encounter (Signed)
 Pt's medication was sent to pt's pharmacy as requested. Confirmation received.

## 2024-02-02 NOTE — Telephone Encounter (Signed)
*  STAT* If patient is at the pharmacy, call can be transferred to refill team.   1. Which medications need to be refilled? (please list name of each medication and dose if known) carvedilol  (COREG ) 12.5 MG tablet     4. Which pharmacy/location (including street and city if local pharmacy) is medication to be sent to?  [id number] - [place], [place] - [address] AT [facility] OF [address]     5. Do they need a 30 day or 90 day supply? 30    Pt scheduled for 6/27

## 2024-02-05 ENCOUNTER — Other Ambulatory Visit: Payer: Self-pay

## 2024-02-05 ENCOUNTER — Other Ambulatory Visit: Payer: Self-pay | Admitting: Internal Medicine

## 2024-02-05 MED ORDER — CARVEDILOL 12.5 MG PO TABS: 12.5000 mg | ORAL_TABLET | Freq: Two times a day (BID) | ORAL | 0 refills | Status: DC

## 2024-02-25 ENCOUNTER — Encounter (HOSPITAL_BASED_OUTPATIENT_CLINIC_OR_DEPARTMENT_OTHER): Payer: Self-pay | Admitting: *Deleted

## 2024-02-26 NOTE — Progress Notes (Signed)
 OFFICE NOTE:    Date:  02/27/2024  ID:  Barry Gutierrez, DOB Apr 18, 1961, MRN [id number] PCP: Pcp, No  [facility] Providers Cardiologist:  Soyla DELENA Merck, MD       Patient Profile:  Coronary artery disease  NSTEMI 08/2019-mid RCA occluded with left-to-right collaterals >>[facility]  >> s/p 3 x 8 mm DES to pRCA, 2.75 x 38 mm DES to mRCA in 10/2020 LHC 11/15/2020: LCx ostial 40; RCA mid 90, proximal 70 Intol of Ticagrelor  (dyspnea) TTE 05/21/2021: EF 55-60, no RWMA, inferior/inferolateral HK, GR 1 DD, normal RVSF, trivial MR Hypertension  Hyperlipidemia Hx of ETOH abuse        Discussed the use of AI scribe software for clinical note transcription with the patient, who gave verbal consent to proceed. History of Present Illness Barry Gutierrez is a 63 y.o. male who returns for follow up of CAD.  He was last seen by Dr. Merck in 06/2021.    He is here alone. He has experienced intermittent chest tightness and pain since 2020, often triggered by emotional stress and physical exertion, and sometimes waking him at night. He reports lightheadedness, dizziness, and shortness of breath, especially when climbing stairs or hills. These symptoms have persisted for over a year but have improved with the resumption of carvedilol  recently. He has noted palpitations with his symptoms at times. He is intolerant to statins due to gastrointestinal side effects and is not currently on cholesterol-lowering medication.  He has reduced alcohol intake, now only consuming beer, and has quit smoking, using a nicotine  inhaler. He denies fever, vomiting, diarrhea, leg swelling, frequent palpitations, or changes in breathing while lying flat. He occasionally experiences cough and chest congestion without wheezing.    ROS-See HPI    Studies Reviewed:  EKG Interpretation Date/Time:  Friday February 27 2024 08:06:23 EDT Ventricular Rate:  75 PR Interval:  170 QRS Duration:  86 QT Interval:  398 QTC  Calculation: 444 R Axis:   25  Text Interpretation: Normal sinus rhythm Normal ECG No significant change since last tracing Confirmed by Lelon Hamilton [phone]) on 02/27/2024 8:10:19 AM   Results LABS LDL: 104 mg/dL (94/96/7976) ALT: 25 U/L (01/02/2022) Creatinine: 0.91 mg/dL (97/91/7976)  Risk Assessment/Calculations:          Physical Exam:  VS:  BP 110/80   Pulse 75   Ht 5' 11 (1.803 m)   Wt 201 lb 9.6 oz (91.4 kg)   SpO2 94%   BMI 28.12 kg/m        Wt Readings from Last 3 Encounters:  02/27/24 201 lb 9.6 oz (91.4 kg)  11/24/23 180 lb (81.6 kg)  06/23/23 190 lb (86.2 kg)    Constitutional:      Appearance: Healthy appearance. Not in distress.  Neck:     Vascular: No carotid bruit. JVD normal.  Pulmonary:     Breath sounds: Normal breath sounds. No wheezing. No rales.  Cardiovascular:     Normal rate. Regular rhythm.     Murmurs: There is no murmur.  Edema:    Peripheral edema absent.  Abdominal:     Palpations: Abdomen is soft.        Assessment and Plan:    Assessment & Plan Coronary artery disease involving native coronary artery of native heart with angina pectoris (HCC) Hx of NSTEMI in 2020 tx medically due to CTO of RCA w collaterals. Pt ultimately underwent PCI with DES x 2 to RCA in 2022 due  to unstable angina. EF is normal. Continues to experience intermittent chest tightness, lightheadedness, shortness of breath, and palpitations, sometimes associated with emotional stress and physical exertion. Symptoms have improved since restarting carvedilol . EKG is normal. Differential includes rhythm issues versus new blockage.  He does not take PDE-5 inhibitors. - Continue aspirin  81 mg daily - Continue carvedilol  12.5 mg twice daily - Restart Imdur  30 mg daily - Order exercise nuclear stress test - Order echocardiogram - Order 14-day Zio monitor to assess for arrhythmia - Check comprehensive metabolic panel, lipid panel, and TSH - Follow up 2-3 mos Mixed  hyperlipidemia Last LDL in 2023 was above goal. Goal is < 55. Intolerant to statins due to GI issues with atorvastatin  and rosuvastatin . Ezetimibe may not be tolerated due to similar side effect profile.   - Refer to PharmD clinic for PCSK9 inhibitor Essential hypertension The patient's blood pressure is controlled on his current regimen.  Continue current therapy.   Palpitations Obtain 14 day Zio XT. CMET, TSH today. Continue Carvedilol  12.5 mg twice daily.  Alcohol abuse Has significantly reduced alcohol intake, now only consuming beer. Reports feeling better since reducing alcohol consumption. - Encourage complete abstinence from alcohol - Discuss potential benefits of support groups like AA      Informed Consent   Shared Decision Making/Informed Consent The risks [chest pain, shortness of breath, cardiac arrhythmias, dizziness, blood pressure fluctuations, myocardial infarction, stroke/transient ischemic attack, nausea, vomiting, allergic reaction, radiation exposure, metallic taste sensation and life-threatening complications (estimated to be 1 in 10,000)], benefits (risk stratification, diagnosing coronary artery disease, treatment guidance) and alternatives of a nuclear stress test were discussed in detail with Mr. Capizzi and he agrees to proceed.     Dispo:  Return in about 3 months (around 05/29/2024) for Routine Follow Up w/ Dr. Loni, or Glendia Ferrier, PA-C.  Signed, Glendia Ferrier, PA-C

## 2024-02-27 ENCOUNTER — Ambulatory Visit (INDEPENDENT_AMBULATORY_CARE_PROVIDER_SITE_OTHER)

## 2024-02-27 ENCOUNTER — Ambulatory Visit: Attending: Physician Assistant | Admitting: Physician Assistant

## 2024-02-27 ENCOUNTER — Other Ambulatory Visit: Payer: Self-pay | Admitting: Physician Assistant

## 2024-02-27 ENCOUNTER — Encounter: Payer: Self-pay | Admitting: Physician Assistant

## 2024-02-27 VITALS — BP 110/80 | HR 75 | Ht 71.0 in | Wt 201.6 lb

## 2024-02-27 DIAGNOSIS — F1010 Alcohol abuse, uncomplicated: Secondary | ICD-10-CM

## 2024-02-27 DIAGNOSIS — R002 Palpitations: Secondary | ICD-10-CM | POA: Diagnosis not present

## 2024-02-27 DIAGNOSIS — I25119 Atherosclerotic heart disease of native coronary artery with unspecified angina pectoris: Secondary | ICD-10-CM

## 2024-02-27 DIAGNOSIS — E782 Mixed hyperlipidemia: Secondary | ICD-10-CM

## 2024-02-27 DIAGNOSIS — I10 Essential (primary) hypertension: Secondary | ICD-10-CM

## 2024-02-27 DIAGNOSIS — I2510 Atherosclerotic heart disease of native coronary artery without angina pectoris: Secondary | ICD-10-CM

## 2024-02-27 MED ORDER — NITROGLYCERIN 0.4 MG SL SUBL: 0.4000 mg | SUBLINGUAL_TABLET | SUBLINGUAL | 11 refills | Status: AC | PRN

## 2024-02-27 MED ORDER — ISOSORBIDE MONONITRATE ER 30 MG PO TB24: 30.0000 mg | ORAL_TABLET | Freq: Every day | ORAL | 3 refills | Status: DC

## 2024-02-27 MED ORDER — CARVEDILOL 12.5 MG PO TABS: 12.5000 mg | ORAL_TABLET | Freq: Two times a day (BID) | ORAL | 3 refills | Status: AC

## 2024-02-27 NOTE — Patient Instructions (Addendum)
 Medication Instructions:  Your physician has recommended you make the following change in your medication:   RESTART Imdur  30 mg taking 1 daily  *If you need a refill on your cardiac medications before your next appointment, please call your pharmacy*  Lab Work: TODAY:  CMET, LIPID, & TSH  If you have labs (blood work) drawn today and your tests are completely normal, you will receive your results only by: MyChart Message (if you have MyChart) OR A paper copy in the mail If you have any lab test that is abnormal or we need to change your treatment, we will call you to review the results.  Testing/Procedures: Your physician has requested that you have an echocardiogram. Echocardiography is a painless test that uses sound waves to create images of your heart. It provides your doctor with information about the size and shape of your heart and how well your heart's chambers and valves are working. This procedure takes approximately one hour. There are no restrictions for this procedure. Please do NOT wear cologne, perfume, aftershave, or lotions (deodorant is allowed). Please arrive 15 minutes prior to your appointment time.  Please note: We ask at that you not bring children with you during ultrasound (echo/ vascular) testing. Due to room size and safety concerns, children are not allowed in the ultrasound rooms during exams. Our front office staff cannot provide observation of children in our lobby area while testing is being conducted. An adult accompanying a patient to their appointment will only be allowed in the ultrasound room at the discretion of the ultrasound technician under special circumstances. We apologize for any inconvenience.   Your physician has requested that you have en exercise stress myoview. For further information please visit [email or web address] Please follow instruction sheet, BELOW:    You are scheduled for a Myocardial Perfusion Study. Please arrive 15 minutes  prior to your appointment time for registration and insurance purposes.  The test will take approximately 3-4 hours to complete; you may bring reading material.  If someone comes with you to your appointment, they will need to remain in the main lobby due to limited space in the testing area.  If you are pregnant or breastfeeding, please notify the nuclear lab prior to your appointment.  HOW TO PREPARE FOR YOUR TEST:  DO NOT eat or drink 3 hours prior to your test, except you may have water DO NOT consume products containing caffeine (regular or decaffeinated) 12 hours prior to your test (ex: coffee, chocolate, sodas, tea.) DO bring a list of your current medications with you.  If not listed below, you may take your medications as normal. DO NOT TAKE CARVEDILOL   for 24 HOURS prior to the test.  Bring the medication to your appointment as you may be required to take it once the test is complete. DO wear comfortable clothes (no dresses or overalls) and walking shoes, tennis shoes preferred (No heels or open toe shoes are allowed) DO NOT wear cologne, perfume, aftershave, or lotions (deodorant is allowed). If these instructions are not followed, your test will have to be rescheduled.   Please report to [address] [place], [place] [address].  If you have questions or concerns about your appointment, you can call the Nuclear Lab [phone].  If you cannot keep your appointment, please provide 24 hours notification to the Nuclear Lab, to avoid a possible $50 charge to your account.    ZIO XT- Long Term Monitor Instructions  Your physician has requested you wear a ZIO  patch monitor for 14 days.  This is a single patch monitor. [employer] supplies one patch monitor per enrollment. Additional stickers are not available. Please do not apply patch if you will be having a Nuclear Stress Test,  Echocardiogram, Cardiac CT, MRI, or Chest Xray during the period you would be wearing the  monitor. The  patch cannot be worn during these tests. You cannot remove and re-apply the  ZIO XT patch monitor.  Your ZIO patch monitor will be mailed 3 day USPS to your address on file. It may take 3-5 days  to receive your monitor after you have been enrolled.  Once you have received your monitor, please review the enclosed instructions. Your monitor  has already been registered assigning a specific monitor serial # to you.  Billing and Patient Assistance Program Information  We have supplied [employer] with any of your insurance information on file for billing purposes. [employer] offers a sliding scale Patient Assistance Program for patients that do not have  insurance, or whose insurance does not completely cover the cost of the ZIO monitor.  You must apply for the Patient Assistance Program to qualify for this discounted rate.  To apply, please call [employer] at [phone], select option 4, select option 2, ask to apply for  Patient Assistance Program. Meredeth will ask your household income, and how many people  are in your household. They will quote your out-of-pocket cost based on that information.  [employer] will also be able to set up a 13-month, interest-free payment plan if needed.  Applying the monitor   Shave hair from upper left chest.  Hold abrader disc by orange tab. Rub abrader in 40 strokes over the upper left chest as  indicated in your monitor instructions.  Clean area with 4 enclosed alcohol pads. Let dry.  Apply patch as indicated in monitor instructions. Patch will be placed under collarbone on left  side of chest with arrow pointing upward.  Rub patch adhesive wings for 2 minutes. Remove white label marked 1. Remove the white  label marked 2. Rub patch adhesive wings for 2 additional minutes.  While looking in a mirror, press and release button in center of patch. A small green light will  flash 3-4 times. This will be your only indicator that the monitor has been turned on.  Do  not shower for the first 24 hours. You may shower after the first 24 hours.  Press the button if you feel a symptom. You will hear a small click. Record Date, Time and  Symptom in the Patient Logbook.  When you are ready to remove the patch, follow instructions on the last 2 pages of Patient  Logbook. Stick patch monitor onto the last page of Patient Logbook.  Place Patient Logbook in the blue and white box. Use locking tab on box and tape box closed  securely. The blue and white box has prepaid postage on it. Please place it in the mailbox as  soon as possible. Your physician should have your test results approximately 7 days after the  monitor has been mailed back to [facility].  Call [facility] Customer Care at [phone] if you have questions regarding  your ZIO XT patch monitor. Call them immediately if you see an orange light blinking on your  monitor.  If your monitor falls off in less than 4 days, contact our Monitor department at [phone].  If your monitor becomes loose or falls off after 4 days call [employer] at [phone] for  suggestions on securing your monitor   You have been referred to Lipid Clinic for Cholesterol management  Follow-Up: At [facility], you and your health needs are our priority.  As part of our continuing mission to provide you with exceptional heart care, our providers are all part of one team.  This team includes your primary Cardiologist (physician) and Advanced Practice Providers or APPs (Physician Assistants and Nurse Practitioners) who all work together to provide you with the care you need, when you need it.  Your next appointment:   3 month(s)  Provider:   Soyla DELENA Merck, MD or Glendia Ferrier, PA-C          We recommend signing up for the patient portal called MyChart.  Sign up information is provided on this After Visit Summary.  MyChart is used to connect with patients for Virtual Visits (Telemedicine).  Patients are  able to view lab/test results, encounter notes, upcoming appointments, etc.  Non-urgent messages can be sent to your provider as well.   To learn more about what you can do with MyChart, go to [email or web address].   Other Instructions

## 2024-02-27 NOTE — Assessment & Plan Note (Addendum)
 Hx of NSTEMI in 2020 tx medically due to CTO of RCA w collaterals. Pt ultimately underwent PCI with DES x 2 to RCA in 2022 due to unstable angina. EF is normal. Continues to experience intermittent chest tightness, lightheadedness, shortness of breath, and palpitations, sometimes associated with emotional stress and physical exertion. Symptoms have improved since restarting carvedilol . EKG is normal. Differential includes rhythm issues versus new blockage.  He does not take PDE-5 inhibitors. - Continue aspirin  81 mg daily - Continue carvedilol  12.5 mg twice daily - Restart Imdur  30 mg daily - Order exercise nuclear stress test - Order echocardiogram - Order 14-day Zio monitor to assess for arrhythmia - Check comprehensive metabolic panel, lipid panel, and TSH - Follow up 2-3 mos

## 2024-02-27 NOTE — Progress Notes (Unsigned)
Enrolled patient for a 14 day Zio XT monitor to be mailed to patients home  Barry Gutierrez to read

## 2024-02-27 NOTE — Assessment & Plan Note (Addendum)
The patient's blood pressure is controlled on his current regimen.  Continue current therapy.  

## 2024-02-27 NOTE — Assessment & Plan Note (Signed)
 Has significantly reduced alcohol intake, now only consuming beer. Reports feeling better since reducing alcohol consumption. - Encourage complete abstinence from alcohol - Discuss potential benefits of support groups like AA

## 2024-02-27 NOTE — Assessment & Plan Note (Addendum)
 Last LDL in 2023 was above goal. Goal is < 55. Intolerant to statins due to GI issues with atorvastatin  and rosuvastatin . Ezetimibe may not be tolerated due to similar side effect profile.   - Refer to PharmD clinic for PCSK9 inhibitor

## 2024-02-28 LAB — COMPREHENSIVE METABOLIC PANEL WITH GFR
ALT: 23 IU/L (ref 0–44)
AST: 25 IU/L (ref 0–40)
Albumin: 4.9 g/dL (ref 3.8–4.9)
Alkaline Phosphatase: 59 IU/L (ref 44–121)
BUN/Creatinine Ratio: 12 (ref 10–24)
BUN: 10 mg/dL (ref 8–27)
Bilirubin Total: 1.2 mg/dL (ref 0.0–1.2)
CO2: 22 mmol/L (ref 20–29)
Calcium: 10 mg/dL (ref 8.6–10.2)
Chloride: 98 mmol/L (ref 96–106)
Creatinine, Ser: 0.83 mg/dL (ref 0.76–1.27)
Globulin, Total: 2.6 g/dL (ref 1.5–4.5)
Glucose: 100 mg/dL — ABNORMAL HIGH (ref 70–99)
Potassium: 4.9 mmol/L (ref 3.5–5.2)
Sodium: 138 mmol/L (ref 134–144)
Total Protein: 7.5 g/dL (ref 6.0–8.5)
eGFR: 100 mL/min/{1.73_m2} (ref >59)

## 2024-02-28 LAB — LIPID PANEL
Chol/HDL Ratio: 3.7 ratio (ref 0.0–5.0)
Cholesterol, Total: 232 mg/dL — ABNORMAL HIGH (ref 100–199)
HDL: 62 mg/dL (ref >39)
LDL Chol Calc (NIH): 134 mg/dL — ABNORMAL HIGH (ref 0–99)
Triglycerides: 206 mg/dL — ABNORMAL HIGH (ref 0–149)
VLDL Cholesterol Cal: 36 mg/dL (ref 5–40)

## 2024-02-28 LAB — TSH: TSH: 2.13 u[IU]/mL (ref 0.450–4.500)

## 2024-02-29 ENCOUNTER — Ambulatory Visit: Payer: Self-pay | Admitting: Physician Assistant

## 2024-03-04 ENCOUNTER — Telehealth (HOSPITAL_COMMUNITY): Payer: Self-pay | Admitting: *Deleted

## 2024-03-04 NOTE — Telephone Encounter (Signed)
 Spoke to patient and he was given detailed instructions about his STRESS TEST 03/12/24 at 8:00. He was also reminded not to take his Carvedilol  for 24 hours before the TEST,

## 2024-03-11 ENCOUNTER — Telehealth (HOSPITAL_COMMUNITY): Payer: Self-pay | Admitting: *Deleted

## 2024-03-11 NOTE — Telephone Encounter (Signed)
 Left a reminder message on voicemail per patient request bout his STRESS TEST on 03/12/24 at 8:00. I also reminded him again not to take his Carvedilol .

## 2024-03-12 ENCOUNTER — Encounter (HOSPITAL_COMMUNITY): Payer: Self-pay | Admitting: Physician Assistant

## 2024-03-12 ENCOUNTER — Ambulatory Visit (HOSPITAL_COMMUNITY)
Admission: RE | Admit: 2024-03-12 | Source: Ambulatory Visit | Attending: Physician Assistant | Admitting: Physician Assistant

## 2024-03-19 ENCOUNTER — Other Ambulatory Visit: Payer: Self-pay | Admitting: Physician Assistant

## 2024-03-19 DIAGNOSIS — I10 Essential (primary) hypertension: Secondary | ICD-10-CM

## 2024-03-19 DIAGNOSIS — E782 Mixed hyperlipidemia: Secondary | ICD-10-CM

## 2024-03-19 DIAGNOSIS — I25119 Atherosclerotic heart disease of native coronary artery with unspecified angina pectoris: Secondary | ICD-10-CM

## 2024-04-01 ENCOUNTER — Telehealth (HOSPITAL_COMMUNITY): Payer: Self-pay | Admitting: *Deleted

## 2024-04-01 ENCOUNTER — Encounter (HOSPITAL_COMMUNITY): Payer: Self-pay | Admitting: *Deleted

## 2024-04-01 NOTE — Telephone Encounter (Signed)
 Instructions for STRESS TEST was sent via mail USPS.

## 2024-04-10 DIAGNOSIS — I10 Essential (primary) hypertension: Secondary | ICD-10-CM | POA: Diagnosis not present

## 2024-04-10 DIAGNOSIS — R002 Palpitations: Secondary | ICD-10-CM | POA: Diagnosis not present

## 2024-04-10 DIAGNOSIS — I25119 Atherosclerotic heart disease of native coronary artery with unspecified angina pectoris: Secondary | ICD-10-CM | POA: Diagnosis not present

## 2024-04-12 ENCOUNTER — Encounter: Payer: Self-pay | Admitting: Physician Assistant

## 2024-04-12 ENCOUNTER — Ambulatory Visit: Payer: Self-pay | Admitting: Physician Assistant

## 2024-04-12 DIAGNOSIS — R002 Palpitations: Secondary | ICD-10-CM | POA: Insufficient documentation

## 2024-04-14 ENCOUNTER — Encounter (HOSPITAL_COMMUNITY): Payer: Self-pay | Admitting: Physician Assistant

## 2024-04-14 DIAGNOSIS — R079 Chest pain, unspecified: Secondary | ICD-10-CM

## 2024-04-15 ENCOUNTER — Other Ambulatory Visit: Payer: Self-pay | Admitting: Physician Assistant

## 2024-04-15 DIAGNOSIS — I10 Essential (primary) hypertension: Secondary | ICD-10-CM

## 2024-04-15 DIAGNOSIS — E782 Mixed hyperlipidemia: Secondary | ICD-10-CM

## 2024-04-15 DIAGNOSIS — I25119 Atherosclerotic heart disease of native coronary artery with unspecified angina pectoris: Secondary | ICD-10-CM

## 2024-04-16 ENCOUNTER — Ambulatory Visit (INDEPENDENT_AMBULATORY_CARE_PROVIDER_SITE_OTHER): Admitting: Pharmacist

## 2024-04-16 ENCOUNTER — Other Ambulatory Visit (HOSPITAL_COMMUNITY): Payer: Self-pay

## 2024-04-16 ENCOUNTER — Ambulatory Visit (HOSPITAL_COMMUNITY)
Admission: RE | Admit: 2024-04-16 | Discharge: 2024-04-16 | Disposition: A | Discharge status: Home / Self Care | Source: Ambulatory Visit | Attending: Physician Assistant | Admitting: Physician Assistant

## 2024-04-16 ENCOUNTER — Encounter: Payer: Self-pay | Admitting: Pharmacist

## 2024-04-16 ENCOUNTER — Telehealth: Payer: Self-pay | Admitting: Pharmacist

## 2024-04-16 ENCOUNTER — Encounter (HOSPITAL_COMMUNITY)

## 2024-04-16 ENCOUNTER — Telehealth: Payer: Self-pay | Admitting: Pharmacy Technician

## 2024-04-16 ENCOUNTER — Ambulatory Visit (HOSPITAL_BASED_OUTPATIENT_CLINIC_OR_DEPARTMENT_OTHER)
Admission: RE | Admit: 2024-04-16 | Discharge: 2024-04-16 | Disposition: A | Discharge status: Home / Self Care | Source: Ambulatory Visit | Attending: Physician Assistant | Admitting: Physician Assistant

## 2024-04-16 DIAGNOSIS — E782 Mixed hyperlipidemia: Secondary | ICD-10-CM

## 2024-04-16 DIAGNOSIS — Z95820 Peripheral vascular angioplasty status with implants and grafts: Secondary | ICD-10-CM

## 2024-04-16 DIAGNOSIS — I10 Essential (primary) hypertension: Secondary | ICD-10-CM

## 2024-04-16 DIAGNOSIS — I25119 Atherosclerotic heart disease of native coronary artery with unspecified angina pectoris: Secondary | ICD-10-CM

## 2024-04-16 DIAGNOSIS — R002 Palpitations: Secondary | ICD-10-CM | POA: Insufficient documentation

## 2024-04-16 DIAGNOSIS — Z789 Other specified health status: Secondary | ICD-10-CM

## 2024-04-16 LAB — MYOCARDIAL PERFUSION IMAGING
Angina Index: 0
Duke Treadmill Score: 5
Estimated workload: 7
Exercise duration (min): 5 min
Exercise duration (sec): 0 s
LV dias vol: 105 mL (ref 62–150)
LV sys vol: 43 mL (ref 4.2–5.8)
MPHR: 159 {beats}/min
Nuc Stress EF: 59 %
Peak HR: 139 {beats}/min
Percent HR: 87 %
RPE: 18
Rest HR: 79 {beats}/min
Rest Nuclear Isotope Dose: 10.6 mCi
SDS: 4
SRS: 2
SSS: 6
ST Depression (mm): 0 mm
Stress Nuclear Isotope Dose: 31 mCi
TID: 1.22

## 2024-04-16 LAB — ECHOCARDIOGRAM COMPLETE
Area-P 1/2: 4.15 cm2
S' Lateral: 3.9 cm

## 2024-04-16 MED ORDER — ISOSORBIDE MONONITRATE ER 30 MG PO TB24
30.0000 mg | ORAL_TABLET | Freq: Every day | ORAL | 3 refills | Status: AC
  Filled 2024-04-16 – 2024-04-19 (×2): qty 30, 30d supply, fill #0

## 2024-04-16 MED ORDER — PERFLUTREN LIPID MICROSPHERE
1.0000 mL | INTRAVENOUS | Status: AC | PRN
  Administered 2024-04-16: 2 mL via INTRAVENOUS

## 2024-04-16 MED ORDER — TECHNETIUM TC 99M TETROFOSMIN IV KIT
10.6000 | PACK | Freq: Once | INTRAVENOUS | Status: AC | PRN
  Administered 2024-04-16: 10.6 via INTRAVENOUS

## 2024-04-16 MED ORDER — TECHNETIUM TC 99M TETROFOSMIN IV KIT
31.0000 | PACK | Freq: Once | INTRAVENOUS | Status: AC | PRN
  Administered 2024-04-16: 31 via INTRAVENOUS

## 2024-04-16 NOTE — Telephone Encounter (Signed)
 Pharmacy Patient Advocate Encounter   Received notification from Pt Calls Messages that prior authorization for REpatha  is required/requested.   Insurance verification completed.   The patient is insured through [facility] ADVANTAGE/RX ADVANCE .   Per test claim: The current 04/16/24 day co-pay is, $45.00 one month.  No PA needed at this time. This test claim was processed through [facility]- copay amounts may vary at other pharmacies due to pharmacy/plan contracts, or as the patient moves through the different stages of their insurance plan.

## 2024-04-16 NOTE — Telephone Encounter (Signed)
 Please complete PA for Repatha /Praluent and Vascepa  2g BID

## 2024-04-16 NOTE — Progress Notes (Signed)
 Patient ID: [name]                 DOB: Nov 01, 1960                    MRN: [id number]     HPI: Barry Gutierrez is a 63 y.o. male patient referred to lipid clinic by Barry Gutierrez. PMH is significant for NSTEMI, CAD, HTN, angina, HLD, and history of alcohol abuse.  Patient presents today to discuss lipid management. Not currently on any lipid lowering medications. Has tried rosuvastatin  and atorvastatin  in the past but both caused myalgias and stomach upset.  Had stress test and echocardiogram this morning.   No longer drinking liquor but continues to drink beer.  Admits his diet is poor mostly due to work schedule. 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 Lives alone so does not cook many meals at home.    Current Medications: Lovaza 2g BID  Intolerances:  Atorvastatin  Rosuvastatin   Risk Factors:  CAD Hx of NSTEMI  LDL goal: <55  Labs: TC 232, Trigs 206, HDL 62, LDL 134 (02/27/24)  Past Medical History:  Diagnosis Date   Allergy    MI (myocardial infarction) (HCC)    Palpitations    Monitor 03/2024: Several patient triggered events with nearly all sinus rhythm/sinus tachycardia (sinus tach 17% burden); no A-fib or VT   S/P angioplasty with stent DES to RCA 11/15/20 11/15/2020    Current Outpatient Medications on File Prior to Visit  Medication Sig Dispense Refill   aspirin  EC 81 MG EC tablet Take 1 tablet (81 mg total) by mouth daily. 90 tablet 3   carvedilol  (COREG ) 12.5 MG tablet Take 1 tablet (12.5 mg total) by mouth 2 (two) times daily with a meal. 180 tablet 3   clopidogrel  (PLAVIX ) 75 MG tablet Take 1 tablet (75 mg total) by mouth daily. (Patient not taking: Reported on 02/27/2024) 30 tablet 2   icosapent  Ethyl (VASCEPA ) 1 g capsule Take 2 capsules (2 g total) by mouth 2 (two) times daily. (Patient not taking: Reported on 02/27/2024) 360 capsule 3   isosorbide  mononitrate (IMDUR ) 30 MG 24 hr tablet  Take 1 tablet (30 mg total) by mouth daily. 90 tablet 3   nitroGLYCERIN  (NITROSTAT ) 0.4 MG SL tablet Place 1 tablet (0.4 mg total) under the tongue every 5 (five) minutes as needed for chest pain. 25 tablet 11   terbinafine  (ATHLETES FOOT, TERBINAFINE ,) 1 % cream Apply 1 Application topically 2 (two) times daily. (Patient not taking: Reported on 02/27/2024) 30 g 0   Vitamin D , Ergocalciferol , (DRISDOL ) 1.25 MG (50000 UNIT) CAPS capsule Take 1 capsule (50,000 Units total) by mouth every 7 (seven) days. (Patient not taking: Reported on 02/27/2024) 4 capsule 2   No current facility-administered medications on file prior to visit.    Allergies  Allergen Reactions   Rosuvastatin      Hungry and nauseous    Assessment/Plan:  1. Hyperlipidemia - Patients last LDL of 134 is above goal of <55. Intolerant to statins and needs extensive lipid lowering. Recommend addition of PCSK9i.    Using demo pen, educated on mechanism of action, storage, site selection, administration and possible adverse reactions. Will complete PA and contact patient with result.  Triglycerides remain above goal despite being on Lovaza. Recommend starting Vascepa  2g BID. Advised triglycerides may also be high due to alcohol, sugar, and processed foods. Recommend  heart healthy diet.  Start Repatha [name] Start Vascepa  2g BID D/C Lovaza Recheck lipid panel in 3 months  Chris Jama Mcmiller, PharmD, BCACP, CDCES, CPP [facility] [address] [place], [place] [address] Phone: [phone]; Fax: [phone] 04/16/2024 12:34 PM

## 2024-04-16 NOTE — Patient Instructions (Signed)
 It was nice meeting you today  We would like your LDL (bad cholesterol) to be less than 55  We would like your triglycerides to be less than 150  I will complete a prior authorization for Repatha  and Vascepa  and let you know the result  Once you start the medication we will recheck your fasting lipid panel in about 3 months. If you are able to tolerate either rosuvastatin  or atorvastatin  we recommend restarting  Let me know if you have any questions  Medford Bolk, PharmD, BCACP, CDCES, CPP [facility] [address], [place], [place] [address] Phone: [phone]; Fax: [phone] 04/16/2024 11:30 AM

## 2024-04-16 NOTE — Telephone Encounter (Signed)
 Pharmacy Patient Advocate Encounter   Received notification from Pt Calls Messages that prior authorization for Vascepa  1 gm BRAND  is required/requested.   Insurance verification completed.   The patient is insured through [facility] ADVANTAGE/RX ADVANCE .   Per test claim: The current 04/16/24 day co-pay is, $10.00- one month.  No PA needed at this time. This test claim was processed through [facility]- copay amounts may vary at other pharmacies due to pharmacy/plan contracts, or as the patient moves through the different stages of their insurance plan.

## 2024-04-19 ENCOUNTER — Ambulatory Visit: Payer: Self-pay | Admitting: Internal Medicine

## 2024-04-19 ENCOUNTER — Other Ambulatory Visit (HOSPITAL_COMMUNITY): Payer: Self-pay

## 2024-04-19 NOTE — Progress Notes (Signed)
 Pt has been made aware of normal result and verbalized understanding.  jw

## 2024-04-23 ENCOUNTER — Other Ambulatory Visit (HOSPITAL_COMMUNITY): Payer: Self-pay

## 2024-04-23 MED ORDER — REPATHA SURECLICK 140 MG/ML ~~LOC~~ SOAJ
1.0000 mL | SUBCUTANEOUS | 5 refills | Status: AC
  Filled 2024-04-23: qty 2, 28d supply, fill #0

## 2024-04-23 MED ORDER — ICOSAPENT ETHYL 1 G PO CAPS
2.0000 g | ORAL_CAPSULE | Freq: Two times a day (BID) | ORAL | 5 refills | Status: AC
  Filled 2024-04-23: qty 120, 30d supply, fill #0

## 2024-04-23 NOTE — Addendum Note (Signed)
 Addended by: DARRELL BRUCKNER on: 04/23/2024 08:15 AM   Modules accepted: Orders

## 2024-04-28 ENCOUNTER — Other Ambulatory Visit (HOSPITAL_COMMUNITY): Payer: Self-pay

## 2024-05-05 ENCOUNTER — Ambulatory Visit: Admitting: Physician Assistant

## 2024-05-12 ENCOUNTER — Encounter: Payer: Self-pay | Admitting: *Deleted

## 2024-05-14 ENCOUNTER — Ambulatory Visit: Admitting: Physician Assistant
